# Patient Record
Sex: Female | Born: 1960 | Race: White | Hispanic: No | Marital: Single | Smoking: Never smoker
Health system: Southern US, Community
[De-identification: ages and names within clinical notes are randomized; demographics above are authoritative.]

## PROBLEM LIST (undated history)

## (undated) DIAGNOSIS — G459 Transient cerebral ischemic attack, unspecified: Secondary | ICD-10-CM

## (undated) DIAGNOSIS — E119 Type 2 diabetes mellitus without complications: Secondary | ICD-10-CM

## (undated) DIAGNOSIS — E785 Hyperlipidemia, unspecified: Secondary | ICD-10-CM

## (undated) DIAGNOSIS — K219 Gastro-esophageal reflux disease without esophagitis: Secondary | ICD-10-CM

## (undated) DIAGNOSIS — F419 Anxiety disorder, unspecified: Secondary | ICD-10-CM

## (undated) DIAGNOSIS — I10 Essential (primary) hypertension: Secondary | ICD-10-CM

## (undated) DIAGNOSIS — F319 Bipolar disorder, unspecified: Secondary | ICD-10-CM

## (undated) HISTORY — DX: Bipolar disorder, unspecified: F31.9

## (undated) HISTORY — DX: Essential (primary) hypertension: I10

## (undated) HISTORY — PX: HERNIA REPAIR: SHX51

## (undated) HISTORY — DX: Type 2 diabetes mellitus without complications: E11.9

## (undated) HISTORY — PX: ABDOMINAL HYSTERECTOMY: SHX81

## (undated) HISTORY — PX: APPENDECTOMY: SHX54

## (undated) HISTORY — PX: RIGHT OOPHORECTOMY: SHX2359

---

## 1999-04-21 ENCOUNTER — Emergency Department (HOSPITAL_COMMUNITY): Admission: EM | Admit: 1999-04-21 | Discharge: 1999-04-21 | Payer: Self-pay | Admitting: Emergency Medicine

## 1999-04-21 ENCOUNTER — Encounter: Payer: Self-pay | Admitting: Emergency Medicine

## 1999-07-17 ENCOUNTER — Emergency Department (HOSPITAL_COMMUNITY): Admission: EM | Admit: 1999-07-17 | Discharge: 1999-07-17 | Payer: Self-pay | Admitting: Emergency Medicine

## 1999-07-17 ENCOUNTER — Encounter: Payer: Self-pay | Admitting: Emergency Medicine

## 2000-01-29 ENCOUNTER — Emergency Department (HOSPITAL_COMMUNITY): Admission: EM | Admit: 2000-01-29 | Discharge: 2000-01-29 | Payer: Self-pay | Admitting: Emergency Medicine

## 2000-01-30 ENCOUNTER — Emergency Department (HOSPITAL_COMMUNITY): Admission: EM | Admit: 2000-01-30 | Discharge: 2000-01-30 | Payer: Self-pay | Admitting: Emergency Medicine

## 2000-02-01 ENCOUNTER — Emergency Department (HOSPITAL_COMMUNITY): Admission: EM | Admit: 2000-02-01 | Discharge: 2000-02-01 | Payer: Self-pay | Admitting: *Deleted

## 2000-02-06 ENCOUNTER — Emergency Department (HOSPITAL_COMMUNITY): Admission: EM | Admit: 2000-02-06 | Discharge: 2000-02-06 | Payer: Self-pay | Admitting: Emergency Medicine

## 2001-01-25 ENCOUNTER — Other Ambulatory Visit: Admission: RE | Admit: 2001-01-25 | Discharge: 2001-01-25 | Payer: Self-pay | Admitting: Obstetrics and Gynecology

## 2001-02-03 ENCOUNTER — Ambulatory Visit (HOSPITAL_COMMUNITY): Admission: RE | Admit: 2001-02-03 | Discharge: 2001-02-03 | Payer: Self-pay | Admitting: Obstetrics and Gynecology

## 2001-05-08 ENCOUNTER — Emergency Department (HOSPITAL_COMMUNITY): Admission: EM | Admit: 2001-05-08 | Discharge: 2001-05-09 | Payer: Self-pay

## 2002-02-18 ENCOUNTER — Emergency Department (HOSPITAL_COMMUNITY): Admission: EM | Admit: 2002-02-18 | Discharge: 2002-02-18 | Payer: Self-pay | Admitting: Emergency Medicine

## 2002-02-18 ENCOUNTER — Encounter: Payer: Self-pay | Admitting: Emergency Medicine

## 2002-12-05 ENCOUNTER — Emergency Department (HOSPITAL_COMMUNITY): Admission: EM | Admit: 2002-12-05 | Discharge: 2002-12-05 | Payer: Self-pay | Admitting: Emergency Medicine

## 2003-05-19 ENCOUNTER — Encounter: Payer: Self-pay | Admitting: Emergency Medicine

## 2003-05-19 ENCOUNTER — Emergency Department (HOSPITAL_COMMUNITY): Admission: EM | Admit: 2003-05-19 | Discharge: 2003-05-19 | Payer: Self-pay | Admitting: *Deleted

## 2003-10-11 ENCOUNTER — Inpatient Hospital Stay (HOSPITAL_COMMUNITY): Admission: EM | Admit: 2003-10-11 | Discharge: 2003-10-20 | Payer: Self-pay | Admitting: Psychiatry

## 2003-10-13 ENCOUNTER — Encounter: Payer: Self-pay | Admitting: Family Medicine

## 2003-11-27 ENCOUNTER — Ambulatory Visit (HOSPITAL_COMMUNITY): Admission: RE | Admit: 2003-11-27 | Discharge: 2003-11-27 | Payer: Self-pay | Admitting: Internal Medicine

## 2004-01-10 ENCOUNTER — Ambulatory Visit (HOSPITAL_COMMUNITY): Admission: RE | Admit: 2004-01-10 | Discharge: 2004-01-10 | Payer: Self-pay | Admitting: Internal Medicine

## 2004-08-14 ENCOUNTER — Inpatient Hospital Stay (HOSPITAL_COMMUNITY): Admission: AD | Admit: 2004-08-14 | Discharge: 2004-08-16 | Payer: Self-pay | Admitting: Obstetrics and Gynecology

## 2004-09-02 ENCOUNTER — Emergency Department (HOSPITAL_COMMUNITY): Admission: EM | Admit: 2004-09-02 | Discharge: 2004-09-02 | Payer: Self-pay | Admitting: Emergency Medicine

## 2004-09-25 ENCOUNTER — Ambulatory Visit (HOSPITAL_COMMUNITY): Admission: RE | Admit: 2004-09-25 | Discharge: 2004-09-25 | Payer: Self-pay | Admitting: Family Medicine

## 2005-01-09 ENCOUNTER — Ambulatory Visit: Payer: Self-pay | Admitting: Internal Medicine

## 2005-01-28 ENCOUNTER — Ambulatory Visit: Payer: Self-pay | Admitting: Internal Medicine

## 2005-03-10 ENCOUNTER — Ambulatory Visit: Payer: Self-pay | Admitting: Internal Medicine

## 2005-04-09 ENCOUNTER — Ambulatory Visit: Payer: Self-pay | Admitting: Psychiatry

## 2005-04-09 ENCOUNTER — Inpatient Hospital Stay (HOSPITAL_COMMUNITY): Admission: RE | Admit: 2005-04-09 | Discharge: 2005-04-13 | Payer: Self-pay | Admitting: Psychiatry

## 2005-08-25 ENCOUNTER — Ambulatory Visit: Payer: Self-pay | Admitting: Pulmonary Disease

## 2006-06-25 ENCOUNTER — Ambulatory Visit: Payer: Self-pay | Admitting: Internal Medicine

## 2007-03-15 ENCOUNTER — Inpatient Hospital Stay (HOSPITAL_COMMUNITY): Admission: AD | Admit: 2007-03-15 | Discharge: 2007-03-17 | Payer: Self-pay | Admitting: Cardiology

## 2007-03-15 ENCOUNTER — Ambulatory Visit: Payer: Self-pay | Admitting: Cardiology

## 2007-03-15 ENCOUNTER — Ambulatory Visit: Payer: Self-pay

## 2007-03-16 ENCOUNTER — Ambulatory Visit: Payer: Self-pay | Admitting: Cardiology

## 2007-03-16 ENCOUNTER — Encounter: Payer: Self-pay | Admitting: Cardiology

## 2007-04-06 ENCOUNTER — Ambulatory Visit: Payer: Self-pay | Admitting: Internal Medicine

## 2009-05-21 ENCOUNTER — Ambulatory Visit (HOSPITAL_COMMUNITY): Payer: Self-pay | Admitting: Psychiatry

## 2009-06-06 ENCOUNTER — Ambulatory Visit (HOSPITAL_COMMUNITY): Payer: Self-pay | Admitting: Psychiatry

## 2009-06-15 ENCOUNTER — Ambulatory Visit (HOSPITAL_COMMUNITY): Payer: Self-pay | Admitting: Psychiatry

## 2009-06-20 ENCOUNTER — Ambulatory Visit (HOSPITAL_COMMUNITY): Payer: Self-pay | Admitting: Psychiatry

## 2009-07-06 ENCOUNTER — Ambulatory Visit (HOSPITAL_COMMUNITY): Payer: Self-pay | Admitting: Psychiatry

## 2009-08-23 ENCOUNTER — Ambulatory Visit (HOSPITAL_COMMUNITY): Payer: Self-pay | Admitting: Psychiatry

## 2009-09-17 ENCOUNTER — Ambulatory Visit (HOSPITAL_COMMUNITY): Payer: Self-pay | Admitting: Psychiatry

## 2009-10-24 ENCOUNTER — Ambulatory Visit (HOSPITAL_COMMUNITY): Payer: Self-pay | Admitting: Psychiatry

## 2009-11-21 ENCOUNTER — Ambulatory Visit (HOSPITAL_COMMUNITY): Payer: Self-pay | Admitting: Psychiatry

## 2010-01-23 ENCOUNTER — Ambulatory Visit (HOSPITAL_COMMUNITY): Payer: Self-pay | Admitting: Psychiatry

## 2010-02-13 ENCOUNTER — Ambulatory Visit (HOSPITAL_COMMUNITY): Payer: Self-pay | Admitting: Licensed Clinical Social Worker

## 2010-03-11 ENCOUNTER — Ambulatory Visit (HOSPITAL_COMMUNITY): Payer: Self-pay | Admitting: Licensed Clinical Social Worker

## 2010-03-11 ENCOUNTER — Ambulatory Visit: Payer: Self-pay | Admitting: Occupational Medicine

## 2010-03-11 DIAGNOSIS — E119 Type 2 diabetes mellitus without complications: Secondary | ICD-10-CM | POA: Insufficient documentation

## 2010-03-11 DIAGNOSIS — E785 Hyperlipidemia, unspecified: Secondary | ICD-10-CM | POA: Insufficient documentation

## 2010-03-11 DIAGNOSIS — M799 Soft tissue disorder, unspecified: Secondary | ICD-10-CM | POA: Insufficient documentation

## 2010-03-28 ENCOUNTER — Ambulatory Visit: Payer: Self-pay | Admitting: Family Medicine

## 2010-03-28 DIAGNOSIS — IMO0001 Reserved for inherently not codable concepts without codable children: Secondary | ICD-10-CM | POA: Insufficient documentation

## 2010-04-11 ENCOUNTER — Ambulatory Visit (HOSPITAL_COMMUNITY): Payer: Self-pay | Admitting: Licensed Clinical Social Worker

## 2010-05-13 ENCOUNTER — Ambulatory Visit (HOSPITAL_COMMUNITY): Payer: Self-pay | Admitting: Licensed Clinical Social Worker

## 2010-05-25 ENCOUNTER — Ambulatory Visit: Payer: Self-pay | Admitting: Family Medicine

## 2010-05-25 DIAGNOSIS — S335XXA Sprain of ligaments of lumbar spine, initial encounter: Secondary | ICD-10-CM | POA: Insufficient documentation

## 2010-06-17 ENCOUNTER — Ambulatory Visit: Payer: Self-pay | Admitting: Internal Medicine

## 2010-06-17 DIAGNOSIS — J40 Bronchitis, not specified as acute or chronic: Secondary | ICD-10-CM | POA: Insufficient documentation

## 2010-06-18 ENCOUNTER — Telehealth (INDEPENDENT_AMBULATORY_CARE_PROVIDER_SITE_OTHER): Payer: Self-pay | Admitting: *Deleted

## 2010-06-19 DIAGNOSIS — K219 Gastro-esophageal reflux disease without esophagitis: Secondary | ICD-10-CM | POA: Insufficient documentation

## 2010-06-19 DIAGNOSIS — J309 Allergic rhinitis, unspecified: Secondary | ICD-10-CM | POA: Insufficient documentation

## 2010-06-27 ENCOUNTER — Ambulatory Visit: Payer: Self-pay | Admitting: Emergency Medicine

## 2010-06-27 DIAGNOSIS — R059 Cough, unspecified: Secondary | ICD-10-CM | POA: Insufficient documentation

## 2010-06-27 DIAGNOSIS — R05 Cough: Secondary | ICD-10-CM

## 2010-07-11 ENCOUNTER — Ambulatory Visit (HOSPITAL_COMMUNITY): Payer: Self-pay | Admitting: Licensed Clinical Social Worker

## 2010-08-05 ENCOUNTER — Ambulatory Visit (HOSPITAL_COMMUNITY): Payer: Self-pay | Admitting: Licensed Clinical Social Worker

## 2010-08-26 ENCOUNTER — Ambulatory Visit: Payer: Self-pay | Admitting: Emergency Medicine

## 2010-08-27 ENCOUNTER — Encounter: Payer: Self-pay | Admitting: Emergency Medicine

## 2010-09-10 ENCOUNTER — Ambulatory Visit (HOSPITAL_COMMUNITY): Payer: Self-pay | Admitting: Licensed Clinical Social Worker

## 2010-09-23 ENCOUNTER — Ambulatory Visit: Payer: Self-pay | Admitting: Family Medicine

## 2010-09-23 DIAGNOSIS — M25569 Pain in unspecified knee: Secondary | ICD-10-CM | POA: Insufficient documentation

## 2010-09-23 DIAGNOSIS — M25529 Pain in unspecified elbow: Secondary | ICD-10-CM | POA: Insufficient documentation

## 2010-09-23 DIAGNOSIS — M25519 Pain in unspecified shoulder: Secondary | ICD-10-CM | POA: Insufficient documentation

## 2010-09-23 DIAGNOSIS — M25539 Pain in unspecified wrist: Secondary | ICD-10-CM | POA: Insufficient documentation

## 2010-09-23 DIAGNOSIS — S93409A Sprain of unspecified ligament of unspecified ankle, initial encounter: Secondary | ICD-10-CM | POA: Insufficient documentation

## 2010-09-25 ENCOUNTER — Telehealth (INDEPENDENT_AMBULATORY_CARE_PROVIDER_SITE_OTHER): Payer: Self-pay | Admitting: *Deleted

## 2010-10-09 ENCOUNTER — Ambulatory Visit (HOSPITAL_COMMUNITY): Payer: Self-pay | Admitting: Licensed Clinical Social Worker

## 2010-11-05 ENCOUNTER — Ambulatory Visit (HOSPITAL_COMMUNITY): Payer: Self-pay | Admitting: Licensed Clinical Social Worker

## 2010-12-07 ENCOUNTER — Encounter: Payer: Self-pay | Admitting: Internal Medicine

## 2010-12-07 ENCOUNTER — Encounter: Payer: Self-pay | Admitting: Psychiatry

## 2010-12-08 ENCOUNTER — Encounter: Payer: Self-pay | Admitting: Internal Medicine

## 2010-12-10 ENCOUNTER — Ambulatory Visit (HOSPITAL_COMMUNITY)
Admission: RE | Admit: 2010-12-10 | Discharge: 2010-12-10 | Payer: Self-pay | Source: Home / Self Care | Attending: Licensed Clinical Social Worker | Admitting: Licensed Clinical Social Worker

## 2010-12-19 NOTE — Assessment & Plan Note (Signed)
Summary: LBP x last night rm 3   Vital Signs:  Patient Profile:   50 Years Old Female CC:      LBP x this am Height:     69 inches Weight:      188 pounds O2 Sat:      100 % O2 treatment:    Room Air Temp:     98.8 degrees F oral Pulse rhythm:   regular Resp:     16 per minute (right arm) Cuff size:   regular  Vitals Entered By: Areta Haber CMA (May 25, 2010 12:06 PM)                  Current Allergies: ! MORPHINE SULFATE (MORPHINE SULFATE) ! SULFA ! PERCOCET (OXYCODONE-ACETAMINOPHEN) ! ULTRAM (TRAMADOL HCL) ! DARVOCET ! DARVOCET ! DARVON     History of Present Illness Chief Complaint: LBP x this am History of Present Illness:  Subjective:  While bending over a clothes basket last night, patient felt sudden sharp pain in her mid-lower back when she stood up.  The pain persisted through the night.  It is worse with movement but does not radiate.  No bowel or bladder dysfunction.  No saddle numbness.  No fevers, chills, and sweats.  Current Problems: LUMBAR STRAIN, ACUTE (ICD-847.2) MYALGIA (ICD-729.1) MUSCULOSKELETAL PAIN (ICD-781.99) HYPERLIPIDEMIA (ICD-272.4) DIABETES MELLITUS, TYPE II (ICD-250.00)   Current Meds ACTOS 30 MG TABS (PIOGLITAZONE HCL) once daily XANAX 2 MG TABS (ALPRAZOLAM) 4 times daily LIPITOR 40 MG TABS (ATORVASTATIN CALCIUM) qhs VICODIN ES 7.5-750 MG TABS (HYDROCODONE-ACETAMINOPHEN) 1 q4h prn TRAZODONE HCL 100 MG TABS (TRAZODONE HCL) qhs ATROVENT HFA 17 MCG/ACT AERS (IPRATROPIUM BROMIDE HFA) as needed KETOROLAC TROMETHAMINE 10 MG TABS (KETOROLAC TROMETHAMINE) One by mouth q4 to 6hr as needed (max 4/day) SKELAXIN 800 MG TABS (METAXALONE) 1 by mouth hs  REVIEW OF SYSTEMS Constitutional Symptoms      Denies fever, chills, night sweats, weight loss, weight gain, and fatigue.  Eyes       Denies change in vision, eye pain, eye discharge, glasses, contact lenses, and eye surgery. Ear/Nose/Throat/Mouth       Denies hearing loss/aids,  change in hearing, ear pain, ear discharge, dizziness, frequent runny nose, frequent nose bleeds, sinus problems, sore throat, hoarseness, and tooth pain or bleeding.  Respiratory       Denies dry cough, productive cough, wheezing, shortness of breath, asthma, bronchitis, and emphysema/COPD.  Cardiovascular       Denies murmurs, chest pain, and tires easily with exhertion.    Gastrointestinal       Denies stomach pain, nausea/vomiting, diarrhea, constipation, blood in bowel movements, and indigestion. Genitourniary       Denies painful urination, kidney stones, and loss of urinary control. Neurological       Denies paralysis, seizures, and fainting/blackouts. Musculoskeletal       Complains of decreased range of motion.      Denies muscle pain, joint pain, joint stiffness, redness, swelling, muscle weakness, and gout.      Comments: LBP x last night Skin       Denies bruising, unusual mles/lumps or sores, and hair/skin or nail changes.  Psych       Denies mood changes, temper/anger issues, anxiety/stress, speech problems, depression, and sleep problems. Other Comments: Pt states she was trying t get top from bin in her closet, felt pain in her lower back. Pt states she is out of her Vicodin medication for pain, pt states she will be  seeing her PCP on Firday 05/31/10. Pt states she is not being followed by a Pain clinic either.   Past History:  Past Medical History: Last updated: 03/11/2010 Diabetes mellitus, type II Hyperlipidemia  Past Surgical History: Last updated: 03/11/2010 Appendectomy partial hysterectomy  Family History: Last updated: 03/11/2010 none  Social History: Last updated: 03/11/2010 Single-engaged Never Smoked Alcohol use-no Drug use-no Regular exercise-no  Risk Factors: Exercise: no (03/11/2010)  Risk Factors: Smoking Status: never (03/11/2010)   Objective:  No acute distress  Neck:  Supple Lungs:  Clear to auscultation.  Breath sounds are  equal.  Heart:  Regular rate and rhythm without murmurs, rubs, or gallops.  Abdomen:  Nontender without masses or hepatosplenomegaly.  Bowel sounds are present.  No CVA or flank tenderness.   Back:   Can heel/toe walk and squat without difficulty.   Tenderness in the midline and bilateral paraspinous muscles from L3 to Sacral area.  Straight leg raising test is negative.  Sitting knee extension test is negative.  Strength and sensation in the lower extremities is normal.  Patellar and achilles reflexes are normal.  Skin:  No rash Assessment New Problems: LUMBAR STRAIN, ACUTE (ICD-847.2)   Plan New Medications/Changes: SKELAXIN 800 MG TABS (METAXALONE) 1 by mouth hs  #8 (eight) x 0, 05/25/2010, Donna Christen MD KETOROLAC TROMETHAMINE 10 MG TABS (KETOROLAC TROMETHAMINE) One by mouth q4 to 6hr as needed (max 4/day)  #16 x 0, 05/25/2010, Donna Christen MD CYCLOBENZAPRINE HCL 10 MG TABS (CYCLOBENZAPRINE HCL) One tab by mouth HS prn  #7 (seven) x 0, 05/25/2010, Donna Christen MD KETOROLAC TROMETHAMINE 10 MG TABS (KETOROLAC TROMETHAMINE) One by mouth q4 to 6hr as needed (max 4/day)  #12 (twelve) x 0, 05/25/2010, Donna Christen MD  New Orders: Ketorolac-Toradol 15mg  [Z6109] Admin of Therapeutic Inj  intramuscular or subcutaneous [96372] Est. Patient Level III [[address]] Planning Comments:   Begin applying ice pack several times daily. Toradol 60mg  IM.  May continue oral Toradol for 4 more days.  Skelaxin at bedtime. Begin back range of motion exercises ([facility] information and instruction patient handout given)  Follow-up with PCP as scheduled.   The patient and/or caregiver has been counseled thoroughly with regard to medications prescribed including dosage, schedule, interactions, rationale for use, and possible side effects and they verbalize understanding.  Diagnoses and expected course of recovery discussed and will return if not improved as expected or if the condition worsens. Patient and/or  caregiver verbalized understanding.  Prescriptions: SKELAXIN 800 MG TABS (METAXALONE) 1 by mouth hs  #8 (eight) x 0   Entered and Authorized by:   Donna Christen MD   Signed by:   Donna Christen MD on 05/25/2010   Method used:   Print then Give to Patient   RxID:   [id number] KETOROLAC TROMETHAMINE 10 MG TABS (KETOROLAC TROMETHAMINE) One by mouth q4 to 6hr as needed (max 4/day)  #16 x 0   Entered and Authorized by:   Donna Christen MD   Signed by:   Donna Christen MD on 05/25/2010   Method used:   Print then Give to Patient   RxID:   [id number] CYCLOBENZAPRINE HCL 10 MG TABS (CYCLOBENZAPRINE HCL) One tab by mouth HS prn  #7 (seven) x 0   Entered and Authorized by:   Donna Christen MD   Signed by:   Donna Christen MD on 05/25/2010   Method used:   Print then Give to Patient   RxID:   [id number] KETOROLAC TROMETHAMINE 10 MG TABS (KETOROLAC TROMETHAMINE)  One by mouth q4 to 6hr as needed (max 4/day)  #12 (twelve) x 0   Entered and Authorized by:   Donna Christen MD   Signed by:   Donna Christen MD on 05/25/2010   Method used:   Print then Give to Patient   RxID:   [id number]   Medication Administration  Injection # 1:    Medication: Ketorolac-Toradol 15mg     Diagnosis: LUMBAR STRAIN, ACUTE (ICD-847.2)    Route: IM    Site: RUOQ gluteus    Exp Date: 08/18/2011    Lot #: [id number]    Mfr: Hospira    Comments: Administered 60 mg    Patient tolerated injection without complications    Given by: Areta Haber CMA (May 25, 2010 12:53 PM)  Orders Added: 1)  Ketorolac-Toradol 15mg  [J1885] 2)  Admin of Therapeutic Inj  intramuscular or subcutaneous [96372] 3)  Est. Patient Level III [62130]   Medication Administration  Injection # 1:    Medication: Ketorolac-Toradol 15mg     Diagnosis: LUMBAR STRAIN, ACUTE (ICD-847.2)    Route: IM    Site: RUOQ gluteus    Exp Date: 08/18/2011    Lot #: [id number]    Mfr: Hospira    Comments: Administered 60 mg    Patient  tolerated injection without complications    Given by: Areta Haber CMA (May 25, 2010 12:53 PM)  Orders Added: 1)  Ketorolac-Toradol 15mg  [J1885] 2)  Admin of Therapeutic Inj  intramuscular or subcutaneous [96372] 3)  Est. Patient Level III [96295]

## 2010-12-19 NOTE — Assessment & Plan Note (Signed)
Summary: BACK PAIN   Vital Signs:  Patient Profile:   50 Years Old Female CC:      lower back and left extremities pain, HA, N/V post fall yesterday  Height:     69 inches Weight:      160 pounds O2 Sat:      97 % O2 treatment:    Room Air Temp:     97.2 degrees F oral Pulse rate:   65 / minute Pulse rhythm:   regular Resp:     18 per minute BP sitting:   113 / 74  (left arm) Cuff size:   regular  Pt. in pain?   yes    Location:   lower back and left extremities    Intensity:   10    Type:       sharp  Vitals Entered By: Lajean Saver RN (March 11, 2010 3:14 PM)                   Updated Prior Medication List: ACTOS 30 MG TABS (PIOGLITAZONE HCL) once daily XANAX 2 MG TABS (ALPRAZOLAM) 4 times daily LIPITOR 40 MG TABS (ATORVASTATIN CALCIUM) qhs VICODIN ES 7.5-750 MG TABS (HYDROCODONE-ACETAMINOPHEN) 1 q4h prn PHENERGAN 25 MG/ML SOLN (PROMETHAZINE HCL) prn TRAZODONE HCL 100 MG TABS (TRAZODONE HCL) qhs  Current Allergies: ! MORPHINE SULFATE (MORPHINE SULFATE) ! SULFA ! PERCOCET (OXYCODONE-ACETAMINOPHEN) ! ULTRAM (TRAMADOL HCL) ! DARVOCET ! DARVOCETHistory of Present Illness Chief Complaint: Larey Seat yesterday  History of Present Illness: Yesterday she accidentally fell backwards on a gravel driveway.  Landed on her left side.   No history of head injury or trauma.   Presents with Complaints of mid and low back pain.  I had her fill out a PAIN DIAGRAM and it it attached to the visit.   she indicates she has pain in her left left leg to the ankle and left hip and left arm from shoulder to elbow.    She has been taking ibuprofen 800mg  that she had at home.  This upset her stomach.   Hot showers feel good.  Denies history of fibromyalgia or chronic back pain or other chronic pain.    REVIEW OF SYSTEMS Constitutional Symptoms      Denies fever, chills, night sweats, weight loss, weight gain, and fatigue.  Eyes       Denies change in vision, eye pain, eye discharge, glasses,  contact lenses, and eye surgery. Ear/Nose/Throat/Mouth       Denies hearing loss/aids, change in hearing, ear pain, ear discharge, dizziness, frequent runny nose, frequent nose bleeds, sinus problems, sore throat, hoarseness, and tooth pain or bleeding.  Respiratory       Denies dry cough, productive cough, wheezing, shortness of breath, asthma, bronchitis, and emphysema/COPD.  Cardiovascular       Denies murmurs, chest pain, and tires easily with exhertion.    Gastrointestinal       Complains of nausea/vomiting.      Denies stomach pain, diarrhea, constipation, blood in bowel movements, and indigestion.      Comments: X last night Genitourniary       Denies painful urination, kidney stones, and loss of urinary control. Neurological       Complains of headaches.      Denies paralysis, seizures, and fainting/blackouts. Musculoskeletal       Complains of muscle pain and joint stiffness.      Denies joint pain, decreased range of motion, redness, swelling, muscle weakness, and gout.  Comments: lower back, left extremities Skin       Denies bruising, unusual mles/lumps or sores, and hair/skin or nail changes.  Psych       Denies mood changes, temper/anger issues, anxiety/stress, speech problems, depression, and sleep problems. Other Comments: patient was getting objects out of a trunk yesterday, stepped back and lost her balance falling. She landed on her left side and hitting the left side of her face.   Past History:  Past Medical History: Diabetes mellitus, type II Hyperlipidemia  Past Surgical History: Appendectomy partial hysterectomy  Family History: none  Social History: Single-engaged Never Smoked Alcohol use-no Drug use-no Regular exercise-no Smoking Status:  never Drug Use:  no Does Patient Exercise:  no Physical Exam General appearance: Sad, dysphoric affect.   Walks with a slow shuffling gait.   Very awkward transitions.  Back: tender musculature bilateral  lower back, straight leg raises negative bilaterally, deep tendon reflexes 2+ at achilles and patella Many pain behaviors noted.  No bruising noted on entire back, buttocks, legs and arms tender bilateral traps, upper back, mid back and lower back.  Assessment New Problems: MUSCULOSKELETAL PAIN (ICD-781.99) HYPERLIPIDEMIA (ICD-272.4) DIABETES MELLITUS, TYPE II (ICD-250.00)   Plan New Medications/Changes: PROMETHAZINE HCL 25 MG  TABS (PROMETHAZINE HCL) one every 6 hours as needed for nausea  #15 x 0, 03/11/2010, Kathrine Haddock MD CYCLOBENZAPRINE HCL 10 MG  TABS (CYCLOBENZAPRINE HCL) 1 by mouth 2 times daily as needed for back pain  #20 x 0, 03/11/2010, Kathrine Haddock MD MELOXICAM 15 MG TABS (MELOXICAM) one by mouth daily  #30 x 0, 03/11/2010, Kathrine Haddock MD  New Orders: New Patient Level III [phone] Planning Comments:   Recommend maintaining normal activity as much as possible\par No indications for imaging now Gave Toradol 60mg  IM now and 4 mg zofran by mouth Phenegran as needed for nausea mobic daily as directed flexeril as directed Follow up with PCP in the next 1-2 weeks.   The patient and/or caregiver has been counseled thoroughly with regard to medications prescribed including dosage, schedule, interactions, rationale for use, and possible side effects and they verbalize understanding.  Diagnoses and expected course of recovery discussed and will return if not improved as expected or if the condition worsens. Patient and/or caregiver verbalized understanding.  Prescriptions: PROMETHAZINE HCL 25 MG  TABS (PROMETHAZINE HCL) one every 6 hours as needed for nausea  #15 x 0   Entered and Authorized by:   Kathrine Haddock MD   Signed by:   Kathrine Haddock MD on 03/11/2010   Method used:   Print then Give to Patient   RxID:   [id number] CYCLOBENZAPRINE HCL 10 MG  TABS (CYCLOBENZAPRINE HCL) 1 by mouth 2 times daily as needed for back pain  #20 x 0   Entered and Authorized by:    Kathrine Haddock MD   Signed by:   Kathrine Haddock MD on 03/11/2010   Method used:   Print then Give to Patient   RxID:   [id number] MELOXICAM 15 MG TABS (MELOXICAM) one by mouth daily  #30 x 0   Entered and Authorized by:   Kathrine Haddock MD   Signed by:   Kathrine Haddock MD on 03/11/2010   Method used:   Print then Give to Patient   RxID:   [id number]   Appended Document: BACK PAIN 60mg  Ketorolac given right glute LOT# [id number] Exp: 06/18/2011 Patient tolerated without complications

## 2010-12-19 NOTE — Assessment & Plan Note (Signed)
Summary: coughing/vommiting   Vital Signs:  Patient Profile:   50 Years Old Female CC:      Cough, vomiting, sneezing, sore throat x  5 days Height:     69 inches Weight:      182 pounds O2 Sat:      96 % O2 treatment:    Room Air Temp:     98.9 degrees F oral Pulse rate:   65 / minute Pulse rhythm:   regular Resp:     20 per minute BP sitting:   112 / 74  (right arm) Cuff size:   regular  Vitals Entered By: Emilio Math (June 27, 2010 11:05 AM)                  Current Allergies (reviewed today): ! MORPHINE SULFATE (MORPHINE SULFATE) ! SULFA ! PERCOCET (OXYCODONE-ACETAMINOPHEN) ! ULTRAM (TRAMADOL HCL) ! DARVOCET ! DARVON ! * PEPTO ! * XRAY DYES ! CIPRO ! * LORCETHistory of Present Illness Chief Complaint: Cough, vomiting, sneezing, sore throat x  5 days History of Present Illness: 50 yo F here for URI symptoms.  Patient reports on 8/5 she had esophageal dilatation procedure for stricture.  About that time started to develop a cough, fatigue, runny nose, postnasal drip, sore throat.  Has had some posttussive emesis as well.  No blood in vomitus.  Tried benadryl only.  Feels more short of breath.  No chest pain currently.  Sometimes hurts to take a deep breath.  Current Meds ACTOS 30 MG TABS (PIOGLITAZONE HCL) once daily XANAX 2 MG TABS (ALPRAZOLAM) 4 times daily LIPITOR 40 MG TABS (ATORVASTATIN CALCIUM) qhs VICODIN ES 7.5-750 MG TABS (HYDROCODONE-ACETAMINOPHEN) 1 q4h prn TRAZODONE HCL 100 MG TABS (TRAZODONE HCL) qhs LIDODERM 5 % PTCH (LIDOCAINE) 2 applied at night FUROSEMIDE 40 MG TABS (FUROSEMIDE) take 1 by mouth as needed edema PREVACID 30 MG CPDR (LANSOPRAZOLE) take 1 by mouth once daily PROMETHAZINE HCL 25 MG TABS (PROMETHAZINE HCL) take 1 by mouth every 6 hours as needed METFORMIN HCL 500 MG TABS (METFORMIN HCL) take 1 by mouth fi sugar is over 150 DULERA 200-5 MCG/ACT AERO (MOMETASONE FURO-FORMOTEROL FUM) 2 puffs and rinse mouth two times a day TESSALON  PERLES 100 MG CAPS (BENZONATATE) 1 cap by mouth three times a day as needed cough AZITHROMYCIN 250 MG TABS (AZITHROMYCIN) 2 tabs by mouth day 1, 1 tab by mouth days 2-5  REVIEW OF SYSTEMS Constitutional Symptoms       Complains of fever, chills, and night sweats.     Denies weight loss, weight gain, and fatigue.  Eyes       Complains of glasses.      Denies change in vision, eye pain, eye discharge, contact lenses, and eye surgery. Ear/Nose/Throat/Mouth       Complains of frequent runny nose, sinus problems, and sore throat.      Denies hearing loss/aids, change in hearing, ear pain, ear discharge, dizziness, frequent nose bleeds, hoarseness, and tooth pain or bleeding.  Respiratory       Complains of productive cough, wheezing, shortness of breath, asthma, and bronchitis.      Denies dry cough and emphysema/COPD.  Cardiovascular       Denies murmurs, chest pain, and tires easily with exhertion.    Gastrointestinal       Complains of nausea/vomiting.      Denies stomach pain, diarrhea, constipation, blood in bowel movements, and indigestion. Genitourniary       Denies painful urination,  kidney stones, and loss of urinary control. Neurological       Complains of headaches.      Denies paralysis, seizures, and fainting/blackouts. Musculoskeletal       Denies muscle pain, joint pain, joint stiffness, decreased range of motion, redness, swelling, muscle weakness, and gout.  Skin       Denies bruising, unusual mles/lumps or sores, and hair/skin or nail changes.      Comments: Right wrist x 5 days Psych       Denies mood changes, temper/anger issues, anxiety/stress, speech problems, depression, and sleep problems.  Past History:  Past Medical History: Reviewed history from 06/17/2010 and no changes required. Diabetes mellitus, type II Hyperlipidemia Bipolar Hx Major Depression Allergic Rhinitis Bronchitis G [facility] Musculoskeletal pain Irregular heart beat  Past Surgical  History: Appendectomy partial hysterectomy, adhesions - multiple surgeries Mouth  Family History: Reviewed history from 06/17/2010 and no changes required. Family Hx: asthma, heart disease,cancer Mother- died 66, COPD, DM Father- died colon cancer [place]- died age 4  Social History: Reviewed history from 06/17/2010 and no changes required. Single-engaged. Children grown Never Smoked Alcohol use-no Drug use-no Regular exercise-no Disability Physical Exam General appearance: well developed, well nourished, no acute distress Head: normocephalic, atraumatic, TTP bilateral frontal and maxillary sinuses Eyes: conjunctivae and lids normal Ears: normal, no lesions or deformities Nasal: clear discharge Oral/Pharynx: tongue normal, posterior pharynx without erythema or exudate Neck: neck supple,  trachea midline, no masses.  Tender ant cerv chain but no overt LAN. Chest/Lungs: mild rhonchi LUL.  No wheezes, rales.  No resp distress - speaking in sentences. Heart: regular rate and  rhythm, no murmur Skin: no obvious rashes or lesions Assessment New Problems: COUGH (ICD-786.2)   Patient Education: seen by Dr. Pearletha Forge  Plan New Medications/Changes: PROMETHAZINE HCL 25 MG TABS (PROMETHAZINE HCL) take 1 by mouth every 6 hours as needed  #30 x 0, 06/27/2010, Norton Blizzard MD AZITHROMYCIN 250 MG TABS (AZITHROMYCIN) 2 tabs by mouth day 1, 1 tab by mouth days 2-5  #6 x 0, 06/27/2010, Norton Blizzard MD TESSALON PERLES 100 MG CAPS (BENZONATATE) 1 cap by mouth three times a day as needed cough  #30 x 0, 06/27/2010, Norton Blizzard MD  New Orders: T-Chest x-ray, 2 views [[id number]] Est. Patient Level III [16109] Planning Comments:   Discussed likely viral but cover with bacterial causes for sinus and bronchitis with azithromycin.  Tessalon as needed cough.  Phenergan as needed nausea.  Call GI physician given her vomiting - believe this is related to her current illness and not related to  dilatation procedure.  F/u with PCP if not improving over next week.   The patient and/or caregiver has been counseled thoroughly with regard to medications prescribed including dosage, schedule, interactions, rationale for use, and possible side effects and they verbalize understanding.  Diagnoses and expected course of recovery discussed and will return if not improved as expected or if the condition worsens. Patient and/or caregiver verbalized understanding.  Prescriptions: PROMETHAZINE HCL 25 MG TABS (PROMETHAZINE HCL) take 1 by mouth every 6 hours as needed  #30 x 0   Entered and Authorized by:   Norton Blizzard MD   Signed by:   Norton Blizzard MD on 06/27/2010   Method used:   Electronically to        [facility]* (retail)       [address] [place], [place]  [address]  Ph: [phone]       Fax: [phone]   RxID:   [id number] AZITHROMYCIN 250 MG TABS (AZITHROMYCIN) 2 tabs by mouth day 1, 1 tab by mouth days 2-5  #6 x 0   Entered by:   Norton Blizzard MD   Authorized by:   Hoyt Koch MD   Signed by:   Norton Blizzard MD on 06/27/2010   Method used:   Electronically to        [facility]* (retail)       [address] [place], [place]  [address]       Ph: [phone]       Fax: [phone]   RxID:   [id number] TESSALON PERLES 100 MG CAPS (BENZONATATE) 1 cap by mouth three times a day as needed cough  #30 x 0   Entered by:   Norton Blizzard MD   Authorized by:   Hoyt Koch MD   Signed by:   Norton Blizzard MD on 06/27/2010   Method used:   Electronically to        [facility]* (retail)       [address] [place], [place]  [address]       Ph: [phone]       Fax: [phone]   RxID:   [id number]   Patient Instructions: 1)  Take full course of antibiotic until gone. 2)  Take tessalon perles as needed for cough. 3)  Take phenergan as needed for nausea. 4)  Call your GI doctor today (the one who stretched your  esophagus) to notify them you have been vomiting. 5)  Sudafed 60mg  every 6 hours, afrin nasal spray twice a day MAX 3 days, nasal saline rinses, and a humidifier may help with congestion and nasal drip. 6)  Follow up with your doctor if not improving over the next week.  Orders Added: 1)  T-Chest x-ray, 2 views [71020] 2)  Est. Patient Level III [62831]

## 2010-12-19 NOTE — Letter (Signed)
Summary: CONTOLLED MED PRESCIPTION POLICY  CONTOLLED MED PRESCIPTION POLICY   Imported By: Dannette Barbara 08/26/2010 10:21:38  _____________________________________________________________________  External Attachment:    Type:   Image     Comment:   External Document

## 2010-12-19 NOTE — Progress Notes (Signed)
  Phone Note Outgoing Call Call back at [facility] Phone [phone]   Call placed by: Lajean Saver RN,  September 25, 2010 12:34 PM Call placed to: Patient Summary of Call: Callback: No answer. Message left.

## 2010-12-19 NOTE — Assessment & Plan Note (Signed)
Summary: HEAD PAIN/BACK PAIN   Vital Signs:  Patient Profile:   50 Years Old Female CC:      vomitng X 2 days, back, shoulder, neck and bilateral leg painpost fall yesterday Height:     69 inches Weight:      177 pounds O2 Sat:      97 % O2 treatment:    Room Air Temp:     97.0 degrees F oral Pulse rate:   77 / minute Pulse rhythm:   regular Resp:     18 per minute BP sitting:   102 / 60  (right arm) Cuff size:   large  Pt. in pain?   yes    Location:   back and shoulders, legs    Intensity:   10    Type:       sharp  Vitals Entered By: Lajean Saver RN (Mar 28, 2010 2:14 PM)                   Updated Prior Medication List: ACTOS 30 MG TABS (PIOGLITAZONE HCL) once daily XANAX 2 MG TABS (ALPRAZOLAM) 4 times daily LIPITOR 40 MG TABS (ATORVASTATIN CALCIUM) qhs VICODIN ES 7.5-750 MG TABS (HYDROCODONE-ACETAMINOPHEN) 1 q4h prn PHENERGAN 25 MG/ML SOLN (PROMETHAZINE HCL) prn TRAZODONE HCL 100 MG TABS (TRAZODONE HCL) qhs CYCLOBENZAPRINE HCL 10 MG  TABS (CYCLOBENZAPRINE HCL) 1 by mouth 2 times daily as needed for back pain  Current Allergies (reviewed today): ! MORPHINE SULFATE (MORPHINE SULFATE) ! SULFA ! PERCOCET (OXYCODONE-ACETAMINOPHEN) ! ULTRAM (TRAMADOL HCL) ! DARVOCET ! DARVOCET  History of Present Illness Chief Complaint: vomitng X 2 days, back, shoulder, neck and bilateral leg painpost fall yesterday History of Present Illness: MULTIPLE COMPLAINTS. PATIENT STATES FOR 2 DAYS SHE HAS HAD NAUSEA WITH SOME VOMIITING. NO DIARRHEA, NO FEVER, NO BLOOD IN HER STOOLS OR EMISIS. STATES YESTERDAY SHE WAS TRYING TO GET SOMETHING OUT OF HER CLOSET AND LOST HER BALANCE. SHE INITIALLY FELL BACKWARDS AND HIT SOME STORAGE BEND AND THEN WENT FORWARD ONTO HER KNEES. AS SHE WAS GETTING UP FROM HER KNEES SHE FELL BACK AND LANDED ON HER BUTTOCKS. DENIES HEAD INJURY OR LOC. TODAYS SHE STATES SHE IS SORE ALL OVER. HAS SOME SHARP PAIN AT HER COCCYS WHICH SHE STATES SHE HAS BROKEN IN THE PAST.  STATES SHE CHECKED HER BLOOD SUGAR THIS AM AND IT WAS 147.   REVIEW OF SYSTEMS Constitutional Symptoms      Denies fever, chills, night sweats, weight loss, weight gain, and fatigue.  Eyes       Denies change in vision, eye pain, eye discharge, glasses, contact lenses, and eye surgery. Ear/Nose/Throat/Mouth       Denies hearing loss/aids, change in hearing, ear pain, ear discharge, dizziness, frequent runny nose, frequent nose bleeds, sinus problems, sore throat, hoarseness, and tooth pain or bleeding.  Respiratory       Denies dry cough, productive cough, wheezing, shortness of breath, asthma, bronchitis, and emphysema/COPD.  Cardiovascular       Denies murmurs, chest pain, and tires easily with exhertion.    Gastrointestinal       Complains of nausea/vomiting.      Denies stomach pain, diarrhea, constipation, blood in bowel movements, and indigestion.      Comments: X 2 days Genitourniary       Denies painful urination, kidney stones, and loss of urinary control. Neurological       Denies paralysis, seizures, and fainting/blackouts. Musculoskeletal       Complains  of muscle pain, joint pain, and joint stiffness.      Denies decreased range of motion, redness, swelling, muscle weakness, and gout.      Comments: back, neck, legs, shoulders Skin       Denies bruising, unusual mles/lumps or sores, and hair/skin or nail changes.  Psych       Denies mood changes, temper/anger issues, anxiety/stress, speech problems, depression, and sleep problems. Other Comments: patient states she lost her balance while in her closet yesterday and fell backwards hitting her knees first and then her buttocks   Past History:  Past Medical History: Reviewed history from 03/11/2010 and no changes required. Diabetes mellitus, type II Hyperlipidemia  Past Surgical History: Reviewed history from 03/11/2010 and no changes required. Appendectomy partial hysterectomy  Family History: Reviewed history  from 03/11/2010 and no changes required. none  Social History: Reviewed history from 03/11/2010 and no changes required. Single-engaged Never Smoked Alcohol use-no Drug use-no Regular exercise-no Physical Exam General appearance: well developed, well nourished, no acute distress. SPEECH SLOW. APPEARS SEDATED. TAKING 5 ZANAX PER DAY Head: normocephalic, atraumatic Neck: neck supple,  trachea midline, no masses Chest/Lungs: no rales, wheezes, or rhonchi bilateral, breath sounds equal without effort Heart: regular rate and  rhythm, no murmur Abdomen: soft, non-tender without obvious organomegaly Extremities: normal extremities Neurological: grossly intact and non-focal. CN 2-12 INTACT.  Back: SKIN CLEAR, NO DEFORMITY. NO BRUISING. ROM INTACT. NEG SLR, SENSORY INTACT.  Skin: no obvious rashes or lesions Assessment New Problems: MYALGIA (ICD-729.1)   Plan New Medications/Changes: ZOFRAN ODT 4 MG TBDP (ONDANSETRON) 1 by mouth Q 8 HRS as needed N/V  ##12 x 0, 03/28/2010, Marvis Moeller DO KETOROLAC TROMETHAMINE 10 MG TABS (KETOROLAC TROMETHAMINE) 1 by mouth QID as needed PAIN  ##20 x 0, 03/28/2010, Marvis Moeller DO  New Orders: Est. Patient Level III [99213] Ketorolac-Toradol 15mg  [J1885] Admin of Therapeutic Inj  intramuscular or subcutaneous [96372] Zofran 1mg . injection [J2405]   Prescriptions: ZOFRAN ODT 4 MG TBDP (ONDANSETRON) 1 by mouth Q 8 HRS as needed N/V  ##12 x 0   Entered and Authorized by:   Marvis Moeller DO   Signed by:   Marvis Moeller DO on 03/28/2010   Method used:   Print then Give to Patient   RxID:   [id number] KETOROLAC TROMETHAMINE 10 MG TABS (KETOROLAC TROMETHAMINE) 1 by mouth QID as needed PAIN  ##20 x 0   Entered and Authorized by:   Marvis Moeller DO   Signed by:   Marvis Moeller DO on 03/28/2010   Method used:   Print then Give to Patient   RxID:   [id number]   Patient Instructions: 1)  YOU WILL BE MORE SORE THE NEXT 48-72  HRS. APPLY HEAT THREE TIMES DAILY FOR 15 MIN. DIET AS DISCUSSED. AVOID CAFFEINE AND MILK PRODUCTS. MONITOR YOUR BLOOD SUGARS. FOLLOW UP WITH YOUR PCP IF SYMPTOMS PERSIST OR WORSEN.   Medication Administration  Injection # 1:    Medication: Ketorolac-Toradol 15mg     Diagnosis: MUSCULOSKELETAL PAIN (ICD-781.99)    Route: IM    Site: LUOQ gluteus    Exp Date: 02/15/2011    Lot #: [id number]    Mfr: [email or web address]    Comments: 30mg     Patient tolerated injection without complications    Given by: Lajean Saver RN (Mar 28, 2010 3:07 PM)  Injection # 2:    Medication: Zofran 1mg . injection    Diagnosis: MUSCULOSKELETAL PAIN (ICD-781.99)    Route: IM    Site: RUOQ  gluteus    Exp Date: 12/18/2011    Lot #: [id number]    Mfr: [email or web address]    Comments: 4mg  given    Patient tolerated injection without complications    Given by: Lajean Saver RN (Mar 28, 2010 3:09 PM)  Orders Added: 1)  Est. Patient Level III [99213] 2)  Ketorolac-Toradol 15mg  [J1885] 3)  Admin of Therapeutic Inj  intramuscular or subcutaneous [96372] 4)  Zofran 1mg . injection [J2405]

## 2010-12-19 NOTE — Progress Notes (Signed)
Summary: PA on dulera 200-92mcg - LMTCB x 1  Phone Note Outgoing Call   Call placed by: Boone Master CNA/MA,  June 18, 2010 4:03 PM Call placed to: [facility] Summary of Call: recieved PA for [facility] 200-40mcg.  called [facility], was told that advair diskus and hfa are tier II and also flovent diskus and hfa.  Dr. Maple Hudson, may pt be switched to something covered or would you like a PA done?    pt id # [id number] Initial call taken by: Boone Master CNA/MA,  June 18, 2010 4:06 PM  Follow-up for Phone Call        My intention was that she try the [facility] first- use up the sample and see how that type of med works before she immediately starts getting scripts filled. The [facility] sample has enough in it to last for a while. Follow-up by: Waymon Budge MD,  June 18, 2010 8:17 PM  Additional Follow-up for Phone Call Additional follow up Details #1::        ATC pt - [facility] Yetta Barre RN  June 19, 2010 11:59 AM     Additional Follow-up for Phone Call Additional follow up Details #2::    Pt states that [facility] seems to be working good.  PT has appt to f/u with Dr Maple Hudson on 07-18-10.  Pt given another sample of Dulera to last until ov and then she can discuss inusrance issues at that time. Abigail Miyamoto RN  June 20, 2010 10:29 AM

## 2010-12-19 NOTE — Assessment & Plan Note (Signed)
Summary: sob/former pt/apc   Primary Provider/Referring Provider:  Dalbert Mayotte, MD/ Kathryne Sharper  CC:  Re establish former pt last seen 04-07-07/SOB.Marland Kitchen  History of Present Illness:   04/07/07-PROBLEMS: 1. Asthmatic bronchitis. 2. Dyspnea. 3. Anxiety with bipolar disorder. 4. Esophageal reflux. 5. Allergic rhinitis.   HISTORY:  I had seen her almost a year ago. She was hospitalized on April 28-30 by Dr. Riley Kill for cardiology to rule out MI with chest pain. Workup was negative. She also had lower abdominal pain and discharge diagnoses were chest pain and hypokalemia with low abdominal and pelvic pain, history of major depressive disorder, history of opiate dependence, status post appendectomy, status post laparoscopic surgery for abdominal and pelvic adhesions with procedures including transvaginal pelvic ultrasound and 2D echocardiogram. She had presented describing a 2-3 month history of chest tightness and shortness of breath. An adenosine study had shown an EF of 73% without evidence of ischemia, although there were some ST abnormalities on EKG.   June 17, 2010- Asthmatic bronchitis, hx bipolar disorder, GERD, allergiuc rhinitis.....Marland Kitchenfiance  here Parents smoked heavily- passive exposure. Gradually worse DOE, maybe especialy in last 3 months. Cites DOE walking across rooms of home, liming ADLs. Dry cough, some wheeze, daily itch/ sneeze/ watery nose. Not helped by taking 4-5 benadryls/ day. Failed benefit Nasonex, Omnaris, Spiriva. Larey Seat in her home 2 days ago, striking across anterior chest. Painful repiration diffusely across anterior chest.  Positive allergy skin testing 2006. "Real bad" seasonal allergy, rhinitis, nasal congestion and drainage. For esophageal dilatation/ EGD in [place].    Preventive Screening-Counseling & Management  Alcohol-Tobacco     Smoking Status: never     Passive Smoke Exposure: yes     Passive Smoke Counseling: not indicated; no passive  smoke exposure  Comments: No longer exposed.  Current Medications (verified): 1)  Actos 30 Mg Tabs (Pioglitazone Hcl) .... Once Daily 2)  Xanax 2 Mg Tabs (Alprazolam) .... 4 Times Daily 3)  Lipitor 40 Mg Tabs (Atorvastatin Calcium) .... Qhs 4)  Vicodin Es 7.5-750 Mg Tabs (Hydrocodone-Acetaminophen) .Marland Kitchen.. 1 Q4h Prn 5)  Trazodone Hcl 100 Mg Tabs (Trazodone Hcl) .... Qhs 6)  Atrovent Hfa 17 Mcg/act Aers (Ipratropium Bromide Hfa) .... As Needed 7)  Ketorolac Tromethamine 10 Mg Tabs (Ketorolac Tromethamine) .... One By Mouth Q4 To 6hr As Needed (Max 4/day) 8)  Skelaxin 800 Mg Tabs (Metaxalone) .Marland Kitchen.. 1 By Mouth Hs 9)  Omnaris 50 Mcg/act Susp (Ciclesonide) .... 2 Sprays in Each Nostril Once Daily As Needed 10)  Lidoderm 5 % Ptch (Lidocaine) .... 2 Applied At Night 11)  Furosemide 40 Mg Tabs (Furosemide) .... Take 1 By Mouth As Needed Edema 12)  Levothroid 50 Mcg Tabs (Levothyroxine Sodium) .... Take 1 By Mouth Once Daily 13)  Prevacid 30 Mg Cpdr (Lansoprazole) .... Take 1 By Mouth Once Daily 14)  Promethazine Hcl 25 Mg Tabs (Promethazine Hcl) .... Take 1 By Mouth Every 6 Hours As Needed 15)  Metformin Hcl 500 Mg Tabs (Metformin Hcl) .... Take 1 By Mouth Fi Sugar Is Over 150  Allergies (verified): 1)  ! Morphine Sulfate (Morphine Sulfate) 2)  ! Sulfa 3)  ! Percocet (Oxycodone-Acetaminophen) 4)  ! Ultram (Tramadol Hcl) 5)  ! Darvocet 6)  ! Darvon 7)  ! * Pepto 8)  ! * Xray Dyes 9)  ! Cipro 10)  ! * Lorcet  Past History:  Family History: Last updated: 06/17/2010 Family Hx: asthma, heart disease,cancer Mother- died 12, COPD, DM Father- died colon cancer Siuster- died  age 50  Social History: Last updated: 06/17/2010 Single-engaged. Children grown Never Smoked Alcohol use-no Drug use-no Regular exercise-no Disability  Risk Factors: Exercise: no (03/11/2010)  Risk Factors: Smoking Status: never (06/17/2010) Passive Smoke Exposure: yes (06/17/2010)  Past Medical  History: Diabetes mellitus, type II Hyperlipidemia Bipolar Hx Major Depression Allergic Rhinitis Bronchitis G E R D Musculoskeletal pain Irregular heart beat  Past Surgical History: Appendectomy partial hysterectomy, adhesions - multiple surgeries Mouth  Family History: Family Hx: asthma, heart disease,cancer Mother- died 52, COPD, DM Father- died colon cancer Siuster- died age 28  Social History: Single-engaged. Children grown Never Smoked Alcohol use-no Drug use-no Regular exercise-no Disability Passive Smoke Exposure:  yes  Review of Systems      See HPI       The patient complains of shortness of breath with activity, shortness of breath at rest, productive cough, chest pain, irregular heartbeats, acid heartburn, loss of appetite, weight change, difficulty swallowing, headaches, nasal congestion/difficulty breathing through nose, sneezing, itching, anxiety, hand/feet swelling, joint stiffness or pain, and change in color of mucus.  The patient denies non-productive cough, coughing up blood, indigestion, abdominal pain, sore throat, tooth/dental problems, ear ache, depression, rash, and fever.    Vital Signs:  Patient profile:   50 year old female Height:      69 inches Weight:      186.50 pounds BMI:     27.64 O2 Sat:      98 % on Room air Pulse rate:   60 / minute BP sitting:   110 / 72  (left arm) Cuff size:   regular  Vitals Entered By: Reynaldo Minium CMA (June 17, 2010 3:18 PM)  O2 Flow:  Room air CC: Re establish former pt last seen 04-07-07/SOB.    Impression & Recommendations:  Problem # 1:  BRONCHITIS (ICD-490)  We will let her try changing Spiriva to [facility] sample. She has a hx of passive smoking and chroinc bronchits. We will get report of her CXR from [place] and schedule PFT. Her updated medication list for this problem includes:    Atrovent Hfa 17 Mcg/act Aers (Ipratropium bromide hfa) .Marland Kitchen... As needed    Dulera 200-5 Mcg/act Aero  (Mometasone furo-formoterol fum) .Marland Kitchen... 2 puffs and rinse mouth two times a day  Problem # 2:  MUSCULOSKELETAL PAIN (ICD-781.99)  She indicates she is very sore across the front of her chest from her fall and I agreed togive a single script for Tylenol # III to cover til her next primary visit. I wanted to have her able to blow hard for her PFT when scheduled. She was given vicodin 7/14 from her back doctor. Watch for recurrent pain med requests. I told her I would not give refils.  Medications Added to Medication List This Visit: 1)  Omnaris 50 Mcg/act Susp (Ciclesonide) .... 2 sprays in each nostril once daily as needed 2)  Lidoderm 5 % Ptch (Lidocaine) .... 2 applied at night 3)  Furosemide 40 Mg Tabs (Furosemide) .... Take 1 by mouth as needed edema 4)  Levothroid 50 Mcg Tabs (Levothyroxine sodium) .... Take 1 by mouth once daily 5)  Prevacid 30 Mg Cpdr (Lansoprazole) .... Take 1 by mouth once daily 6)  Promethazine Hcl 25 Mg Tabs (Promethazine hcl) .... Take 1 by mouth every 6 hours as needed 7)  Metformin Hcl 500 Mg Tabs (Metformin hcl) .... Take 1 by mouth fi sugar is over 150 8)  Acetaminophen-codeine #3 300-30 Mg Tabs (Acetaminophen-codeine) .Marland KitchenMarland KitchenMarland Kitchen 1  three times a day as needed 9)  Dulera 200-5 Mcg/act Aero (Mometasone furo-formoterol fum) .... 2 puffs and rinse mouth two times a day  Other Orders: Pneumococcal Vaccine (04540) Admin 1st Vaccine (98119) New Patient Level IV (14782)  Patient Instructions: 1)  Please schedule a follow-up appointment in 1 month. 2)  Schedule PFT 3)  Script for tylenol # 3 - use sparingly for pain. I won't be able to refill it. Try a heating pad. 4)  Set the Spiriva aside, since it doesn't seem to help now. Try sample Dulera 200-5, 2 puffs and rinse mouth, twice daily. 5)  We will contact the imaging center in [place] for a copy of your recent xray report. 6)  Pneumovax Prescriptions: DULERA 200-5 MCG/ACT AERO (MOMETASONE FURO-FORMOTEROL FUM) 2  puffs and rinse mouth two times a day  #1 x prn   Entered and Authorized by:   Waymon Budge MD   Signed by:   Waymon Budge MD on 06/17/2010   Method used:   Print then Give to Patient   RxID:   [id number] ACETAMINOPHEN-CODEINE #3 300-30 MG TABS (ACETAMINOPHEN-CODEINE) 1 three times a day as needed  #25 x 0   Entered and Authorized by:   Waymon Budge MD   Signed by:   Waymon Budge MD on 06/17/2010   Method used:   Print then Give to Patient   RxID:   [id number]   Prevention & Chronic Care Immunizations   Influenza vaccine: Not documented    Tetanus booster: Not documented    Pneumococcal vaccine: Pneumovax  (06/17/2010)  Other Screening   Pap smear: Not documented    Mammogram: Not documented   Smoking status: never  (06/17/2010)  Diabetes Mellitus   HgbA1C: Not documented    Eye exam: Not documented    Foot exam: Not documented   High risk foot: Not documented   Foot care education: Not documented    Urine microalbumin/creatinine ratio: Not documented  Lipids   Total Cholesterol: Not documented   LDL: Not documented   LDL Direct: Not documented   HDL: Not documented   Triglycerides: Not documented    SGOT (AST): Not documented   SGPT (ALT): Not documented   Alkaline phosphatase: Not documented   Total bilirubin: Not documented  Self-Management Support :    Diabetes self-management support: Not documented    Lipid self-management support: Not documented     Immunizations Administered:  Pneumonia Vaccine:    Vaccine Type: Pneumovax    Site: right deltoid    Mfr: Merck    Dose: 0.5 ml    Route: IM    Given by: Carver Fila    Exp. Date: 12/04/2011    Lot #: [id number]

## 2010-12-19 NOTE — Letter (Signed)
Summary: AUTH FOR MEDS  AUTH FOR MEDS   Imported By: Dannette Barbara 08/27/2010 09:09:01  _____________________________________________________________________  External Attachment:    Type:   Image     Comment:   External Document

## 2010-12-19 NOTE — Assessment & Plan Note (Signed)
Summary: BACK AND TAILBONE PAIN   Vital Signs:  Patient Profile:   50 Years Old Female CC:      Fell last night in bathroom, vomiting x 2 days Height:     69 inches Weight:      191 pounds O2 Sat:      97 % O2 treatment:    Room Air Temp:     97.5 degrees F oral Pulse rate:   75 / minute Pulse rhythm:   regular Resp:     18 per minute BP sitting:   124 / 76  (left arm) Cuff size:   regular  Pt. in pain?   yes    Location:   lower back    Intensity:   10    Type:       sharp  Vitals Entered By: Emilio Math (August 26, 2010 10:23 AM)                   Current Allergies (reviewed today): ! MORPHINE SULFATE (MORPHINE SULFATE) ! SULFA ! PERCOCET (OXYCODONE-ACETAMINOPHEN) ! ULTRAM (TRAMADOL HCL) ! DARVOCET ! DARVON ! * PEPTO ! * XRAY DYES ! CIPRO ! * LORCETHistory of Present Illness Chief Complaint: Larey Seat last night in bathroom, vomiting x 2 days History of Present Illness: Has vomiting for the past few days with nausea.  Thinks she has a URI starting and since she is coughing, she is now throwing up.  Emesis x1-2 times per day.  +runny nose.  She saw GI 2 months ago for a esophageal dilation procedure and felt fine afterwards.  She states that nausea isn't chronic. Then she fell in bathroom last night. Doesn't know how but maybe tripped over a rug.  Has a lot of back and hip and tailbone pain that radiates everywhere.  No wekness, numbness, saddle anesthesia, or problems goin to the batroom.  She is asking for Demerol or Oxycontin and a muscle relaxant.  She has a lot of pain in the past.  Previously she states that she is allergic to a lot pain medicines including Percocet, Lorcet, and Vicodin, but is taking narcotics as Rx by her PCP.  Current Meds ACTOS 30 MG TABS (PIOGLITAZONE HCL) once daily XANAX 2 MG TABS (ALPRAZOLAM) 4 times daily LIPITOR 40 MG TABS (ATORVASTATIN CALCIUM) qhs VICODIN ES 7.5-750 MG TABS (HYDROCODONE-ACETAMINOPHEN) 1 q4h prn TRAZODONE HCL 100 MG  TABS (TRAZODONE HCL) qhs LIDODERM 5 % PTCH (LIDOCAINE) 2 applied at night FUROSEMIDE 40 MG TABS (FUROSEMIDE) take 1 by mouth as needed edema METFORMIN HCL 500 MG TABS (METFORMIN HCL) take 1 by mouth fi sugar is over 150 DULERA 200-5 MCG/ACT AERO (MOMETASONE FURO-FORMOTEROL FUM) 2 puffs and rinse mouth two times a day NUCYNTA 50 MG TABS (TAPENTADOL HCL) 1 tab by mouth at bedtime as needed for pain PROMETHAZINE HCL 25 MG TABS (PROMETHAZINE HCL) 1 tab by mouth Q6 hours as needed for nausea SKELAXIN 800 MG TABS (METAXALONE) 1 by mouth three times a day as needed for muscle spasms  REVIEW OF SYSTEMS Constitutional Symptoms      Denies fever, chills, night sweats, weight loss, weight gain, and fatigue.  Eyes       Denies change in vision, eye pain, eye discharge, glasses, contact lenses, and eye surgery. Ear/Nose/Throat/Mouth       Denies hearing loss/aids, change in hearing, ear pain, ear discharge, dizziness, frequent runny nose, frequent nose bleeds, sinus problems, sore throat, hoarseness, and tooth pain or bleeding.  Respiratory  Denies dry cough, productive cough, wheezing, shortness of breath, asthma, bronchitis, and emphysema/COPD.  Cardiovascular       Denies murmurs, chest pain, and tires easily with exhertion.    Gastrointestinal       Complains of nausea/vomiting.      Denies stomach pain, diarrhea, constipation, blood in bowel movements, and indigestion. Genitourniary       Denies painful urination, kidney stones, and loss of urinary control. Neurological       Denies paralysis, seizures, and fainting/blackouts. Musculoskeletal       Complains of muscle pain, joint pain, joint stiffness, and decreased range of motion.      Denies redness, swelling, muscle weakness, and gout.  Skin       Denies bruising, unusual mles/lumps or sores, and hair/skin or nail changes.  Psych       Denies mood changes, temper/anger issues, anxiety/stress, speech problems, depression, and sleep  problems.  Past History:  Past Medical History: Reviewed history from 06/17/2010 and no changes required. Diabetes mellitus, type II Hyperlipidemia Bipolar Hx Major Depression Allergic Rhinitis Bronchitis G E R D Musculoskeletal pain Irregular heart beat  Past Surgical History: Reviewed history from 06/27/2010 and no changes required. Appendectomy partial hysterectomy, adhesions - multiple surgeries Mouth  Family History: Reviewed history from 06/17/2010 and no changes required. Family Hx: asthma, heart disease,cancer Mother- died 15, COPD, DM Father- died colon cancer [place]- died age 5  Social History: Reviewed history from 06/17/2010 and no changes required. Single-engaged. Children grown Never Smoked Alcohol use-no Drug use-no Regular exercise-no Disability Physical Exam General appearance: well developed, well nourished, mild distress Head: normocephalic, atraumatic Neurological: grossly intact and non-focal Back: TTP everywhere to light touch, midline doesn't appear to be more painful.  Cannot assess much because she is resistant to examination Skin: bruise on L forearm (3cm) and R leg (4cm) MSE: oriented to time, place, and person.   Assessment I believe she may have fell, but is also seeking pain meds.    Plan New Medications/Changes: SKELAXIN 800 MG TABS (METAXALONE) 1 by mouth three times a day as needed for muscle spasms  #15 x 0, 08/26/2010, Hoyt Koch MD PROMETHAZINE HCL 25 MG TABS (PROMETHAZINE HCL) 1 tab by mouth Q6 hours as needed for nausea  #15 x 0, 08/26/2010, Hoyt Koch MD NUCYNTA 50 MG TABS (TAPENTADOL HCL) 1 tab by mouth at bedtime as needed for pain  #10 x 0, 08/26/2010, Hoyt Koch MD  New Orders: Est. Patient Level III [99213] Ketorolac-Toradol 15mg  [Z6109] Admin of Therapeutic Inj  intramuscular or subcutaneous [96372] Planning Comments:   Patient refuses Xray Looking at the [facility] stateRx list, she is getting  monthly Vicodin 7.5 from her PCP.  Her next appt is next week.  I have told her I will treat the pain with Nucynta 50mg  (if insurance won't pay, then may do something like Ultracet) but she cannot get more pain meds here, especially with all of her medication allergies.  We are not her PCP and she will need to call them to make an appt for further pain meds or for any chronic treatment of medical problems. I have also noticed that she is getting nausea medicines frequently from [place], but she denies ever having nausea prior to this week.  I would advise her to f/u with her GI doctor for further treatment to see if she needs any additional workup.   The patient and/or caregiver has been counseled thoroughly with regard to medications prescribed including dosage,  schedule, interactions, rationale for use, and possible side effects and they verbalize understanding.  Diagnoses and expected course of recovery discussed and will return if not improved as expected or if the condition worsens. Patient and/or caregiver verbalized understanding.  Prescriptions: SKELAXIN 800 MG TABS (METAXALONE) 1 by mouth three times a day as needed for muscle spasms  #15 x 0   Entered and Authorized by:   Hoyt Koch MD   Signed by:   Hoyt Koch MD on 08/26/2010   Method used:   Print then Give to Patient   RxID:   [id number] PROMETHAZINE HCL 25 MG TABS (PROMETHAZINE HCL) 1 tab by mouth Q6 hours as needed for nausea  #15 x 0   Entered and Authorized by:   Hoyt Koch MD   Signed by:   Hoyt Koch MD on 08/26/2010   Method used:   Print then Give to Patient   RxID:   [id number] NUCYNTA 50 MG TABS (TAPENTADOL HCL) 1 tab by mouth at bedtime as needed for pain  #10 x 0   Entered and Authorized by:   Hoyt Koch MD   Signed by:   Hoyt Koch MD on 08/26/2010   Method used:   Print then Give to Patient   RxID:   [id number]   Medication Administration  Injection # 1:     Medication: Ketorolac-Toradol 15mg     Diagnosis: LUMBAR STRAIN, ACUTE (ICD-847.2)    Route: IM    Site: LUOQ gluteus    Exp Date: 09/18/2011    Lot #: [id number]    Mfr: Hospira    Comments: 30 mgs given    Given by: Emilio Math (August 26, 2010 11:02 AM)  Orders Added: 1)  Est. Patient Level III [99213] 2)  Ketorolac-Toradol 15mg  [J1885] 3)  Admin of Therapeutic Inj  intramuscular or subcutaneous [96372]   Medication Administration  Injection # 1:    Medication: Ketorolac-Toradol 15mg     Diagnosis: LUMBAR STRAIN, ACUTE (ICD-847.2)    Route: IM    Site: LUOQ gluteus    Exp Date: 09/18/2011    Lot #: [id number]    Mfr: Hospira    Comments: 30 mgs given    Given by: Emilio Math (August 26, 2010 11:02 AM)  Orders Added: 1)  Est. Patient Level III [01027] 2)  Ketorolac-Toradol 15mg  [J1885] 3)  Admin of Therapeutic Inj  intramuscular or subcutaneous [25366]

## 2010-12-19 NOTE — Letter (Signed)
Summary: CONTROLLED MED RX POLICY  CONTROLLED MED RX POLICY   Imported By: Dannette Barbara 09/23/2010 17:34:51  _____________________________________________________________________  External Attachment:    Type:   Image     Comment:   External Document

## 2010-12-19 NOTE — Assessment & Plan Note (Signed)
Summary: Larey Seat -Knees/legs - B, R hand/shoulder, back x today rm 3   Vital Signs:  Patient Profile:   50 Years Old Female CC:      Fell Knee/leg pain-B, R arm/hand, back/shoulder x today  Height:     69 inches Weight:      178 pounds O2 Sat:      100 % O2 treatment:    Room Air Temp:     97.7 degrees F oral Pulse rate:   77 / minute Pulse rhythm:   regular Resp:     15 per minute BP sitting:   116 / 74  (left arm) Cuff size:   regular  Vitals Entered By: Areta Haber CMA (September 23, 2010 2:03 PM)                  Current Allergies: ! MORPHINE SULFATE (MORPHINE SULFATE) ! SULFA ! PERCOCET (OXYCODONE-ACETAMINOPHEN) ! ULTRAM (TRAMADOL HCL) ! DARVOCET ! DARVON ! * PEPTO ! * XRAY DYES ! CIPRO ! * LORCETHistory of Present Illness Chief Complaint: Fell Knee/leg pain-B, R arm/hand, back/shoulder x today  History of Present Illness:  Subjective:  Patient tripped and fell forward today, breaking her fall with her right hand, but landing on both knees.  She complains of pain in her right shoulder, elbow, and wrist.  Also pain in right anterior knee and her right ankle.  Current Problems: WRIST SPRAIN, RIGHT (ICD-842.00) ANKLE SPRAIN, RIGHT (ICD-845.00) KNEE PAIN, RIGHT, ACUTE (ICD-719.46) ELBOW PAIN, RIGHT (ICD-719.42) WRIST PAIN, RIGHT (ICD-719.43) SHOULDER PAIN, RIGHT (ICD-719.41) COUGH (ICD-786.2) G E R D (ICD-530.81) ALLERGIC RHINITIS (ICD-477.9) BRONCHITIS (ICD-490) LUMBAR STRAIN, ACUTE (ICD-847.2) MYALGIA (ICD-729.1) MUSCULOSKELETAL PAIN (ICD-781.99) HYPERLIPIDEMIA (ICD-272.4) DIABETES MELLITUS, TYPE II (ICD-250.00)   Current Meds ACTOS 30 MG TABS (PIOGLITAZONE HCL) once daily XANAX 2 MG TABS (ALPRAZOLAM) 4 times daily LIPITOR 40 MG TABS (ATORVASTATIN CALCIUM) qhs VICODIN ES 7.5-750 MG TABS (HYDROCODONE-ACETAMINOPHEN) 1 q4h prn TRAZODONE HCL 100 MG TABS (TRAZODONE HCL) qhs LIDODERM 5 % PTCH (LIDOCAINE) 2 applied at night FUROSEMIDE 40 MG TABS  (FUROSEMIDE) take 1 by mouth as needed edema METFORMIN HCL 500 MG TABS (METFORMIN HCL) take 1 by mouth fi sugar is over 150 DULERA 200-5 MCG/ACT AERO (MOMETASONE FURO-FORMOTEROL FUM) 2 puffs and rinse mouth two times a day PROMETHAZINE HCL 25 MG TABS (PROMETHAZINE HCL) 1 tab by mouth Q6 hours as needed for nausea KETOROLAC TROMETHAMINE 10 MG TABS (KETOROLAC TROMETHAMINE) 1 by mouth q4 to 6hr pc (max of 4/day) METHOCARBAMOL 500 MG TABS (METHOCARBAMOL) 1 by mouth qid as needed for muscle spasm  REVIEW OF SYSTEMS Constitutional Symptoms      Denies fever, chills, night sweats, weight loss, weight gain, and fatigue.  Eyes       Denies change in vision, eye pain, eye discharge, glasses, contact lenses, and eye surgery. Ear/Nose/Throat/Mouth       Denies hearing loss/aids, change in hearing, ear pain, ear discharge, dizziness, frequent runny nose, frequent nose bleeds, sinus problems, sore throat, hoarseness, and tooth pain or bleeding.  Respiratory       Denies dry cough, productive cough, wheezing, shortness of breath, asthma, bronchitis, and emphysema/COPD.  Cardiovascular       Denies murmurs, chest pain, and tires easily with exhertion.    Gastrointestinal       Denies stomach pain, nausea/vomiting, diarrhea, constipation, blood in bowel movements, and indigestion. Genitourniary       Denies painful urination, kidney stones, and loss of urinary control. Neurological  Denies paralysis, seizures, and fainting/blackouts. Musculoskeletal       Complains of muscle pain, joint pain, joint stiffness, decreased range of motion, redness, and swelling.      Denies muscle weakness and gout.      Comments: B knees/legs, R arm/hand, back, shoulder x today Skin       Denies bruising, unusual mles/lumps or sores, and hair/skin or nail changes.  Psych       Denies mood changes, temper/anger issues, anxiety/stress, speech problems, depression, and sleep problems. Other Comments: Pt states she fell   in [facility] today.    Past History:  Past Medical History: Last updated: 06/17/2010 Diabetes mellitus, type II Hyperlipidemia Bipolar Hx Major Depression Allergic Rhinitis Bronchitis G E R D Musculoskeletal pain Irregular heart beat  Past Surgical History: Last updated: 06/27/2010 Appendectomy partial hysterectomy, adhesions - multiple surgeries Mouth  Family History: Last updated: 06/17/2010 Family Hx: asthma, heart disease,cancer Mother- died 33, COPD, DM Father- died colon cancer [place]- died age 79  Social History: Last updated: 06/17/2010 Single-engaged. Children grown Never Smoked Alcohol use-no Drug use-no Regular exercise-no Disability  Risk Factors: Exercise: no (03/11/2010)  Risk Factors: Smoking Status: never (06/17/2010) Passive Smoke Exposure: yes (06/17/2010)   Objective:  No acute distress.  Patient moves slowly and deliberately Neck:  Good range of motion  Lungs:  Clear to auscultation.  Breath sounds are equal.  Heart:  Regular rate and rhythm without murmurs, rubs, or gallops.  Right shoulder:  Has difficulty abducting above horizontal.  Vague diffuse tenderness but no deformity.  No tenderness over [facility] joint Right elbow:  Decreased range of motion.  No deformity.  No obvious swelling.  Diffuse mild tenderness. Right wrist:  Mild swelling dorsally over distal radius/ulna.  No deformity.  Diffuse mild tenderness.  Decreased range of motion.  Tenderness over snuffbox.  Distal neurovascular intact  Right knee:  No effusion,  erythema, or warmth.  Knee stable, negative drawer test.  McMurray test negative.  Decreased range of motion.  Tenderness and mild swelling over anterior patella. Objective Right ankle:  Decreased range of motion.  Mild tenderness but minimal swelling over the lateral malleolus.  Joint stable.  No tenderness over the base of the fifth  metatarsal.  Distal neurovascular intact.   RIGHT SHOULDER - 2+ VIEW     Comparison: None.   Findings: The right humeral head is in normal position within the glenohumeral joint space.  The right [facility] joint is normally aligned. No acute bony abnormality is seen.   IMPRESSION: Negative right shoulder.  RIGHT ELBOW - COMPLETE 3+ VIEW   Comparison: None.   Findings: No acute fracture is seen.  Alignment is normal.  No joint space effusion is noted.   IMPRESSION: No acute abnormality.  RIGHT WRIST - COMPLETE 3+ VIEW   Comparison: None.   Findings: The radiocarpal joint space appears normal.  The carpal bones are in normal position.  Alignment is normal.  No acute fracture is seen.   IMPRESSION: Negative right wrist.    RIGHT KNEE - COMPLETE 4+ VIEW   Comparison: None.   Findings: The right knee joint spaces appear normal.  No fracture is seen.  No effusion is noted.   IMPRESSION: Negative right knee. Assessment New Problems: WRIST SPRAIN, RIGHT (ICD-842.00) ANKLE SPRAIN, RIGHT (ICD-845.00) KNEE PAIN, RIGHT, ACUTE (ICD-719.46) ELBOW PAIN, RIGHT (ICD-719.42) WRIST PAIN, RIGHT (ICD-719.43) SHOULDER PAIN, RIGHT (ICD-719.41)  MULTIPLE MINOR MUSCULOSKELETAL INJURIES, INCLUDING RIGHT WRIST SPRAIN AND RIGHT ANKLE  SPRAIN.  Plan New Medications/Changes: METHOCARBAMOL 500 MG TABS (METHOCARBAMOL) 1 by mouth qid as needed for muscle spasm  #28 x 0, 09/23/2010, Donna Christen MD KETOROLAC TROMETHAMINE 10 MG TABS (KETOROLAC TROMETHAMINE) 1 by mouth q4 to 6hr pc (max of 4/day)  #24 x 0, 09/23/2010, Donna Christen MD  New Orders: T-DG Shoulder*R* [73030] T-DG Elbow Complete*R* [73080] T-DG Wrist Complete*R* [73110] T-DG Knee 2 Views*R* [73560] Ketorolac-Toradol 15mg  [J1885] Admin of Therapeutic Inj  intramuscular or subcutaneous [96372] Ace Wraps 3-5 in/yard  [A6449] Slings- All Types [A4565] Est. Patient Level IV [16109] Planning Comments:   Applied ace wrap to right wrist, right knee, and right ankle and wear for several days until swelling  resolves.  Dispensed right arm sling: wear for 3 to 5 days. Toradol 60mg  IM.  Begin Torodol 10mg  by mouth for 3 to 6 days.  Rx for muscle relaxant.  Contact PCP if needs additional analgesic medication.  Use crutches or walker for about 5 days.  Begin applying ice packs on injured areas several times daily until swelling decreases. Follow-up with PCP   The patient and/or caregiver has been counseled thoroughly with regard to medications prescribed including dosage, schedule, interactions, rationale for use, and possible side effects and they verbalize understanding.  Diagnoses and expected course of recovery discussed and will return if not improved as expected or if the condition worsens. Patient and/or caregiver verbalized understanding.  Prescriptions: METHOCARBAMOL 500 MG TABS (METHOCARBAMOL) 1 by mouth qid as needed for muscle spasm  #28 x 0   Entered and Authorized by:   Donna Christen MD   Signed by:   Donna Christen MD on 09/23/2010   Method used:   Print then Give to Patient   RxID:   [id number] KETOROLAC TROMETHAMINE 10 MG TABS (KETOROLAC TROMETHAMINE) 1 by mouth q4 to 6hr pc (max of 4/day)  #24 x 0   Entered and Authorized by:   Donna Christen MD   Signed by:   Donna Christen MD on 09/23/2010   Method used:   Print then Give to Patient   RxID:   [id number]   Medication Administration  Injection # 1:    Medication: Ketorolac-Toradol 15mg     Diagnosis: ANKLE SPRAIN, RIGHT (ICD-845.00)    Route: IM    Site: RUOQ gluteus    Exp Date: 02/16/2012    Lot #: [id number]    Mfr: Hospira    Comments: Administered 60mg     Patient tolerated injection without complications    Given by: Areta Haber CMA (September 23, 2010 2:43 PM)  Orders Added: 1)  T-DG Shoulder*R* [73030] 2)  T-DG Elbow Complete*R* [73080] 3)  T-DG Wrist Complete*R* [73110] 4)  T-DG Knee 2 Views*R* [73560] 5)  Ketorolac-Toradol 15mg  [J1885] 6)  Admin of Therapeutic Inj  intramuscular or subcutaneous  [96372] 7)  Ace Wraps 3-5 in/yard  [A6449] 8)  Slings- All Types [A4565] 9)  Est. Patient Level IV [32440]     Medication Administration  Injection # 1:    Medication: Ketorolac-Toradol 15mg     Diagnosis: ANKLE SPRAIN, RIGHT (ICD-845.00)    Route: IM    Site: RUOQ gluteus    Exp Date: 02/16/2012    Lot #: [id number]    Mfr: Hospira    Comments: Administered 60mg     Patient tolerated injection without complications    Given by: Areta Haber CMA (September 23, 2010 2:43 PM)  Orders Added: 1)  T-DG Shoulder*R* [73030] 2)  T-DG Elbow Complete*R* [73080] 3)  T-DG Wrist Complete*R* [73110] 4)  T-DG Knee 2 Views*R* [73560] 5)  Ketorolac-Toradol 15mg  [J1885] 6)  Admin of Therapeutic Inj  intramuscular or subcutaneous [96372] 7)  Ace Wraps 3-5 in/yard  [A6449] 8)  Slings- All Types [A4565] 9)  Est. Patient Level IV [98119]

## 2010-12-27 ENCOUNTER — Encounter (INDEPENDENT_AMBULATORY_CARE_PROVIDER_SITE_OTHER): Payer: Medicare Other | Admitting: Licensed Clinical Social Worker

## 2010-12-27 DIAGNOSIS — F3162 Bipolar disorder, current episode mixed, moderate: Secondary | ICD-10-CM

## 2011-01-21 ENCOUNTER — Encounter (INDEPENDENT_AMBULATORY_CARE_PROVIDER_SITE_OTHER): Payer: Medicare Other | Admitting: Licensed Clinical Social Worker

## 2011-01-21 DIAGNOSIS — F3162 Bipolar disorder, current episode mixed, moderate: Secondary | ICD-10-CM

## 2011-02-20 ENCOUNTER — Encounter (INDEPENDENT_AMBULATORY_CARE_PROVIDER_SITE_OTHER): Payer: Medicare Other | Admitting: Licensed Clinical Social Worker

## 2011-02-20 DIAGNOSIS — F3162 Bipolar disorder, current episode mixed, moderate: Secondary | ICD-10-CM

## 2011-03-25 ENCOUNTER — Encounter (INDEPENDENT_AMBULATORY_CARE_PROVIDER_SITE_OTHER): Payer: Medicare Other | Admitting: Licensed Clinical Social Worker

## 2011-03-25 DIAGNOSIS — F3162 Bipolar disorder, current episode mixed, moderate: Secondary | ICD-10-CM

## 2011-04-01 NOTE — Assessment & Plan Note (Signed)
[facility]                             PULMONARY OFFICE NOTE   NAME:Mottley, ANITA LAGUNA                   MRN:          [id number]  DATE:04/06/2007                            DOB:          1961-06-25    PROBLEMS:  1. Asthmatic bronchitis.  2. Dyspnea.  3. Anxiety with bipolar disorder.  4. Esophageal reflux.  5. Allergic rhinitis.   HISTORY:  I had seen her almost a year ago. She was hospitalized on  April 28-30 by Dr. Riley Kill for cardiology to rule out MI with chest  pain. Workup was negative. She also had lower abdominal pain and  discharge diagnoses were chest pain and hypokalemia with low abdominal  and pelvic pain, history of major depressive disorder, history of opiate  dependence, status post appendectomy, status post laparoscopic surgery  for abdominal and pelvic adhesions with procedures including  transvaginal pelvic ultrasound and 2D echocardiogram. She had presented  describing a 2-3 month history of chest tightness and shortness of  breath. An adenosine study had shown an EF of 73% without evidence of  ischemia, although there were some ST abnormalities on EKG.   Discharge medications included:  1. Lipitor 40 mg.  2. Xanax 1 mg.  3. Prevacid 20 mg.  4. Spiriva once daily.  5. Atrovent inhaler 2 puffs daily p.r.n.  6. Trazodone 200 mg nightly.   She has a history of drug intolerance to MORPHINE, PERCOCET, CORTISONE  INJECTIONS, SULFA, CIPRO, ULTRAM, DARVOCET, DOXYCYCLINE, and FLAGYL.   She shows me a bottle of generic Stadol nasal spray 10 mg per ml asking  that I fill it for pain management. She has been put on birth control  pills to help manage her abdominal pain. She says that she is still  hurting especially in the bilateral lower rib areas and left parasternal  area now and that pain hurts more if she walks far. She has had some  mild nausea. She denies heartburn on Prevacid. Forcing a deep breath  increases the pains in  her chest which she interprets as bronchitis,  although she is not coughing or coughing up anything. She asks that I  refill Atrovent, Zithromax, Magic Mouthwash, and the Stadol nasal spray.  Chest x-ray on 04/05/2007 here showed stable exam with cardiomegaly,  chronic interstitial disease, and bibasilar scarring, stable right hilar  prominence, all compared with April 2007.   OBJECTIVE:  Weight 165 pounds, blood pressure 138/84, pulse 76, room air  saturation 98%. No distress. Affect is a little vague in a non-  specific way. There is no tremor or sweating. No adenopathy or edema.  Lung fields are quite clear. Heart sounds regular.   IMPRESSION:  Bronchitis, musculoskeletal pain. I would be concerned  about her request for narcotic level pain medication and I refused to  fill the prescription for her Stadol nasal spray today.   PLAN:  I did give a standby prescription for Z-pack and [facility] Magic  Mouthwash 5 ml swish and spit daily p.r.n. I offer consideration of  whether a bone scan or CT scan of her spine would be useful. She  is  directed back to her primary physician for routine management. I will be  happy to see her again p.r.n.     Clinton D. Maple Hudson, MD, Tonny Bollman, FACP  Electronically Signed    CDY/MedQ  DD: 04/07/2007  DT: 04/08/2007  Job #: [id number]   cc:   Dalbert Mayotte, M.D.  Zenaida Niece, M.D.

## 2011-04-04 NOTE — Assessment & Plan Note (Signed)
[facility]                               PULMONARY OFFICE NOTE   NAME:Schlechter, NORIAH OSGOOD                   MRN:          [id number]  DATE:06/25/2006                            DOB:          1961-10-08    PROBLEM LIST:  1. Asthmatic bronchitis.  2. Dyspnea.  3. Anxiety with bipolar disorder.  4. Esophageal reflux.  5. Allergic rhinitis.   HISTORY:  The patient was skin test positive for common environmental  allergens in April.  Since I last saw her she had called in early July  requesting a Z-Pak and Magic Mouthwash,  which was declined by the after  hours call person.  She complains of recurrent thrush with antibiotics.  She  asked advise about starting allergy vaccine and I reviewed more conservative  measures for her first.  She stated that sometimes she needs Benadryl for  itching on her arms.  There is some mild-to-moderate daily nasal congestion  and a little cough in this nonsmoker.  We discussed current environmental  issues, particularly including air quality for this time of year.  She is  here today with her husband/significant other.   MEDICATIONS:  1. Xanax 1 mg q.i.d.  2. Trazodone 100 mg ties 1-2 at bedtime.  3. Augmentin has been used as a p.r.n. occasionally for bronchitic      episodes.  4. Dukes Magic Mouthwash.  5. The patient has had Ambien, Phenergan, Vicodin, Lomotil, and Tussionex.   MEDICATION INTOLERANCES:  The patient's medication intolerances include  MORPHINE, PERCOCET, CORTISONE SHOTS, SULFA, CIPRO, ULTRAM, DARVOCET,  DOXYCYCLINE, AND FLAGYL.   PRIMARY CARE PHYSICIAN:  The patient is followed in [facility] for her primary  care.  I do not have the name of her current psychiatrist.   OBJECTIVE:  VITAL SIGNS:  Weight 159 pounds, BP 128/64, pulse regular at 82,  and room air saturation 97%.  GENERAL APPEARANCE:  Dull or flattened affect.  She is fully oriented.  HEENT:  Pupils react.  The tongue is coated.  The  pharynx is not inflamed.  NECK:  I cannot find any adenopathy.  LUNGS:  There is minor congestion in the posterior lung base.  Breathing is  regular.  HEART:  Heart sounds are regular without murmur or gallop.  ABDOMEN:  I cannot feel enlargement of the liver or spleen.  EXTREMITIES:  I do not see needle marks.   IMPRESSION:  1. Thrush.  2. Recurrent bronchitis.  3. The patient tells me she has not been at acquired immunodeficiency      syndrome risk, but I think that should be watched by her primary care      physician and gynecologist.  4. I think she does have a bronchitis syndrome now and I decided to go      ahead and give her one more cycle of Augmentin on her request; but, if      there are more such episodes I think we need objective documentation      with a chest x-ray and blood work.   PLAN:  1. Refill Dukes Magic Mouthwash.  2. Refill Augmentin 875 mg twice a day times seven days to hold.  3. Tussionex, refill, 120 mL 5 mL q.12 hours p.r.n. for occasional use      only.  4. Schedule return in six weeks or earlier p.r.n.  5. Of note, she is asking to start allergy vaccine; I am not sure that is      necessary or appropriate at this point.  6. Refill Atrovent inhaler.                                   Clinton D. Maple Hudson, MD, [facility], FACP   CDY/MedQ  DD:  06/25/2006  DT:  06/26/2006  Job #:  [id number]   cc:   Dalbert Mayotte, MD

## 2011-04-04 NOTE — Discharge Summary (Signed)
Brittney Gonzalez, [name] NO.:  [id number]   MEDICAL RECORD NO.:  [id number]          PATIENT TYPE:  IPS   LOCATION:  0504                          [facility]ILITY[facility]  [facility]   PHYSICIAN:  Brittney Gonzalez, M.D. DATE OF BIRTH:  1961/08/30   DATE OF ADMISSION:  04/09/2005  DATE OF DISCHARGE:  04/13/2005                                 DISCHARGE SUMMARY   IDENTIFYING DATA:  This is a 50 year old Caucasian female, single,  voluntarily admitted, with increased agitation.  Confronted with brother  with cleaning up room.  Endorsed gradually increasing depression for 6  months, reclusive, anxiety, tearfulness.  Thought she heard someone calling  her name.  Seen Brittney Gonzalez in the past and being inpatient at [facility] in November of 2004.   ADMISSION MEDICATIONS:  Vicodin, albuterol, Lamictal, Xanax, Ambien,  Phenergan, Spiriva.   ALLERGIES:  LORCET, LORCET PLUS, ULTRAM, DARVOCET, PERCOCET, MORPHINE.   PHYSICAL AND NEUROLOGICAL EXAMINATION:  Within normal limits.   MENTAL STATUS EXAM:  Alert, anxious, hyper verbal, tearful, anxious, mild  psychomotor agitation, depressed, possible somatic delusions, preoccupation  and mood lability, with some thought agitation.  Cognitively grossly intact.  Judgment and insight impaired.   ADMISSION DIAGNOSES:   AXIS I:  1.  Major depressive disorder, severe with psychotic features, versus      bipolar disorder, type 2.  2.  Rule out benzodiazepine dependence and possible withdrawal syndrome.  3.  Opiate dependence, possible withdrawal syndrome.  4.  An anxiety disorder not otherwise specified.  5.  Substance-induced mood disorder superimposed on a depressive or bipolar      disorder.   AXIS II:  Deferred.   AXIS III:  Asthma, chronic back pain, and status post contusion of the  sacrum, and irritable bowel syndrome.   AXIS IV:  Moderate, conflict with family.   AXIS V:  30/56.   HOSPITAL COURSE:  The patient was admitted  and ordered routine p.r.n.  medications, underwent further monitoring, was placed on safety checks.  Reported positive response with crisis intervention, responded to medication  changes and participated in aftercare planning.  Risk/benefit ratio and  alternative treatments were reviewed.  Medication education was given and  the patient was discharged on:  1.  Spiriva 18 mcg 1 inhalation daily.  2.  Neurontin 100 mg 2 three times a day.  3.  Depakote 250 mg 1 in the morning and 2 at night.  4.  Lidocaine patch applied at 9 a.m. and 7 p.m. as directed, for a 12 hour      period as directed.  5  Risperdal 0.5 mg q.h.s.  1.  Albuterol inhaler 2 puffs q.6h p.r.n.  2.  Vicodin 5/500 1.5 tablets up to 3 times a day.  3.  Ambien 10 mg 1 q.h.s. p.r.n. insomnia.   DISPOSITION:  The patient was to follow up with Dr. Roselind Gonzalez and was to call  for an appointment with Dr. Roselind Gonzalez.  Office was closed on Friday.   DISCHARGE DIAGNOSES:   AXIS I:  1.  Major depressive disorder, severe with psychotic features, versus  bipolar disorder type 2.  2.  Rule out benzodiazepine dependence and possible withdrawal syndrome.  3.  Opiate dependence, possible withdrawal syndrome.  4.  An anxiety disorder not otherwise specified.  5.  Substance-induced mood disorder superimposed on a depressive or bipolar      disorder.   AXIS II:  Deferred.   AXIS III:  Asthma, chronic back pain, and status post contusion of the  sacrum, and irritable bowel syndrome.   AXIS IV:  Moderate, conflict with family.   AXIS V:  Global assessment of function on discharge was 55.       JEM/MEDQ  D:  05/18/2005  T:  05/18/2005  Job:  [id number]
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
## 2011-04-04 NOTE — Discharge Summary (Signed)
Brittney Gonzalez, Brittney Gonzalez            ACCOUNT NO.:  [id number]   MEDICAL RECORD NO.:  [id number]          PATIENT TYPE:  INP   LOCATION:  9310                          [facility]ILITY[facility]  [facility]   PHYSICIAN:  Zenaida Niece, M.D.DATE OF BIRTH:  Sep 09, 1961   DATE OF ADMISSION:  08/14/2004  DATE OF DISCHARGE:  08/16/2004                                 DISCHARGE SUMMARY   ADMISSION DIAGNOSIS:  Pelvic pain.   DISCHARGE DIAGNOSES:  1.  Pelvic pain.  2.  Abdominal adhesions.   PROCEDURE:  On August 15, 2004, she had a laparoscopy with adhesiolysis.   HISTORY AND PHYSICAL:  Please see chart for full history and physical, but  briefly this is a 50 year old white female, gravida 4, para 0-3-1-3, whom I  saw for the first time on August 14, 2004.  She presented to my office  with a complaint of pelvic pain.  She said that she was not able to function  and requested admission for pain medicine for pain control and surgical  evaluation.  She had been recently evaluated in 2 different emergency rooms  with no significant pathology found.   PAST HISTORY:  Past history is significant for 3 vaginal deliveries and  several laparoscopies over the past several years for ovarian cysts and  adhesions.  I did not have access to the past records at the time of her  admission.   PHYSICAL EXAM:  ABDOMEN:  Physical exam was significant for a tender abdomen  without palpable masses.  PELVIC:  On bimanual exam, she was diffusely tender without a palpable mass.   HOSPITAL COURSE:  The patient was admitted and put on Demerol and Phenergan  per the patient's request for pain control.  A laparoscopy was scheduled for  the afternoon of hospital day #2, August 15, 2004.  The patient did fall  on the evening of admission while getting up to go to the bathroom and  sustained abrasions to her left leg and right hip.  She remained afebrile  with stable vital signs and pelvic ultrasound was normal.  On the  afternoon  of August 15, 2004, she underwent a diagnostic laparoscopy which revealed  essentially normal pelvic anatomy with a 2-cm simple cyst on the right  ovary.  There were adhesions of the bowel to the right abdominal wall and to  the left pelvic brim and these were taken down.  I did not find any other  source for her pain.  She did not have a ride home that afternoon, so stayed  in the hospital the night of August 15, 2004.  On the morning of  August 16, 2004, she stated that her pain was better.  Her abdomen was  benign and her dressings were clean, dry and intact.  She was felt to be  stable enough for discharge home.   DISCHARGE INSTRUCTIONS:  Regular diet, pelvic rest.   FOLLOWUP:  Follow up in 2 weeks.   DISCHARGE MEDICATIONS:  Medications are:  1.  Demerol, #30, one to two p.o. q.4-6 h. p.r.n. pain.  2.  Phenergan 25 mg, #30, one p.o. q.6  h. p.r.n.     Todd   TDM/MEDQ  D:  09/03/2004  T:  09/04/2004  Job:  [id number]
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
## 2011-04-04 NOTE — H&P (Signed)
[facility]  Patient:    Brittney Gonzalez, Brittney Gonzalez                     MRN: [id number] Adm. Date:  02/03/01 Attending:  Fayrene Fearing A. Ashley Royalty, M.D.                         History and Physical                                This is a 50 year old gravida 4, para 3, AB1 who presented January 25, 2001 for annual examination.  At that time she also complained of right lower quadrant discomfort.  Duration was three to four weeks.  It has happened in the past.  It has happened sufficiently often that she has already had an appendectomy in order to remove that diagnosis from the differential.  She states it has been associated with fever, though she denies specifically taking the temperature with a thermometer.  It seems better with rest and also with Percocet given to her by another physician.  It seems worse with strenuous activities.  Also seems to be occasionally associated with nausea and vomiting.  She denies ever having any STDs and is currently involved in a mutually monogamous relationship.  Periods are currently every month with a five day flow and no cramps.  She denies other GI, GU, or GYN symptoms.  PAST MEDICAL HISTORY:         Negative.  PAST SURGICAL HISTORY:        Status post BTSP in 1985.  Also had an ovarian cystectomy presumably by laparoscopy, appendectomy.  ALLERGIES:                    Numerous including MORPHINE, SULFA, ULTRAM, DARVON, LORCET, CORTISONE, DOXYCYCLINE.  FAMILY HISTORY:               Positive for colon cancer, hypertension, diabetes.  SOCIAL HISTORY:               Patient denies use of tobacco or alcohol.  REVIEW OF SYSTEMS:            Noncontributory.  PHYSICAL EXAMINATION  GENERAL:                      Well-developed, well-nourished, pleasant white female in no acute distress.  VITAL SIGNS:                  Afebrile.  Vital signs stable.  SKIN:                         Warm and dry without lesions.  LYMPH:                         No supraclavicular, cervical, or inguinal adenopathy.  HEENT:                        Normocephalic.  NECK:                         Supple without thyromegaly.  CHEST:                        Lungs are clear.  CARDIAC:  Regular rate and rhythm without murmurs, gallops, or rubs.  BREASTS:                      Deferred.  ABDOMEN:                      Soft and minimally tender to direct palpation particularly in the right lower quadrant.  No masses or organomegaly.  Bowel sounds are active.  MUSCULOSKELETAL:              Full range of motion without edema, cyanosis, or CVA tenderness.  PELVIC:                       Deferred until examination under anesthesia.  LABORATORIES:                 Ultrasound was performed February 02, 2001 in the office and revealed essentially normal uterus perhaps with a less than 1 cm intramural fibroid.  The right adnexa was unremarkable.  The left adnexa contained perhaps a 1.8 cm cyst versus cystic follicle, echo free.  IMPRESSION:                   1. Left ovarian cyst versus cystic follicle.                               2. Right lower quadrant discomfort-etiology                                  uncertain.  Differential includes adnexal                                  process, adhesions, primary gastrointestinal,                                  musculoskeletal, etcetera.  PLAN:                         As patient states, the symptoms are sufficiently debilitating and she desires surgical intervention.  Will proceed with diagnostic/operative laparoscopy.  Risks, benefits, complications, and alternatives fully discussed with the patient.  The possible need for unilateral salpingo-oophorectomy discussed and accepted.  Possible need for exploratory laparotomy discussed and accepted.  Questions invited and answered. DD:  02/03/01 TD:  02/03/01 Job: [id number]

## 2011-04-04 NOTE — Discharge Summary (Signed)
Brittney Gonzalez, Brittney Gonzalez NO.:  [id number]   MEDICAL RECORD NO.:  [id number]          PATIENT TYPE:  INP   LOCATION:  3734                         FACILITY:  [facility]   PHYSICIAN:  Arturo Morton. Riley Kill, MD, FACCDATE OF BIRTH:  09-10-1961   DATE OF ADMISSION:  03/15/2007  DATE OF DISCHARGE:  03/17/2007                               DISCHARGE SUMMARY   PRIMARY CARDIOLOGIST:  Dr. Riley Kill.   PRIMARY CARE PHYSICIAN:  Dr. Dalbert Mayotte, Kathryne Sharper.   PRIMARY GYN:  Dr. Jackelyn Knife, [place].   PRINCIPAL DIAGNOSES:  1. Chest pain.  2. Hypokalemia.   SECONDARY DIAGNOSES:  1. Low abdominal and pelvic pain.  2. History of major depressive disorder.  3. History of opiate dependence.  4. Status post appendectomy.  5. Status post laparoscopic surgery for abdominal/pelvic adhesions.   ALLERGIES:  The patient reports intolerances to MORPHINE, PERCOCET,  LORCET, LORCET PLUS, TORADOL, VICODIN, cats, mole and dust.   PROCEDURE:  1. Transvaginal pelvic ultrasound.  2. 2-D echocardiogram.   HISTORY OF PRESENT ILLNESS:  A 50 year old, Caucasian female with the  above problem list who has a 2-3 month history of chest tightness and  shortness of breath who was seen in the office by Dr. Riley Kill March 15, 2007 following recurrent chest pain that was reproducible on exam. On  that same day, she had undergone an adenosine Myoview showing an EF of  73% without evidence of ischemia or infarct although her EKG was  abnormal with resting ST-T wave abnormalities and nondescript T wave  flattening. Despite this normal study in the setting of recurrent chest  pain and slightly abnormal EKG, a decision was made to admit her from  the office for further evaluation.   HOSPITAL COURSE:  Following admission, Brittney Gonzalez began frequently  requesting Demerol as well as IV Phenergan for nausea. Her chest pain  persisted and was reproducible throughout this admission. A 2-D  echocardiogram was  performed on April 29 which revealed an EF of 55-65%  without any regional wall motion abnormality. Also on April 29 she  complained of lower abdominal/pelvic discomfort which she said she had  in the past and for that reason a transvaginal pelvic ultrasound was  performed and this was normal. The patient wished to be seen by Dr.  Jackelyn Knife as an inpatient and following discussion with GYN, a decision  was made that in light of the normal pelvic ultrasound the patient could  followup as an outpatient. Again, Brittney Gonzalez continued to complain of  reproducible discomfort both in the left chest under the breast as well  as in the lower abdomen and frequently requested Demerol. We suspect  that she was exhibiting some drug seeking behavior. We have arranged for  her to followup with Dr. Jackelyn Knife today following discharge and with  Dr. Drue Second tomorrow.   Also during this admission, Brittney Gonzalez exhibited hypokalemia which  required oral potassium replacement.   DISCHARGE LABS:  Hemoglobin 12.8, hematocrit 37.3, WBC 7.8, platelets  224. Sodium 138, potassium 4.5, chloride 108, CO2 24, BUN 10, creatinine  0.96, glucose 128. Total bilirubin 0.4, alkaline phosphatase 73,  AST 35,  ALT 42. Total protein 7.1, albumin 3.8, calcium 9.9. CK 79, MB 1.0,  troponin I 0.01, __________  3.8. Urinalysis was negative.   DISPOSITION:  The patient is being discharged home today in fair  condition.   FOLLOWUP:  I will arrange for followup with Dr. Riley Kill in 2-4 weeks.  She is to followup with Dr. Jackelyn Knife today following discharge. She is  to followup with Dr. Dalbert Mayotte, M.D. in [place] on tomorrow.   DISCHARGE MEDICATIONS:  1. Lipitor 40 mg daily.  2. Xanax 1 mg as previously prescribed.  3. Prevacid 20 mg daily.  4. Spiriva 18 mcg daily.  5. Atrovent 2 puffs inhaled q.i.d.  6. Trazodone 200 mg q.h.s.   OUTSTANDING LAB STUDIES:  None.   DURATION DISCHARGE ENCOUNTER:  60 minutes including  physician time.      Nicolasa Ducking, ANP      Arturo Morton. Riley Kill, MD, [facility]  Electronically Signed    CB/MEDQ  D:  03/17/2007  T:  03/17/2007  Job:  [id number]   cc:   Zenaida Niece, M.D.  Dalbert Mayotte, M.D.

## 2011-04-04 NOTE — Op Note (Signed)
NAMEJACE, DOWE            ACCOUNT NO.:  [id number]   MEDICAL RECORD NO.:  [id number]          PATIENT TYPE:  INP   LOCATION:  9310                          FACILITY:  [facility]   PHYSICIAN:  Zenaida Niece, M.D.DATE OF BIRTH:  1961/02/23   DATE OF PROCEDURE:  08/15/2004  DATE OF DISCHARGE:  08/15/2004                                 OPERATIVE REPORT   PREOPERATIVE DIAGNOSES:  1.  Pelvic pain.  2.  Pelvic adhesions.   POSTOPERATIVE DIAGNOSES:  1.  Pelvic pain.  2.  Pelvic adhesions.   PROCEDURE:  Laparoscopy with adhesiolysis.   SURGEON:  Lavina Hamman, M.D.   ANESTHESIA:  General endotracheal tube.   ESTIMATED BLOOD LOSS:  Less than 50 mL.   FINDINGS:  Normal uterus, tubes, and ovaries with a simple 2 cm right  ovarian cyst.  The bowel was adherent to the right abdominal wall and the  sigmoid colon was slightly adherent to the left pelvic brim.  No evidence of  endometriosis or infection.   PROCEDURE IN DETAIL:  The patient was taken to the operating room and placed  in the dorsal supine position.  General anesthesia was induced and she was  placed in mobile stirrups.  A Foley catheter was inserted and a Hulka  tenaculum applied to the cervix for uterine manipulation.  Infraumbilical  skin was infiltrated with 0.25% Marcaine and a 3 cm horizontal incision was  made and carried down to the fascia.  The fascia was grasped with Kelly  clamps and incised sharply.  The peritoneum was grasped, elevated, and  entered sharply.  There were no adhesions palpated.  A pursestring suture of  0 Vicryl was placed around the fascia and the Hassan cannula inserted.  CO2  gas was insufflated and placement confirmed by the laparoscope.  A 5 mm port  was placed low in the midline under direct visualization.  Inspection  revealed the above-mentioned findings.  Sharp and blunt dissection were used  to take down the adhesions of the colon to the right abdominal wall.  The  adhesions of  the sigmoid to the left pelvic brim were also taken down a safe  distance.  The simple right ovarian cyst was ruptured with scissors.  The  was again no other evidence of endometriosis or infection or any cause for  acute pelvic pain.  All sites were adequately hemostatic.  The upper abdomen  appeared normal.  The 5 mm port was removed under direct visualization.  The  laparoscope was removed followed by the [facility] cannula.  All gas was allowed  to deflate from the abdomen.  The previously placed purse string suture was  tied.  There was felt to be a possible small fascial defect on the right  side and a figure-of-eight suture of 0 Vicryl was placed.  Skin incisions  were closed with subcuticular sutures of 4-0  Vicryl followed by Steri-Strips and Band-Aids.  The patient tolerated the  procedure well and was taken to recovery room in stable condition.  Prior to  leaving the operating room, the Hulka tenaculum was removed from the cervix  and the Foley catheter was removed.  Counts were correct.     Todd   TDM/MEDQ  D:  08/15/2004  T:  08/15/2004  Job:  [id number]

## 2011-04-04 NOTE — Op Note (Signed)
[facility]  Patient:    Brittney Gonzalez, Brittney Gonzalez                   MRN: [id number] Proc. Date: 02/03/01 Adm. Date:  [id number] Attending:  Wandalee Ferdinand                           Operative Report  PREOPERATIVE DIAGNOSES:       1. Right lower quadrant discomfort.                               2. Status post appendectomy.  POSTOPERATIVE DIAGNOSES:      1. Right lower quadrant discomfort.                               2. Status post appendectomy.                               3. Adhesions:  To right pelvic sidewall.  PROCEDURES:                   1. Diagnostic/operative laparoscopy.                               2. Lysis of adhesions.  SURGEON:                      Rudy Jew. Ashley Royalty, M.D.  ANESTHESIA:                   General.  ESTIMATED BLOOD LOSS:         Less than 25 cc.  COMPLICATIONS:                None.  PACKS AND DRAINS:             None.  DESCRIPTION OF PROCEDURE:     The patient was taken to the operating room and placed in the dorsal supine position.  After adequate general anesthesia was administered, she was placed in the lithotomy position and prepped and draped in the usual manner for abdominal and vaginal surgery.  Posterior weighted retractor was placed per vagina.  The anterior lip of the cervix was grasped with a single-tooth tenaculum.  Sterile uterine manipulator was placed per cervix and held in place with a tenaculum.  The bladder was drained with a red rubber catheter which was left in place throughout the procedure.  Next, a 1-1/2 cm infraumbilical incision was made in the transverse plane.  An open laparoscopic approach was entertained.  The fascia was sharply and bluntly opened in a transverse fashion.  The peritoneum was elevated with hemostats and entered atraumatically with Metzenbaum scissors.  Next, the atraumatic open laparoscopic trocar was placed in the abdominal cavity.  It was held in place with stay  sutures of 0 Vicryl placed in the fascia.  Laparoscope was introduced and pneumoperitoneum created with CO2.  A single left lower quadrant suprapubic trocar was placed using direct visualization and transillumination techniques.  The pelvis was thoroughly surveyed.  The uterus was normal in size, shape, and contour without evidence of any endometriosis or fibroids.  The right ovary was normal size, shape, and contour without any evidence  of cysts or endometriosis.  The right fallopian tube was separated from the uterus proximally but otherwise appeared normal.  The left ovary appeared to have a ruptured cyst associated with it.  Its overall dimensions were normal, and there was no evidence of any residual cyst or any endometriosis.  The left fallopian tube was separated from the uterus proximally, consistent with a previous tubal sterilization procedure.  It was otherwise normal.  The peritoneal surfaces were smooth and glistening.  The posterior cul-de-sac contained a pigmented lesion.  Appropriate photographs were obtained.  In the right lower quadrant, there were several adhesions from the colon to the right pelvic sidewall.  Appropriate photos were obtained.  Next, attention was turned to the biopsy of the posterior cul-de-sac.  The pigmented lesion was grasped and biopsied with scissors.  The ureters were noted to be well separated from the plane and biopsied.  The material biopsy turned out to be a surgical staple.  Hemostasis was noted from the biopsy site.  No tissue was submitted, as only a staple was recovered.  Next, using the bipolar cautery and scissors, the adhesions of the right colon to the right pelvic sidewall were lysed.  The area was thoroughly irrigated with using Nezhat suction irrigator.  Hemostasis was noted.  The remainder of the peritoneal surfaces were normal.  The patient was felt to have benefited maximally from the procedure at this point.  Abdominal  instruments were removed, and pneumoperitoneum evacuated.  Fascial defects were closed with 0 Vicryl in an interrupted fashion.  The skin was closed with 3-0 chromic in subcuticular fashion.  Approximately 10 cc of 0.25% Marcaine was instilled into the abdominal incisions to aid in postoperative analgesia.  Vaginal instruments were removed.  Hemostasis was noted.  The patient was taken to the recovery room in excellent condition. D:  02/03/01 TD:  02/04/01 Job: [id number]

## 2011-04-23 ENCOUNTER — Encounter (HOSPITAL_COMMUNITY): Payer: Medicare Other | Admitting: Licensed Clinical Social Worker

## 2011-04-29 ENCOUNTER — Encounter (INDEPENDENT_AMBULATORY_CARE_PROVIDER_SITE_OTHER): Payer: Medicare Other | Admitting: Licensed Clinical Social Worker

## 2011-04-29 DIAGNOSIS — F3162 Bipolar disorder, current episode mixed, moderate: Secondary | ICD-10-CM

## 2011-05-29 ENCOUNTER — Encounter (INDEPENDENT_AMBULATORY_CARE_PROVIDER_SITE_OTHER): Payer: Medicare Other | Admitting: Licensed Clinical Social Worker

## 2011-05-29 DIAGNOSIS — F3162 Bipolar disorder, current episode mixed, moderate: Secondary | ICD-10-CM

## 2011-07-02 ENCOUNTER — Encounter (INDEPENDENT_AMBULATORY_CARE_PROVIDER_SITE_OTHER): Payer: Medicare Other | Admitting: Licensed Clinical Social Worker

## 2011-07-02 DIAGNOSIS — F319 Bipolar disorder, unspecified: Secondary | ICD-10-CM

## 2011-07-11 ENCOUNTER — Encounter (HOSPITAL_COMMUNITY): Payer: Medicare Other | Admitting: Licensed Clinical Social Worker

## 2011-07-18 ENCOUNTER — Encounter (HOSPITAL_COMMUNITY): Payer: Medicare Other | Admitting: Licensed Clinical Social Worker

## 2011-08-05 ENCOUNTER — Encounter (HOSPITAL_COMMUNITY): Payer: Medicare Other | Admitting: Licensed Clinical Social Worker

## 2011-08-29 ENCOUNTER — Encounter (HOSPITAL_COMMUNITY): Payer: Medicare Other | Admitting: Licensed Clinical Social Worker

## 2011-10-02 ENCOUNTER — Encounter (HOSPITAL_COMMUNITY): Payer: Self-pay | Admitting: Licensed Clinical Social Worker

## 2011-10-02 ENCOUNTER — Ambulatory Visit (INDEPENDENT_AMBULATORY_CARE_PROVIDER_SITE_OTHER): Payer: Medicaid Other | Admitting: Licensed Clinical Social Worker

## 2011-10-02 DIAGNOSIS — F3160 Bipolar disorder, current episode mixed, unspecified: Secondary | ICD-10-CM

## 2011-10-02 NOTE — Patient Instructions (Addendum)
Stay more focussed on health - exercise and diet - increase fiber Find some ways of being with more people - consider a church, class to crochet

## 2011-10-03 DIAGNOSIS — F3160 Bipolar disorder, current episode mixed, unspecified: Secondary | ICD-10-CM | POA: Insufficient documentation

## 2011-10-03 NOTE — Progress Notes (Signed)
   THERAPIST PROGRESS NOTE  Session Time: 4:05 - 5:00 PM  Participation Level: Active  Behavioral Response: Fairly GroomedAlertAngry, Euthymic and Irritable  Type of Therapy: Individual Therapy  Treatment Goals addressed: Anger, Anxiety and Coping  Interventions: DBT, Solution Focused, Strength-based and Supportive  Summary: ARMA REINING is a 50 y.o. female who presents with bipolar problems.  Sangeeta is a lot like a child in her reactions.  Feels like Jonny Ruiz is trying to control her and gets into competition with him.. It is difficult to get her to stay on any routine for her health.  Exercise, diet and medications.  She over uses medications - at least Jonny Ruiz has gotten her off a lot of the pain meds and meds that cause drowsiness.  She is overusing Benedryl right now - refuses to try some other ways to help her rhinitis.  She goes to bed at the first sign of discomfort.  She babies herself and does not understand why Jonny Ruiz does not support that. Counselor gave positive feedback to Jonny Ruiz and tried to explain to [place] that if he accepted what she did it would be enabling her to stay weaker.  Some of her not feeling well is that she gives in to her aches and pains and does nothing to make herself feel better except take medicine or go to bed.  She did agree to start some exercise - walking for short periods and building up slowly. To see John's thoughts as encouragement not criticism..   Suicidal/Homicidal: Nowithout intent/plan  Plan: Return again in 4 weeks.  Diagnosis: Axis I: Bipolar, mixed    Axis II: Deferred    Bryelle Spiewak,JUDITH A, LCSW 10/03/2011

## 2011-10-03 NOTE — Progress Notes (Signed)
At [facility] appointment we did discuss her medications but she did not remember them.  She plans to bring the list next time for they have changed since she was admitted to therapy.

## 2011-10-06 ENCOUNTER — Emergency Department (INDEPENDENT_AMBULATORY_CARE_PROVIDER_SITE_OTHER)
Admission: EM | Admit: 2011-10-06 | Discharge: 2011-10-06 | Disposition: A | Discharge status: Home / Self Care | Payer: Medicare Other | Source: Home / Self Care | Attending: Emergency Medicine | Admitting: Emergency Medicine

## 2011-10-06 ENCOUNTER — Encounter: Payer: Self-pay | Admitting: Emergency Medicine

## 2011-10-06 DIAGNOSIS — R059 Cough, unspecified: Secondary | ICD-10-CM

## 2011-10-06 DIAGNOSIS — R05 Cough: Secondary | ICD-10-CM

## 2011-10-06 DIAGNOSIS — J069 Acute upper respiratory infection, unspecified: Secondary | ICD-10-CM

## 2011-10-06 DIAGNOSIS — R112 Nausea with vomiting, unspecified: Secondary | ICD-10-CM

## 2011-10-06 MED ORDER — GUAIFENESIN-CODEINE 100-10 MG/5ML PO SYRP: 5.0000 mL | ORAL_SOLUTION | Freq: Three times a day (TID) | ORAL | Status: AC | PRN

## 2011-10-06 MED ORDER — ONDANSETRON 4 MG PO TBDP: 4.0000 mg | ORAL_TABLET | Freq: Three times a day (TID) | ORAL | Status: AC | PRN

## 2011-10-06 MED ORDER — KETOROLAC TROMETHAMINE 30 MG/ML IM SOLN
30.0000 mg | Freq: Once | INTRAMUSCULAR | Status: AC
  Administered 2011-10-06: 30 mg via INTRAMUSCULAR

## 2011-10-06 MED ORDER — AMOXICILLIN 875 MG PO TABS: 875.0000 mg | ORAL_TABLET | Freq: Two times a day (BID) | ORAL | Status: AC

## 2011-10-06 NOTE — ED Provider Notes (Signed)
History     CSN: [id number] Arrival date & time: 10/06/2011  3:18 PM   First MD Initiated Contact with Patient 10/06/11 1532      Chief Complaint  Patient presents with  . Cough    (Consider location/radiation/quality/duration/timing/severity/associated sxs/prior treatment) HPI Elaura is a 50 y.o. female who complains of onset of cold symptoms for 7 days.  No sore throat + cough No pleuritic pain No wheezing + nasal congestion + post-nasal drainage + sinus pain/pressure No chest congestion No itchy/red eyes + R earache No hemoptysis No SOB + chills/sweats + fever + nausea + vomiting No abdominal pain No diarrhea No skin rashes + fatigue + myalgias + headache     Past Medical History  Diagnosis Date  . Hypertension   . Bipolar disorder   . Diabetes mellitus type II     Past Surgical History  Procedure Date  . Abdominal hysterectomy     Family History  Problem Relation Age of Onset  . Alcohol abuse Father     History  Substance Use Topics  . Smoking status: Never Smoker   . Smokeless tobacco: Never Used  . Alcohol Use: Yes     socially    OB History    Grav Para Term Preterm Abortions TAB SAB Ect Mult Living                  Review of Systems  Allergies  Ciprofloxacin; Hydrocodone-acetaminophen; Morphine sulfate; Oxycodone-acetaminophen; Propoxyphene hcl; Propoxyphene n-acetaminophen; Sulfonamide derivatives; and Tramadol hcl  Home Medications  No current outpatient prescriptions on file.  BP 119/75  Pulse 89  Temp(Src) 97.5 F (36.4 C) (Oral)  Resp 16  Ht 5\' 9"  (1.753 m)  Wt 184 lb (83.462 kg)  BMI 27.17 kg/m2  SpO2 97%  Physical Exam  Nursing note and vitals reviewed. Constitutional: She is oriented to person, place, and time. She appears well-developed and well-nourished.  HENT:  Head: Normocephalic and atraumatic.  Right Ear: External ear and ear canal normal. Tympanic membrane is bulging. Tympanic membrane is not  erythematous.  Left Ear: Tympanic membrane, external ear and ear canal normal.  Nose: Mucosal edema and rhinorrhea present.  Mouth/Throat: Posterior oropharyngeal erythema present. No oropharyngeal exudate or posterior oropharyngeal edema.  Neck: Neck supple. No rigidity. No Brudzinski's sign and no Kernig's sign noted.  Cardiovascular: Regular rhythm and normal heart sounds.   Pulmonary/Chest: Effort normal and breath sounds normal. No respiratory distress.  Neurological: She is alert and oriented to person, place, and time.  Skin: Skin is warm and dry.  Psychiatric: She has a normal mood and affect. Her speech is normal.    ED Course  Procedures (including critical care time)  Labs Reviewed - No data to display No results found.   No diagnosis found.    MDM   1)  Take the prescribed antibiotic as instructed.  Rx for cough meds, zofran given.  Pt requests toradol shot for facial pain/pressure be given.  I think it's ok, so will give 30mg  dose. 2)  Use nasal saline solution (over the counter) at least 3 times a day. 3)  Use over the counter decongestants like Zyrtec-D every 12 hours as needed to help with congestion.  If you have hypertension, do not take medicines with sudafed.  4)  Can take tylenol every 6 hours or motrin every 8 hours for pain or fever. 5)  Follow up with your primary doctor if no improvement in 5-7 days, sooner if increasing pain,  fever, or new symptoms.      Lily Kocher, MD 10/06/11 1534

## 2011-10-06 NOTE — ED Notes (Signed)
Cough, sneezing N&V and diarrhea, body aches x 1 week

## 2011-11-04 ENCOUNTER — Encounter: Payer: Self-pay | Admitting: *Deleted

## 2011-11-04 ENCOUNTER — Emergency Department (INDEPENDENT_AMBULATORY_CARE_PROVIDER_SITE_OTHER)
Admission: EM | Admit: 2011-11-04 | Discharge: 2011-11-04 | Disposition: A | Discharge status: Home / Self Care | Payer: Medicare Other | Source: Home / Self Care | Attending: Family Medicine | Admitting: Family Medicine

## 2011-11-04 DIAGNOSIS — R109 Unspecified abdominal pain: Secondary | ICD-10-CM

## 2011-11-04 HISTORY — DX: Anxiety disorder, unspecified: F41.9

## 2011-11-04 HISTORY — DX: Transient cerebral ischemic attack, unspecified: G45.9

## 2011-11-04 HISTORY — DX: Gastro-esophageal reflux disease without esophagitis: K21.9

## 2011-11-04 HISTORY — DX: Hyperlipidemia, unspecified: E78.5

## 2011-11-04 LAB — POCT URINALYSIS DIPSTICK
Glucose, UA: NEGATIVE
Nitrite, UA: NEGATIVE
Urobilinogen, UA: 2 (ref 0–1)

## 2011-11-04 LAB — CBC
MCH: 32.6 pg (ref 26.0–34.0)
MCV: 93.3 fL (ref 78.0–100.0)
Platelets: 235 10*3/uL (ref 150–400)
RBC: 4.45 MIL/uL (ref 3.87–5.11)
RDW: 12.5 % (ref 11.5–15.5)

## 2011-11-04 MED ORDER — ONDANSETRON HCL 4 MG/2ML IJ SOLN
4.0000 mg | Freq: Once | INTRAMUSCULAR | Status: AC
  Administered 2011-11-04: 4 mg via INTRAMUSCULAR

## 2011-11-04 MED ORDER — AMOXICILLIN-POT CLAVULANATE ER 1000-62.5 MG PO TB12: 2.0000 | ORAL_TABLET | Freq: Two times a day (BID) | ORAL | Status: AC

## 2011-11-04 MED ORDER — KETOROLAC TROMETHAMINE 10 MG PO TABS: 10.0000 mg | ORAL_TABLET | Freq: Four times a day (QID) | ORAL | Status: AC | PRN

## 2011-11-04 MED ORDER — ONDANSETRON HCL 4 MG PO TABS: 4.0000 mg | ORAL_TABLET | Freq: Four times a day (QID) | ORAL | Status: AC

## 2011-11-04 MED ORDER — KETOROLAC TROMETHAMINE 60 MG/2ML IM SOLN
60.0000 mg | Freq: Once | INTRAMUSCULAR | Status: AC
  Administered 2011-11-04: 60 mg via INTRAMUSCULAR

## 2011-11-04 NOTE — ED Notes (Signed)
Pt c/o abd pain, vomiting, low back pain, and painful/freq urination 2 mths. She has taken cipro, zpak, and PCN with no relief.

## 2011-11-04 NOTE — ED Provider Notes (Addendum)
History     CSN: [id number] Arrival date & time: 11/04/2011 12:28 PM   First MD Initiated Contact with Patient 11/04/11 1301      Chief Complaint  Patient presents with  . Urinary Frequency  . Back Pain      HPI Comments: Patient states that she has had UTI symptoms for about 3 months, and has had several courses of antibiotics without improvement. She complains of lower abdominal pain, and for about 4 to 5 days has had nausea with occasional vomiting.  Her bowel movements have been normal.  The history is provided by the patient.    Past Medical History  Diagnosis Date  . Hypertension   . Bipolar disorder   . Diabetes mellitus type II   . Anxiety   . Hyperlipemia   . GERD (gastroesophageal reflux disease)   . TIA (transient ischemic attack)     Past Surgical History  Procedure Date  . Abdominal hysterectomy   . Appendectomy     Family History  Problem Relation Age of Onset  . Alcohol abuse Father   . Hypertension Father   . Diabetes Father   . Aneurysm Father   . COPD Father   . COPD Mother   . Colon cancer Mother     History  Substance Use Topics  . Smoking status: Never Smoker   . Smokeless tobacco: Never Used  . Alcohol Use: Yes     socially    OB History    Grav Para Term Preterm Abortions TAB SAB Ect Mult Living                  Review of Systems  Constitutional: Positive for fever, chills and fatigue.  HENT: Positive for congestion and postnasal drip. Negative for sore throat.   Eyes: Negative.   Respiratory: Positive for cough.   Cardiovascular: Negative.   Gastrointestinal: Positive for nausea, vomiting and abdominal pain. Negative for diarrhea, constipation and blood in stool.  Genitourinary: Positive for dysuria and frequency.  Musculoskeletal: Negative.   Skin: Negative.     Allergies  Ciprofloxacin; Darvon; Hydrocodone-acetaminophen; Morphine sulfate; Oxycodone-acetaminophen; Propoxyphene hcl; Propoxyphene n-acetaminophen;  Sulfonamide derivatives; and Tramadol hcl  Home Medications   Current Outpatient Rx  Name Route Sig Dispense Refill  . ALPRAZOLAM 2 MG PO TABS Oral Take 2 mg by mouth at bedtime as needed.      . ASPIRIN 325 MG PO TABS Oral Take 325 mg by mouth daily.      . ATORVASTATIN CALCIUM 20 MG PO TABS Oral Take 20 mg by mouth daily.      Marland Kitchen GLIPIZIDE 10 MG PO TABS Oral Take 10 mg by mouth 2 (two) times daily before a meal.      . IBUPROFEN 800 MG PO TABS Oral Take 800 mg by mouth every 8 (eight) hours as needed.      Marland Kitchen LANSOPRAZOLE 30 MG PO CPDR Oral Take 30 mg by mouth daily.      Marland Kitchen SITAGLIPTIN PHOSPHATE 100 MG PO TABS Oral Take 100 mg by mouth daily.      . TRAZODONE HCL 100 MG PO TABS Oral Take 100 mg by mouth at bedtime.      . AMOXICILLIN-POT CLAVULANATE ER 1000-62.5 MG PO TB12 Oral Take 2 tablets by mouth 2 (two) times daily. 28 tablet 0  . KETOROLAC TROMETHAMINE 10 MG PO TABS Oral Take 1 tablet (10 mg total) by mouth every 6 (six) hours as needed for pain. 12 tablet  0  . ONDANSETRON HCL 4 MG PO TABS Oral Take 1 tablet (4 mg total) by mouth every 6 (six) hours. as needed for nausea 12 tablet 0    BP 111/73  Pulse 83  Temp(Src) 98.2 F (36.8 C) (Oral)  Resp 18  Ht 5\' 9"  (1.753 m)  Wt 182 lb 8 oz (82.781 kg)  BMI 26.95 kg/m2  SpO2 96%  Physical Exam  Nursing note and vitals reviewed. Constitutional: She is oriented to person, place, and time. She appears well-developed and well-nourished. No distress.       Patient is overweight (BMI 27)    HENT:  Head: Normocephalic.  Mouth/Throat: Oropharynx is clear and moist.       tympanic membranes are normal  Eyes: Conjunctivae are normal. Pupils are equal, round, and reactive to light.  Neck: Neck supple.  Cardiovascular: Regular rhythm and normal heart sounds.   Pulmonary/Chest: Effort normal and breath sounds normal. No respiratory distress. She has no wheezes. She has no rales.  Abdominal: Soft. She exhibits no distension and no mass.  There is tenderness. There is no rebound and no guarding.       There is mild tenderness over mid-abdomen at levels of ascending and descending colon  Musculoskeletal: She exhibits no edema and no tenderness.  Lymphadenopathy:    She has no cervical adenopathy.  Neurological: She is alert and oriented to person, place, and time.  Skin: Skin is warm and dry. No rash noted.  Psychiatric: She has a normal mood and affect.    ED Course  Procedures (including critical care time)   Labs Reviewed  POCT URINALYSIS DIPSTICK negative  CBC   Narrative:    Performed at:  [facility] [address]                [place], [place] [address]  URINE CULTURE pending      1. Abdominal pain       MDM  Note WBC at upper limit of normal.  ?Diverticulitis Toradol 60mg  and Zofran 4mg  given in office Urine culture pending Begin clear liquids for 24 hours and gradually advance.   Begin Augmentin XR for one week. Followup with PCP in 5 to 7 days. If symptoms become significantly worse during the night or over the weekend, proceed to the local emergency room.         Donna Christen, MD 11/06/11 [id number]  Donna Christen, MD 11/06/11 [phone]

## 2011-11-07 ENCOUNTER — Telehealth: Payer: Self-pay | Admitting: *Deleted

## 2011-11-07 LAB — URINE CULTURE: Colony Count: 30000

## 2011-11-29 ENCOUNTER — Emergency Department (INDEPENDENT_AMBULATORY_CARE_PROVIDER_SITE_OTHER)
Admission: EM | Admit: 2011-11-29 | Discharge: 2011-11-29 | Disposition: A | Discharge status: Home / Self Care | Payer: Medicare Other | Source: Home / Self Care | Attending: Emergency Medicine | Admitting: Emergency Medicine

## 2011-11-29 DIAGNOSIS — R300 Dysuria: Secondary | ICD-10-CM

## 2011-11-29 DIAGNOSIS — R110 Nausea: Secondary | ICD-10-CM

## 2011-11-29 DIAGNOSIS — R109 Unspecified abdominal pain: Secondary | ICD-10-CM

## 2011-11-29 LAB — POCT URINALYSIS DIPSTICK
Glucose, UA: NEGATIVE
Ketones, UA: NEGATIVE
Leukocytes, UA: NEGATIVE
Protein, UA: NEGATIVE
Spec Grav, UA: 1.02 (ref 1.005–1.03)
Urobilinogen, UA: 0.2 (ref 0–1)

## 2011-11-29 MED ORDER — PROMETHAZINE HCL 25 MG/ML IJ SOLN
12.5000 mg | Freq: Once | INTRAMUSCULAR | Status: AC
  Administered 2011-11-29: 12.5 mg via INTRAVENOUS

## 2011-11-29 MED ORDER — PROMETHAZINE HCL 25 MG/ML IJ SOLN
12.5000 mg | Freq: Four times a day (QID) | INTRAMUSCULAR | Status: DC | PRN
  Administered 2011-11-29 (×2): 12.5 mg via INTRAVENOUS

## 2011-11-29 MED ORDER — KETOROLAC TROMETHAMINE 30 MG/ML IM SOLN
30.0000 mg | INTRAMUSCULAR | Status: AC
  Administered 2011-11-29: 30 mg via INTRAMUSCULAR

## 2011-11-29 MED ORDER — CEFTRIAXONE SODIUM 250 MG IJ SOLR
250.0000 mg | Freq: Once | INTRAMUSCULAR | Status: AC
  Administered 2011-11-29: 250 mg via INTRAMUSCULAR

## 2011-11-29 MED ORDER — AMOXICILLIN 400 MG/5ML PO SUSR: 400.0000 mg | Freq: Three times a day (TID) | ORAL | Status: AC

## 2011-11-29 MED ORDER — KETOROLAC TROMETHAMINE 10 MG PO TABS: 10.0000 mg | ORAL_TABLET | Freq: Four times a day (QID) | ORAL | Status: AC | PRN

## 2011-11-29 MED ORDER — ONDANSETRON HCL 4 MG PO TABS
4.0000 mg | ORAL_TABLET | Freq: Four times a day (QID) | ORAL | Status: AC
Start: 2011-11-29 — End: 2011-12-06

## 2011-11-29 MED ORDER — PHENAZOPYRIDINE HCL 200 MG PO TABS: 200.0000 mg | ORAL_TABLET | Freq: Three times a day (TID) | ORAL | Status: DC

## 2011-11-29 MED ORDER — PHENAZOPYRIDINE HCL 200 MG PO TABS: 200.0000 mg | ORAL_TABLET | Freq: Three times a day (TID) | ORAL | Status: AC

## 2011-11-29 NOTE — ED Notes (Signed)
Patient c/o dysuria, bladder and low back pain x 3 days. No OTC meds used.

## 2011-11-29 NOTE — ED Provider Notes (Signed)
History     CSN: [id number]  Arrival date & time 11/29/11  1150   First MD Initiated Contact with Patient 11/29/11 1153      Chief Complaint  Patient presents with  . Dysuria    (Consider location/radiation/quality/duration/timing/severity/associated sxs/prior treatment) HPI Brittney Gonzalez is a 51 y.o. female who presents today with UTI symptoms for a few days.  + dysuria + frequency + urgency No hematuria No vaginal discharge No fever/chills + lower abdominal pain No back pain No fatigue  She also complains of some nausea from her discomfort.     Past Medical History  Diagnosis Date  . Hypertension   . Bipolar disorder   . Diabetes mellitus type II   . Anxiety   . Hyperlipemia   . GERD (gastroesophageal reflux disease)   . TIA (transient ischemic attack)     Past Surgical History  Procedure Date  . Abdominal hysterectomy   . Appendectomy     Family History  Problem Relation Age of Onset  . Alcohol abuse Father   . Hypertension Father   . Diabetes Father   . Aneurysm Father   . COPD Father   . COPD Mother   . Colon cancer Mother     History  Substance Use Topics  . Smoking status: Never Smoker   . Smokeless tobacco: Never Used  . Alcohol Use: Yes     socially    OB History    Grav Para Term Preterm Abortions TAB SAB Ect Mult Living                  Review of Systems  Allergies  Ciprofloxacin; Darvon; Hydrocodone-acetaminophen; Morphine sulfate; Oxycodone-acetaminophen; Propoxyphene hcl; Propoxyphene n-acetaminophen; Sulfonamide derivatives; and Tramadol hcl  Home Medications   Current Outpatient Rx  Name Route Sig Dispense Refill  . ALPRAZOLAM 2 MG PO TABS Oral Take 2 mg by mouth at bedtime as needed.      . AMOXICILLIN 400 MG/5ML PO SUSR Oral Take 5 mLs (400 mg total) by mouth 3 (three) times daily. Take 6cc PO TID for 7 days, Disp QS 100 mL 0  . ASPIRIN 325 MG PO TABS Oral Take 325 mg by mouth daily.      . ATORVASTATIN CALCIUM 20 MG PO  TABS Oral Take 20 mg by mouth daily.      Marland Kitchen GLIPIZIDE 10 MG PO TABS Oral Take 10 mg by mouth 2 (two) times daily before a meal.      . IBUPROFEN 800 MG PO TABS Oral Take 800 mg by mouth every 8 (eight) hours as needed.      Marland Kitchen LANSOPRAZOLE 30 MG PO CPDR Oral Take 30 mg by mouth daily.      Marland Kitchen ONDANSETRON HCL 4 MG PO TABS Oral Take 1 tablet (4 mg total) by mouth every 6 (six) hours. 12 tablet 0  . PHENAZOPYRIDINE HCL 200 MG PO TABS Oral Take 1 tablet (200 mg total) by mouth 3 (three) times daily. 6 tablet 0  . SITAGLIPTIN PHOSPHATE 100 MG PO TABS Oral Take 100 mg by mouth daily.      . TRAZODONE HCL 100 MG PO TABS Oral Take 100 mg by mouth at bedtime.        BP 156/84  Pulse 96  Temp(Src) 99 F (37.2 C) (Oral)  Resp 16  Wt 182 lb (82.555 kg)  SpO2 99%  Physical Exam  Nursing note and vitals reviewed. Constitutional: She is oriented to person, place, and time. She  appears well-developed and well-nourished.  HENT:  Head: Normocephalic and atraumatic.  Eyes: No scleral icterus.  Neck: Neck supple.  Cardiovascular: Regular rhythm and normal heart sounds.   Pulmonary/Chest: Effort normal and breath sounds normal. No respiratory distress.  Abdominal: Soft. Normal appearance and bowel sounds are normal. She exhibits no mass. There is tenderness in the suprapubic area. There is no rigidity, no rebound, no guarding, no CVA tenderness, no tenderness at McBurney's point and negative Murphy's sign.    Neurological: She is alert and oriented to person, place, and time.  Skin: Skin is warm and dry.  Psychiatric: She has a normal mood and affect. Her speech is normal.    ED Course  Procedures (including critical care time)  Labs Reviewed - No data to display No results found.   1. Dysuria   2. Nausea   3. Abdominal pain       MDM   1) Take the prescribed antibiotic as directed. 2) A urinalysis was done in clinic.  A urine culture is pending. 3) Follow up with your PCP or urologist  if not improving or if worsening symptoms.   I think there is more to this than just simply urinary tract infection. She may be having some episodes of either colitis or other abdominal discomfort. I would like her to followup with her primary care Dr. discuss her recurrent pain. If not, then she needs to talk to her urologist and see if there is anything else going on.  Lily Kocher, MD 11/29/11 1216

## 2011-12-02 ENCOUNTER — Telehealth: Payer: Self-pay | Admitting: *Deleted

## 2011-12-02 LAB — URINE CULTURE: Colony Count: >=100000

## 2011-12-23 ENCOUNTER — Encounter (HOSPITAL_COMMUNITY): Payer: Self-pay | Admitting: Licensed Clinical Social Worker

## 2011-12-23 ENCOUNTER — Ambulatory Visit (INDEPENDENT_AMBULATORY_CARE_PROVIDER_SITE_OTHER): Payer: Medicare Other | Admitting: Licensed Clinical Social Worker

## 2011-12-23 DIAGNOSIS — F319 Bipolar disorder, unspecified: Secondary | ICD-10-CM

## 2011-12-23 NOTE — Patient Instructions (Signed)
Look into volunteer activities with older people. Stay active and doing things with Jonny Ruiz

## 2011-12-23 NOTE — Progress Notes (Signed)
   THERAPIST PROGRESS NOTE  Session Time: 2:00 - 2:50  Participation Level: Active  Behavioral Response: DisheveledAlertEuthymic  Type of Therapy: Individual Therapy  Treatment Goals addressed: Anxiety and Coping  Interventions: Solution Focused, Strength-based and Supportive  Summary: Brittney Gonzalez is a 51 y.o. female who presents with taking care of self appropriately.  Today she is more alert because she has cut back a lot on medications which make her drowsy.  She is feeling better with her GI problems - Jonny Ruiz has helped her with both problems and she is doing better because she trusted him enough to do what he asked.  It took her awhile for her first reaction is like a child who is being told what to do and she does not like it.  She has a lot anxiety about dong things on her own.  She likes John to be with her and he does encourage her to do things on her own.  He will go to get her started and then back off when she is ready.  They are doing more things together that are fun - she used to hang out in the [place] and watch movies and he would play his video games.  Now they are out and about doing things and they play games together - monopoly and card games.  She is learning that one of her friends was not really a friend.  She tends to want to trust too fast.  She also has a tendency to feel she needs to buy love - she is perpetually giving small gifts to people - she seems to do this because of her own needs.  She does well with older people so discussed ways she could volunteer with the older generation - Visiting shut ins or people who have no one in nursing homes.  She is going to consider.  She would like to do more.   She is liking being more active.   Suicidal/Homicidal: Nowithout intent/plan  Plan: Return again in 8 weeks.  Diagnosis: Axis I: Bipolar, mixed    Axis II: Deferred    Benedetto Ryder,JUDITH A, LCSW 12/23/2011

## 2012-02-17 ENCOUNTER — Encounter (HOSPITAL_COMMUNITY): Payer: Self-pay | Admitting: Licensed Clinical Social Worker

## 2012-02-17 ENCOUNTER — Ambulatory Visit (INDEPENDENT_AMBULATORY_CARE_PROVIDER_SITE_OTHER): Payer: Medicare Other | Admitting: Licensed Clinical Social Worker

## 2012-02-17 DIAGNOSIS — F3160 Bipolar disorder, current episode mixed, unspecified: Secondary | ICD-10-CM
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
## 2012-04-20 ENCOUNTER — Ambulatory Visit (HOSPITAL_COMMUNITY): Payer: Self-pay | Admitting: Licensed Clinical Social Worker

## 2012-05-04 ENCOUNTER — Ambulatory Visit (HOSPITAL_COMMUNITY): Payer: Medicare Other | Admitting: Licensed Clinical Social Worker

## 2012-05-04 NOTE — Progress Notes (Unsigned)
   THERAPIST PROGRESS NOTE  Session Time: 1:15 - 2:00  Participation Level: Active  Behavioral Response: CasualAlertEuthymic  Type of Therapy: Individual Therapy  Treatment Goals addressed: Anxiety  Interventions: Motivational Interviewing and Supportive  Summary: Brittney Gonzalez is a 51 y.o. female who presents with ***.   Suicidal/Homicidal: No  Plan: Return again in *** weeks.  Diagnosis: Axis I: {psych axis 1:31909}    Axis II: {psych axis 2:31910}    Murray Durrell,JUDITH A, LCSW 05/04/2012

## 2012-06-02 ENCOUNTER — Emergency Department (INDEPENDENT_AMBULATORY_CARE_PROVIDER_SITE_OTHER)
Admission: EM | Admit: 2012-06-02 | Discharge: 2012-06-02 | Disposition: A | Discharge status: Home / Self Care | Payer: Medicare Other | Source: Home / Self Care | Attending: Family Medicine | Admitting: Family Medicine

## 2012-06-02 ENCOUNTER — Encounter: Payer: Self-pay | Admitting: *Deleted

## 2012-06-02 DIAGNOSIS — J209 Acute bronchitis, unspecified: Secondary | ICD-10-CM

## 2012-06-02 LAB — POCT CBC W AUTO DIFF (K'VILLE URGENT CARE)

## 2012-06-02 LAB — POCT MONO SCREEN (KUC): Mono, POC: NEGATIVE

## 2012-06-02 LAB — POCT RAPID STREP A (OFFICE): Rapid Strep A Screen: NEGATIVE

## 2012-06-02 MED ORDER — GUAIFENESIN-CODEINE 100-10 MG/5ML PO SYRP: 10.0000 mL | ORAL_SOLUTION | Freq: Every day | ORAL | Status: AC

## 2012-06-02 MED ORDER — KETOROLAC TROMETHAMINE 10 MG PO TABS: 10.0000 mg | ORAL_TABLET | Freq: Four times a day (QID) | ORAL | Status: AC | PRN

## 2012-06-02 MED ORDER — CLARITHROMYCIN 500 MG PO TABS: 500.0000 mg | ORAL_TABLET | Freq: Two times a day (BID) | ORAL | Status: AC

## 2012-06-02 MED ORDER — KETOROLAC TROMETHAMINE 30 MG/ML IM SOLN
30.0000 mg | Freq: Once | INTRAMUSCULAR | Status: AC | PRN
  Administered 2012-06-02: 30 mg via INTRAMUSCULAR

## 2012-06-02 NOTE — ED Provider Notes (Signed)
History     CSN: [id number]  Arrival date & time 06/02/12  1459   First MD Initiated Contact with Patient 06/02/12 1521      Chief Complaint  Patient presents with  . Sore Throat  . Chills      HPI Comments: Patient complains of approximately 10 day history of gradually progressive URI symptoms beginning with a mild sore throat (now persistent), followed by progressive nasal congestion.  A cough started next.  Her symptoms have become worse over the past 3 days.   Complains of fatigue and persistent myalgias.  Cough is now worse at night and generally non-productive during the day.  She states that she often coughs until she gags.  She complains of shortness of breath and wheezing with activity.  She also complains of bilateral earache.  She does not remember her last tetanus immunization. She states that she has a follow-up appt with her PCP in about 5 days.  The history is provided by the patient.    Past Medical History  Diagnosis Date  . Hypertension   . Bipolar disorder   . Diabetes mellitus type II   . Anxiety   . Hyperlipemia   . GERD (gastroesophageal reflux disease)   . TIA (transient ischemic attack)     Past Surgical History  Procedure Date  . Abdominal hysterectomy   . Appendectomy     Family History  Problem Relation Age of Onset  . Alcohol abuse Father   . Hypertension Father   . Diabetes Father   . Aneurysm Father   . COPD Father   . COPD Mother   . Colon cancer Mother     History  Substance Use Topics  . Smoking status: Never Smoker   . Smokeless tobacco: Never Used  . Alcohol Use: Yes     socially    OB History    Grav Para Term Preterm Abortions TAB SAB Ect Mult Living                  Review of Systems + sore throat + cough No pleuritic pain + wheezing + nasal congestion + post-nasal drainage ? sinus pain/pressure No itchy/red eyes ? earache No hemoptysis + SOB with activity No fever, + chills + nausea No vomiting No  abdominal pain No diarrhea No urinary symptoms No skin rashes + fatigue + myalgias + headache Used OTC meds without relief  Allergies  Ciprofloxacin; Darvon; Hydrocodone-acetaminophen; Morphine sulfate; Oxycodone-acetaminophen; Propoxyphene hcl; Propoxyphene-acetaminophen; Sulfonamide derivatives; and Tramadol hcl  Home Medications   Current Outpatient Rx  Name Route Sig Dispense Refill  . ACETAMINOPHEN-CODEINE #3 300-30 MG PO TABS Oral Take 1 tablet by mouth 3 (three) times daily.    Marland Kitchen ALPRAZOLAM 2 MG PO TABS Oral Take 2 mg by mouth at bedtime as needed.      . ALPRAZOLAM 2 MG PO TABS Oral Take 2 mg by mouth QID.    Marland Kitchen ASPIRIN 325 MG PO TABS Oral Take 325 mg by mouth daily.      . ATORVASTATIN CALCIUM 20 MG PO TABS Oral Take 20 mg by mouth daily.      Marland Kitchen CLARITHROMYCIN 500 MG PO TABS Oral Take 1 tablet (500 mg total) by mouth 2 (two) times daily. 14 tablet 0  . CYCLOBENZAPRINE HCL 10 MG PO TABS Oral Take 10 mg by mouth 3 (three) times daily as needed.    Marland Kitchen DIPHENHYDRAMINE HCL (SLEEP) 25 MG PO TABS Oral Take 25 mg by mouth  at bedtime as needed.    Marland Kitchen GLIPIZIDE 10 MG PO TABS Oral Take 10 mg by mouth 2 (two) times daily before a meal.      . GUAIFENESIN-CODEINE 100-10 MG/5ML PO SYRP Oral Take 10 mLs by mouth at bedtime. for cough 120 mL 0  . IBUPROFEN 800 MG PO TABS Oral Take 800 mg by mouth every 8 (eight) hours as needed.      . IPRATROPIUM BROMIDE 0.02 % IN SOLN Nebulization Take 500 mcg by nebulization 4 (four) times daily.    Marland Kitchen KETOROLAC TROMETHAMINE 10 MG PO TABS Oral Take 1 tablet (10 mg total) by mouth every 6 (six) hours as needed for pain. 10 tablet 0  . LANSOPRAZOLE 30 MG PO CPDR Oral Take 30 mg by mouth daily.      Marland Kitchen METFORMIN HCL 1000 MG PO TABS Oral Take 1,000 mg by mouth 2 (two) times daily with a meal.    . SITAGLIPTIN PHOSPHATE 100 MG PO TABS Oral Take 100 mg by mouth daily.      . TRAZODONE HCL 100 MG PO TABS Oral Take 100 mg by mouth at bedtime.        BP 107/73   Pulse 95  Temp 97.9 F (36.6 C) (Oral)  Resp 16  Ht 5\' 9"  (1.753 m)  Wt 172 lb (78.019 kg)  BMI 25.40 kg/m2  SpO2 97%  Physical Exam Nursing notes and Vital Signs reviewed. Appearance:  Patient appears stated age, and in no acute distress Eyes:  Pupils are equal, round, and reactive to light and accomodation.  Extraocular movement is intact.  Conjunctivae are not inflamed  Ears:  Canals normal.  Tympanic membranes normal.  TMJ's are tender to palpation bilaterally. Nose:  Mildly congested turbinates.  Maxillary sinus tenderness is present.  Pharynx:  Normal Neck:  Supple.   Tender shotty anterior/posterior nodes are palpated bilaterally  Lungs:  Clear to auscultation.  Breath sounds are equal.  Heart:  Regular rate and rhythm without murmurs, rubs, or gallops.  Abdomen:  Tenderness over spleen and RUQ without masses or hepatosplenomegaly.  Bowel sounds are present.  No CVA or flank tenderness.  Extremities:  No edema.  No calf tenderness Skin:  No rash present.   ED Course  Procedures none   Labs Reviewed  POCT RAPID STREP A (OFFICE) negative  POCT CBC W AUTO DIFF ([facility])  WBC 7.9; LY 30.9; MO 7.8; GR 61.3; Hgb 13.6; Platelets 155   POCT MONO SCREEN ([facility]) negative      1. Acute bronchitis; ? pertussis      MDM  Patient requested injection of Toradol for her myalgias; administered 30mg  IM  Begin Biaxin.  Cough suppressant at bedtime (patient notes that she has taken codeine without adverse effects).  May continue oral Toradol as needed for two days Take plain Mucinex (guaifenesin) twice daily for cough and congestion.  Increase fluid intake, rest. May use Afrin nasal spray (or generic oxymetazoline) twice daily for about 5 days.  Also recommend using saline nasal spray several times daily and saline nasal irrigation (AYR is a common brand) Stop all antihistamines for now, and other non-prescription cough/cold preparations. Followup with PCP as  scheduled        Lattie Haw, MD 06/02/12 1954

## 2012-06-02 NOTE — ED Notes (Signed)
Patient c/o chills/sweats, body aches, ear pain and fatigue since 05/21/12.

## 2012-06-04 ENCOUNTER — Telehealth: Payer: Self-pay | Admitting: Family Medicine

## 2012-06-19 ENCOUNTER — Encounter: Payer: Self-pay | Admitting: Emergency Medicine

## 2012-06-19 ENCOUNTER — Emergency Department (INDEPENDENT_AMBULATORY_CARE_PROVIDER_SITE_OTHER)
Admission: EM | Admit: 2012-06-19 | Discharge: 2012-06-19 | Disposition: A | Discharge status: Home / Self Care | Payer: Medicare Other | Source: Home / Self Care | Attending: Family Medicine | Admitting: Family Medicine

## 2012-06-19 DIAGNOSIS — N3000 Acute cystitis without hematuria: Secondary | ICD-10-CM

## 2012-06-19 LAB — POCT URINALYSIS DIP (MANUAL ENTRY)
Leukocytes, UA: NEGATIVE
Nitrite, UA: NEGATIVE
Protein Ur, POC: NEGATIVE
Urobilinogen, UA: 0.2 (ref 0–1)
pH, UA: 5.5 (ref 5–8)

## 2012-06-19 MED ORDER — CEPHALEXIN 500 MG PO CAPS: 500.0000 mg | ORAL_CAPSULE | Freq: Two times a day (BID) | ORAL | Status: AC

## 2012-06-19 MED ORDER — KETOROLAC TROMETHAMINE 10 MG PO TABS: 10.0000 mg | ORAL_TABLET | Freq: Four times a day (QID) | ORAL | Status: AC | PRN

## 2012-06-19 MED ORDER — PHENAZOPYRIDINE HCL 200 MG PO TABS: 200.0000 mg | ORAL_TABLET | Freq: Three times a day (TID) | ORAL | Status: AC

## 2012-06-19 MED ORDER — KETOROLAC TROMETHAMINE 30 MG/ML IM SOLN
30.0000 mg | Freq: Once | INTRAMUSCULAR | Status: AC
  Administered 2012-06-19: 30 mg via INTRAMUSCULAR

## 2012-06-19 NOTE — ED Provider Notes (Signed)
History     CSN: [id number]  Arrival date & time 06/19/12  1720   First MD Initiated Contact with Patient 06/19/12 1742      Chief Complaint  Patient presents with  . Dysuria  . Urinary Frequency      HPI Comments: Patient complains of onset of dysuria, frequency, and nocturia about 5 days ago.  She complains of lower abdominal discomfort.  No pelvic pain.  Symptoms slightly improved with AZO  Patient is a 52 y.o. female presenting with dysuria. The history is provided by the patient.  Dysuria  This is a new problem. Episode onset: 5 days ago. The problem occurs every urination. The problem has been gradually worsening. The quality of the pain is described as burning. The pain is moderate. There has been no fever. Associated symptoms include chills, frequency, urgency and flank pain. Pertinent negatives include no sweats, no nausea, no vomiting, no discharge and no hematuria. Treatments tried: AZO.    Past Medical History  Diagnosis Date  . Hypertension   . Bipolar disorder   . Diabetes mellitus type II   . Anxiety   . Hyperlipemia   . GERD (gastroesophageal reflux disease)   . TIA (transient ischemic attack)     Past Surgical History  Procedure Date  . Abdominal hysterectomy   . Appendectomy     Family History  Problem Relation Age of Onset  . Alcohol abuse Father   . Hypertension Father   . Diabetes Father   . Aneurysm Father   . COPD Father   . COPD Mother   . Colon cancer Mother     History  Substance Use Topics  . Smoking status: Never Smoker   . Smokeless tobacco: Never Used  . Alcohol Use: Yes     socially    OB History    Grav Para Term Preterm Abortions TAB SAB Ect Mult Living                  Review of Systems  Constitutional: Positive for chills.  Gastrointestinal: Negative for nausea and vomiting.  Genitourinary: Positive for dysuria, urgency, frequency and flank pain. Negative for hematuria.  All other systems reviewed and are  negative.    Allergies  Ciprofloxacin; Darvon; Hydrocodone-acetaminophen; Morphine sulfate; Oxycodone-acetaminophen; Propoxyphene hcl; Propoxyphene-acetaminophen; Sulfonamide derivatives; and Tramadol hcl  Home Medications   Current Outpatient Rx  Name Route Sig Dispense Refill  . ACETAMINOPHEN-CODEINE #3 300-30 MG PO TABS Oral Take 1 tablet by mouth 3 (three) times daily.    Marland Kitchen ALPRAZOLAM 2 MG PO TABS Oral Take 2 mg by mouth at bedtime as needed.      . ALPRAZOLAM 2 MG PO TABS Oral Take 2 mg by mouth QID.    Marland Kitchen ASPIRIN 325 MG PO TABS Oral Take 325 mg by mouth daily.      . ATORVASTATIN CALCIUM 20 MG PO TABS Oral Take 20 mg by mouth daily.      . CEPHALEXIN 500 MG PO CAPS Oral Take 1 capsule (500 mg total) by mouth 2 (two) times daily. 14 capsule 0  . CYCLOBENZAPRINE HCL 10 MG PO TABS Oral Take 10 mg by mouth 3 (three) times daily as needed.    Marland Kitchen DIPHENHYDRAMINE HCL (SLEEP) 25 MG PO TABS Oral Take 25 mg by mouth at bedtime as needed.    Marland Kitchen GLIPIZIDE 10 MG PO TABS Oral Take 10 mg by mouth 2 (two) times daily before a meal.      .  IBUPROFEN 800 MG PO TABS Oral Take 800 mg by mouth every 8 (eight) hours as needed.      . IPRATROPIUM BROMIDE 0.02 % IN SOLN Nebulization Take 500 mcg by nebulization 4 (four) times daily.    Marland Kitchen LANSOPRAZOLE 30 MG PO CPDR Oral Take 30 mg by mouth daily.      Marland Kitchen METFORMIN HCL 1000 MG PO TABS Oral Take 1,000 mg by mouth 2 (two) times daily with a meal.    . PHENAZOPYRIDINE HCL 200 MG PO TABS Oral Take 1 tablet (200 mg total) by mouth 3 (three) times daily. 6 tablet 0  . SITAGLIPTIN PHOSPHATE 100 MG PO TABS Oral Take 100 mg by mouth daily.      . TRAZODONE HCL 100 MG PO TABS Oral Take 100 mg by mouth at bedtime.        BP 110/70  Pulse 81  Temp 98 F (36.7 C) (Oral)  Resp 18  Ht 5\' 9"  (1.753 m)  Wt 167 lb (75.751 kg)  BMI 24.66 kg/m2  SpO2 97%  Physical Exam Nursing notes and Vital Signs reviewed. Appearance:  Patient appears stated age, and in no acute  distress Eyes:  Pupils are equal, round, and reactive to light and accomodation.  Extraocular movement is intact.  Conjunctivae are not inflamed  Pharynx:  Normal; moist mucous membranes  Neck:  Supple.  No adenopathy Lungs:  Clear to auscultation.  Breath sounds are equal.  Heart:  Regular rate and rhythm without murmurs, rubs, or gallops.  Abdomen:   Mild suprapubic without masses or hepatosplenomegaly.  Bowel sounds are present.  No CVA tenderness.  Mild flank tenderness present.  Extremities:  No edema.  No calf tenderness Skin:  No rash present.   ED Course  Procedures  none   Labs Reviewed  POCT URINALYSIS DIP (MANUAL ENTRY) trace blood otherwise negative  URINE CULTURE      1. Acute cystitis       MDM  Patient requests Toradol for pain: Toradol 30mg  IM, then 10mg  PO q6hr prn #12 Urine culture pending. Begin Keflex and Pyridium Continue increased fluid intake. Followup with Family Doctor if not improved in one week.         Lattie Haw, MD 06/19/12 [phone]

## 2012-06-19 NOTE — ED Notes (Signed)
Patient states has experienced dysuria and frequency x 4 days; used AZO and ran out yesterday.

## 2012-06-20 LAB — URINE CULTURE: Colony Count: 4000

## 2012-06-22 ENCOUNTER — Telehealth: Payer: Self-pay | Admitting: *Deleted

## 2012-07-03 ENCOUNTER — Emergency Department (INDEPENDENT_AMBULATORY_CARE_PROVIDER_SITE_OTHER)
Admission: EM | Admit: 2012-07-03 | Discharge: 2012-07-03 | Disposition: A | Discharge status: Home / Self Care | Payer: Medicare Other | Source: Home / Self Care | Attending: Family Medicine | Admitting: Family Medicine

## 2012-07-03 DIAGNOSIS — R112 Nausea with vomiting, unspecified: Secondary | ICD-10-CM

## 2012-07-03 DIAGNOSIS — R109 Unspecified abdominal pain: Secondary | ICD-10-CM

## 2012-07-03 DIAGNOSIS — R300 Dysuria: Secondary | ICD-10-CM

## 2012-07-03 LAB — POCT CBC W AUTO DIFF (K'VILLE URGENT CARE)

## 2012-07-03 LAB — POCT URINALYSIS DIP (MANUAL ENTRY)
Blood, UA: NEGATIVE
Ketones, POC UA: NEGATIVE
Protein Ur, POC: NEGATIVE
Spec Grav, UA: 1.015 (ref 1.005–1.03)
Urobilinogen, UA: 0.2 (ref 0–1)
pH, UA: 6 (ref 5–8)

## 2012-07-03 MED ORDER — KETOROLAC TROMETHAMINE 30 MG/ML IM SOLN
30.0000 mg | Freq: Once | INTRAMUSCULAR | Status: AC | PRN
  Administered 2012-07-03: 30 mg via INTRAMUSCULAR

## 2012-07-03 MED ORDER — PROMETHAZINE HCL 25 MG/ML IJ SOLN
25.0000 mg | Freq: Once | INTRAMUSCULAR | Status: AC
  Administered 2012-07-03: 25 mg via INTRAMUSCULAR

## 2012-07-03 MED ORDER — PROMETHAZINE HCL 25 MG PO TABS: 25.0000 mg | ORAL_TABLET | Freq: Four times a day (QID) | ORAL | Status: DC | PRN

## 2012-07-03 MED ORDER — KETOROLAC TROMETHAMINE 10 MG PO TABS: 10.0000 mg | ORAL_TABLET | Freq: Four times a day (QID) | ORAL | Status: AC | PRN

## 2012-07-03 NOTE — ED Notes (Signed)
Brittney Gonzalez complains of vomiting, chills, low back pain, pelvic pain and pain after urination for 1 day.

## 2012-07-03 NOTE — ED Provider Notes (Signed)
History     CSN: [id number]  Arrival date & time 07/03/12  1117   None     Chief Complaint  Patient presents with  . Emesis    x 1 day  . Chills  . Dysuria      HPI Comments: Patient returns for follow-up after [place] the beach for two [place].  She states that she had little improvement in her symptoms reported during her previous visit two weeks ago.  She notes that Pyridium helped somewhat with her urinary symptoms.  Over the past two weeks, she has had recurring nausea with occasional vomiting.  There have been no changes in her bowel movements.  She continues to have dysuria and diffuse upper/lower abdominal pain.  Her abdominal symptoms are not exacerbated by eating.  She notes that she is taking fluids without difficulty.  Over the past two days she has had recurring chills but no fever.  No respiratory symptoms except for occasional cough. She reports that she has an appt with her PCP Dr. Ilsa Iha on 07/06/12 at 3PM. Her past surgical history includes hysterectomy and right oophorectomy for menorrhagia (left ovary remains) and appendectomy.  She states that she had a negative colonoscopy and endoscopy about two years ago  Patient is a 51 y.o. female presenting with abdominal pain. The history is provided by the patient.  Abdominal Pain The primary symptoms of the illness include abdominal pain, fatigue, nausea, vomiting and dysuria. The primary symptoms of the illness do not include fever, shortness of breath, diarrhea, hematemesis, hematochezia, vaginal discharge or vaginal bleeding. Episode onset: 2 weeks ago. The problem has been gradually worsening.  The dysuria is associated with frequency and urgency. The dysuria is not associated with hematuria.  The patient has not had a change in bowel habit. Additional symptoms associated with the illness include chills, anorexia, urgency, frequency and back pain. Symptoms associated with the illness do not include diaphoresis,  heartburn, constipation or hematuria.    Past Medical History  Diagnosis Date  . Hypertension   . Bipolar disorder   . Diabetes mellitus type II   . Anxiety   . Hyperlipemia   . GERD (gastroesophageal reflux disease)   . TIA (transient ischemic attack)     Past Surgical History  Procedure Date  . Abdominal hysterectomy   . Appendectomy     Family History  Problem Relation Age of Onset  . Alcohol abuse Father   . Hypertension Father   . Diabetes Father   . Aneurysm Father   . COPD Father   . COPD Mother   . Colon cancer Mother     History  Substance Use Topics  . Smoking status: Never Smoker   . Smokeless tobacco: Never Used  . Alcohol Use: Yes     socially    OB History    Grav Para Term Preterm Abortions TAB SAB Ect Mult Living                  Review of Systems  Constitutional: Positive for chills and fatigue. Negative for fever and diaphoresis.  Respiratory: Negative for shortness of breath.   Gastrointestinal: Positive for nausea, vomiting, abdominal pain and anorexia. Negative for heartburn, diarrhea, constipation, hematochezia and hematemesis.  Genitourinary: Positive for dysuria, urgency and frequency. Negative for hematuria, vaginal bleeding and vaginal discharge.  Musculoskeletal: Positive for back pain.  All other systems reviewed and are negative.    Allergies  Ciprofloxacin; Darvon; Hydrocodone-acetaminophen; Morphine sulfate; Oxycodone-acetaminophen; Propoxyphene  hcl; Propoxyphene-acetaminophen; Sulfonamide derivatives; and Tramadol hcl  Home Medications   Current Outpatient Rx  Name Route Sig Dispense Refill  . ACETAMINOPHEN-CODEINE #3 300-30 MG PO TABS Oral Take 1 tablet by mouth 3 (three) times daily.    Marland Kitchen ALPRAZOLAM 2 MG PO TABS Oral Take 2 mg by mouth at bedtime as needed.      . ALPRAZOLAM 2 MG PO TABS Oral Take 2 mg by mouth QID.    Marland Kitchen ASPIRIN 325 MG PO TABS Oral Take 325 mg by mouth daily.      . ATORVASTATIN CALCIUM 20 MG PO TABS  Oral Take 20 mg by mouth daily.      . CYCLOBENZAPRINE HCL 10 MG PO TABS Oral Take 10 mg by mouth 3 (three) times daily as needed.    Marland Kitchen DIPHENHYDRAMINE HCL (SLEEP) 25 MG PO TABS Oral Take 25 mg by mouth at bedtime as needed.    Marland Kitchen GLIPIZIDE 10 MG PO TABS Oral Take 10 mg by mouth 2 (two) times daily before a meal.      . IBUPROFEN 800 MG PO TABS Oral Take 800 mg by mouth every 8 (eight) hours as needed.      . IPRATROPIUM BROMIDE 0.02 % IN SOLN Nebulization Take 500 mcg by nebulization 4 (four) times daily.    Marland Kitchen LANSOPRAZOLE 30 MG PO CPDR Oral Take 30 mg by mouth daily.      Marland Kitchen METFORMIN HCL 1000 MG PO TABS Oral Take 1,000 mg by mouth 2 (two) times daily with a meal.    . SITAGLIPTIN PHOSPHATE 100 MG PO TABS Oral Take 100 mg by mouth daily.      . TRAZODONE HCL 100 MG PO TABS Oral Take 100 mg by mouth at bedtime.      Marland Kitchen KETOROLAC TROMETHAMINE 10 MG PO TABS Oral Take 1 tablet (10 mg total) by mouth every 6 (six) hours as needed for pain. 20 tablet 0  . PROMETHAZINE HCL 25 MG PO TABS Oral Take 1 tablet (25 mg total) by mouth every 6 (six) hours as needed for nausea. 20 tablet 0    BP 104/66  Pulse 74  Temp 98 F (36.7 C) (Oral)  Resp 16  Ht 5\' 9"  (1.753 m)  Wt 166 lb (75.297 kg)  BMI 24.51 kg/m2  SpO2 96%  Physical Exam Nursing notes and Vital Signs reviewed. Appearance:  Patient appears stated age, and in no acute distress Eyes:  Pupils are equal, round, and reactive to light and accomodation.  Extraocular movement is intact.  Conjunctivae are not inflamed  Nose:   Normal Mouth:  moist mucous membranes  Pharynx:  Normal Neck:  Supple.  No adenopathy Lungs:  Clear to auscultation.  Breath sounds are equal.  Heart:  Regular rate and rhythm without murmurs, rubs, or gallops.  Abdomen:  Vague tenderness in the peri-umbilical and hypogastric areas without masses or hepatosplenomegaly.  No rebound tenderness.  Bowel sounds are present but decreased.  No CVA or flank tenderness.  Extremities:   No edema.  No calf tenderness Skin:  No rash present.   ED Course  Procedures  none   Labs Reviewed  POCT URINALYSIS DIP (MANUAL ENTRY):  normal  POCT CBC:  WBC 6.2; LY 29.2; MO 6.8; GR 64.0; Hgb 13.0; Platelets 184     1. Abdominal pain; note normal CBC and urinalysis.  Note previous urine culture two weeks ago showed insignificant growth.  2. Dysuria   3. Nausea & vomiting  MDM  Etiology of patient's symptoms not obvious today.  Her recurring dysuria may be secondary to a decrease in endogenous estrogen and she may benefit from estrogen vaginal cream.  The cause of her recurring abdominal pain is not clear; Irritable bowel?   GYN problem? (left ovary remains). At her request, administered Toradol 30mg  IM and Phenergan 25mg  IM.  Also given Rx for Toradol 10mg  and Phenergan 25mg  to use until she visits her PCP. Pending tests:  Urine culture, CMP, amylase, lipase. Pelvic exam deferred today because she is scheduled to followup with her PCP in 3 days. If symptoms become significantly worse during the night or over the weekend, proceed to the local emergency room.          Lattie Haw, MD 07/03/12 1300

## 2012-07-04 LAB — COMPLETE METABOLIC PANEL WITH GFR
ALT: 38 U/L — ABNORMAL HIGH (ref 0–35)
CO2: 25 mEq/L (ref 19–32)
Calcium: 9.2 mg/dL (ref 8.4–10.5)
Chloride: 107 mEq/L (ref 96–112)
GFR, Est African American: >89 mL/min
Sodium: 138 mEq/L (ref 135–145)
Total Protein: 6.7 g/dL (ref 6.0–8.3)

## 2012-07-06 ENCOUNTER — Telehealth: Payer: Self-pay | Admitting: *Deleted

## 2012-07-19 ENCOUNTER — Encounter: Payer: Self-pay | Admitting: *Deleted

## 2012-07-19 ENCOUNTER — Emergency Department (INDEPENDENT_AMBULATORY_CARE_PROVIDER_SITE_OTHER): Payer: Medicare Other

## 2012-07-19 ENCOUNTER — Emergency Department (INDEPENDENT_AMBULATORY_CARE_PROVIDER_SITE_OTHER)
Admission: EM | Admit: 2012-07-19 | Discharge: 2012-07-19 | Disposition: A | Discharge status: Home / Self Care | Payer: Medicare Other | Source: Home / Self Care | Attending: Family Medicine | Admitting: Family Medicine

## 2012-07-19 DIAGNOSIS — S63501A Unspecified sprain of right wrist, initial encounter: Secondary | ICD-10-CM

## 2012-07-19 DIAGNOSIS — W230XXA Caught, crushed, jammed, or pinched between moving objects, initial encounter: Secondary | ICD-10-CM

## 2012-07-19 DIAGNOSIS — M25539 Pain in unspecified wrist: Secondary | ICD-10-CM

## 2012-07-19 DIAGNOSIS — S63509A Unspecified sprain of unspecified wrist, initial encounter: Secondary | ICD-10-CM

## 2012-07-19 MED ORDER — ONDANSETRON HCL 4 MG PO TABS
4.0000 mg | ORAL_TABLET | Freq: Once | ORAL | Status: AC
  Administered 2012-07-19: 4 mg via ORAL

## 2012-07-19 MED ORDER — ONDANSETRON HCL 4 MG PO TABS: 4.0000 mg | ORAL_TABLET | Freq: Four times a day (QID) | ORAL | Status: AC

## 2012-07-19 MED ORDER — KETOROLAC TROMETHAMINE 10 MG PO TABS: 10.0000 mg | ORAL_TABLET | Freq: Four times a day (QID) | ORAL | Status: AC | PRN

## 2012-07-19 MED ORDER — KETOROLAC TROMETHAMINE 30 MG/ML IM SOLN
30.0000 mg | Freq: Once | INTRAMUSCULAR | Status: AC | PRN
  Administered 2012-07-19: 30 mg via INTRAMUSCULAR

## 2012-07-19 NOTE — ED Provider Notes (Signed)
History     CSN: [id number]  Arrival date & time 07/19/12  1927   First MD Initiated Contact with Patient 07/19/12 2002      Chief Complaint  Patient presents with  . Wrist Injury     HPI Comments: While attempting to retrieve a cat from underneath a chair, patient caught her right wrist on the framing underneath.  She complains of pain/swelling in her right wrist.  Patient is a 51 y.o. female presenting with wrist pain. The history is provided by the patient.  Wrist Pain This is a new problem. The current episode started 1 to 2 hours ago. The problem occurs constantly. The problem has been gradually worsening. Associated symptoms comments: none. Exacerbated by: flexing right wrist. Nothing relieves the symptoms. She has tried nothing for the symptoms.    Past Medical History  Diagnosis Date  . Hypertension   . Bipolar disorder   . Diabetes mellitus type II   . Anxiety   . Hyperlipemia   . GERD (gastroesophageal reflux disease)   . TIA (transient ischemic attack)     Past Surgical History  Procedure Date  . Abdominal hysterectomy   . Appendectomy     Family History  Problem Relation Age of Onset  . Alcohol abuse Father   . Hypertension Father   . Diabetes Father   . Aneurysm Father   . COPD Father   . COPD Mother   . Colon cancer Mother     History  Substance Use Topics  . Smoking status: Never Smoker   . Smokeless tobacco: Never Used  . Alcohol Use: Yes     socially    OB History    Grav Para Term Preterm Abortions TAB SAB Ect Mult Living                  Review of Systems  All other systems reviewed and are negative.    Allergies  Ciprofloxacin; Darvon; Hydrocodone-acetaminophen; Morphine sulfate; Oxycodone-acetaminophen; Propoxyphene hcl; Propoxyphene-acetaminophen; Sulfonamide derivatives; and Tramadol hcl  Home Medications   Current Outpatient Rx  Name Route Sig Dispense Refill  . ACETAMINOPHEN-CODEINE #3 300-30 MG PO TABS Oral Take 1  tablet by mouth 3 (three) times daily.    Marland Kitchen ALPRAZOLAM 2 MG PO TABS Oral Take 2 mg by mouth at bedtime as needed.      . ALPRAZOLAM 2 MG PO TABS Oral Take 2 mg by mouth QID.    Marland Kitchen ASPIRIN 325 MG PO TABS Oral Take 325 mg by mouth daily.      . ATORVASTATIN CALCIUM 20 MG PO TABS Oral Take 20 mg by mouth daily.      . CYCLOBENZAPRINE HCL 10 MG PO TABS Oral Take 10 mg by mouth 3 (three) times daily as needed.    Marland Kitchen DIPHENHYDRAMINE HCL (SLEEP) 25 MG PO TABS Oral Take 25 mg by mouth at bedtime as needed.    Marland Kitchen GLIPIZIDE 10 MG PO TABS Oral Take 10 mg by mouth 2 (two) times daily before a meal.      . IBUPROFEN 800 MG PO TABS Oral Take 800 mg by mouth every 8 (eight) hours as needed.      . IPRATROPIUM BROMIDE 0.02 % IN SOLN Nebulization Take 500 mcg by nebulization 4 (four) times daily.    Marland Kitchen KETOROLAC TROMETHAMINE 10 MG PO TABS Oral Take 1 tablet (10 mg total) by mouth every 6 (six) hours as needed for pain. 20 tablet 0  . LANSOPRAZOLE 30 MG  PO CPDR Oral Take 30 mg by mouth daily.      Marland Kitchen METFORMIN HCL 1000 MG PO TABS Oral Take 1,000 mg by mouth 2 (two) times daily with a meal.    . ONDANSETRON HCL 4 MG PO TABS Oral Take 1 tablet (4 mg total) by mouth every 6 (six) hours. as needed for nausea 12 tablet 0  . PROMETHAZINE HCL 25 MG PO TABS Oral Take 1 tablet (25 mg total) by mouth every 6 (six) hours as needed for nausea. 20 tablet 0  . SITAGLIPTIN PHOSPHATE 100 MG PO TABS Oral Take 100 mg by mouth daily.      . TRAZODONE HCL 100 MG PO TABS Oral Take 100 mg by mouth at bedtime.        BP 103/68  Pulse 79  Temp 97.7 F (36.5 C) (Oral)  Resp 18  Ht 5\' 10"  (1.778 m)  Wt 163 lb 12 oz (74.277 kg)  BMI 23.50 kg/m2  SpO2 98%  Physical Exam  Nursing note and vitals reviewed. Constitutional: She is oriented to person, place, and time. She appears well-developed and well-nourished. No distress.  HENT:  Head: Atraumatic.  Eyes: Conjunctivae are normal. Pupils are equal, round, and reactive to light.    Cardiovascular: Normal heart sounds.   Pulmonary/Chest: Breath sounds normal.  Musculoskeletal: She exhibits tenderness.       Right wrist: She exhibits decreased range of motion, tenderness, bony tenderness and swelling. She exhibits no effusion, no crepitus, no deformity and no laceration.       Arms:      Tenderness over right wrist with mild swelling. Distal Neurovascular function is intact.   Neurological: She is alert and oriented to person, place, and time.  Skin: Skin is warm and dry.    ED Course  Procedures  none   Dg Wrist Complete Right  07/19/2012  *RADIOLOGY REPORT*  Clinical Data: Wrist injury today.  RIGHT WRIST - COMPLETE 3+ VIEW  Comparison: 09/23/2010 radiographs.  Findings: The mineralization and alignment are normal.  There is no evidence of acute fracture or dislocation.  No focal soft tissue swelling is evident.  IMPRESSION: Stable examination.  No acute osseous findings   Original Report Authenticated By: Gerrianne Scale, M.D.      1. Right wrist sprain       MDM  Ace wrap and wrist splint applied.  Dispensed sling. Toradol 30mg  IM.  Zofran 4mg  po. Rx for Toradol 10mg , and Zofran 4mg  po Apply ice pack for 30 to 45 minutes every 1 to 4 hours.  Continue until swelling decreases.  Wear ace wrap until swelling decreases.  Wear wrist splint for about one week.  Wear sling for 3 to 5 days.  Begin wrist exercises in about 5 to 7 days [employer] information and instruction handout given)  [facility]up with [facility] if not improving about two weeks.          Lattie Haw, MD 07/21/12 [phone]

## 2012-07-19 NOTE — ED Notes (Signed)
Pt c/o RT wrist injury x 1 hour ago. No OTC meds.

## 2012-11-13 ENCOUNTER — Emergency Department (INDEPENDENT_AMBULATORY_CARE_PROVIDER_SITE_OTHER)
Admission: EM | Admit: 2012-11-13 | Discharge: 2012-11-13 | Disposition: A | Discharge status: Home / Self Care | Payer: Medicare Other | Source: Home / Self Care | Attending: Family Medicine | Admitting: Family Medicine

## 2012-11-13 ENCOUNTER — Encounter: Payer: Self-pay | Admitting: *Deleted

## 2012-11-13 DIAGNOSIS — R112 Nausea with vomiting, unspecified: Secondary | ICD-10-CM

## 2012-11-13 DIAGNOSIS — J111 Influenza due to unidentified influenza virus with other respiratory manifestations: Secondary | ICD-10-CM

## 2012-11-13 MED ORDER — KETOROLAC TROMETHAMINE 10 MG PO TABS: 10.0000 mg | ORAL_TABLET | Freq: Four times a day (QID) | ORAL | Status: DC | PRN

## 2012-11-13 MED ORDER — GUAIFENESIN-CODEINE 100-10 MG/5ML PO SYRP: 10.0000 mL | ORAL_SOLUTION | Freq: Every day | ORAL | Status: DC

## 2012-11-13 MED ORDER — KETOROLAC TROMETHAMINE 30 MG/ML IJ SOLN
30.0000 mg | Freq: Once | INTRAMUSCULAR | Status: AC
  Administered 2012-11-13: 30 mg via INTRAMUSCULAR

## 2012-11-13 MED ORDER — PROMETHAZINE HCL 25 MG/ML IJ SOLN
25.0000 mg | Freq: Once | INTRAMUSCULAR | Status: AC
  Administered 2012-11-13: 25 mg via INTRAMUSCULAR

## 2012-11-13 MED ORDER — OSELTAMIVIR PHOSPHATE 75 MG PO CAPS: 75.0000 mg | ORAL_CAPSULE | Freq: Two times a day (BID) | ORAL | Status: DC

## 2012-11-13 MED ORDER — PROMETHAZINE HCL 25 MG PO TABS: 25.0000 mg | ORAL_TABLET | Freq: Four times a day (QID) | ORAL | Status: DC | PRN

## 2012-11-13 NOTE — ED Notes (Signed)
Patient c/o sore throat, headache, cough, chills, body ache, ear pain, nasal congestion x 2 days. Has tried Benadryl, Omnaris, Ibuprofen with no help.

## 2012-11-13 NOTE — ED Provider Notes (Signed)
History     CSN: [id number]  Arrival date & time 11/13/12  1133   First MD Initiated Contact with Patient 11/13/12 1238      Chief Complaint  Patient presents with  . Chills  . Fever  . Cough  . Nasal Congestion     HPI Comments: Patient complains of two day history of flu-like symptoms with sore throat, headache, cough, chills, body aches, ear pain, nausea with occasional vomiting, and nasal congestion.  She has tried Benadryl, Omnaris, and Ibuprofen without improvement.  The history is provided by the patient.    Past Medical History  Diagnosis Date  . Hypertension   . Bipolar disorder   . Diabetes mellitus type II   . Anxiety   . Hyperlipemia   . GERD (gastroesophageal reflux disease)   . TIA (transient ischemic attack)     Past Surgical History  Procedure Date  . Abdominal hysterectomy   . Appendectomy     Family History  Problem Relation Age of Onset  . Alcohol abuse Father   . Hypertension Father   . Diabetes Father   . Aneurysm Father   . COPD Father   . COPD Mother   . Colon cancer Mother     History  Substance Use Topics  . Smoking status: Never Smoker   . Smokeless tobacco: Never Used  . Alcohol Use: Yes     Comment: socially    OB History    Grav Para Term Preterm Abortions TAB SAB Ect Mult Living                  Review of Systems + sore throat + cough No pleuritic pain + wheezing + nasal congestion + post-nasal drainage ? sinus pain/pressure No itchy/red eyes ? earache No hemoptysis + SOB + fever, + chills + nausea + vomiting No abdominal pain No diarrhea No urinary symptoms No skin rashes + fatigue + myalgias + headache   Allergies  Ciprofloxacin; Darvon; Hydrocodone-acetaminophen; Morphine sulfate; Oxycodone-acetaminophen; Propoxyphene hcl; Propoxyphene-acetaminophen; Sulfonamide derivatives; and Tramadol hcl  Home Medications   Current Outpatient Rx  Name  Route  Sig  Dispense  Refill  .  ACETAMINOPHEN-CODEINE #3 300-30 MG PO TABS   Oral   Take 1 tablet by mouth 3 (three) times daily.         Marland Kitchen ALPRAZOLAM 2 MG PO TABS   Oral   Take 2 mg by mouth at bedtime as needed.           . ALPRAZOLAM 2 MG PO TABS   Oral   Take 2 mg by mouth QID.         Marland Kitchen ASPIRIN 325 MG PO TABS   Oral   Take 325 mg by mouth daily.           . ATORVASTATIN CALCIUM 20 MG PO TABS   Oral   Take 20 mg by mouth daily.           . CYCLOBENZAPRINE HCL 10 MG PO TABS   Oral   Take 10 mg by mouth 3 (three) times daily as needed.         Marland Kitchen DIPHENHYDRAMINE HCL (SLEEP) 25 MG PO TABS   Oral   Take 25 mg by mouth at bedtime as needed.         Marland Kitchen GLIPIZIDE 10 MG PO TABS   Oral   Take 10 mg by mouth 2 (two) times daily before a meal.           .  GUAIFENESIN-CODEINE 100-10 MG/5ML PO SYRP   Oral   Take 10 mLs by mouth at bedtime. for cough   120 mL   0   . IBUPROFEN 800 MG PO TABS   Oral   Take 800 mg by mouth every 8 (eight) hours as needed.           . IPRATROPIUM BROMIDE 0.02 % IN SOLN   Nebulization   Take 500 mcg by nebulization 4 (four) times daily.         Marland Kitchen KETOROLAC TROMETHAMINE 10 MG PO TABS   Oral   Take 1 tablet (10 mg total) by mouth every 6 (six) hours as needed for pain.   12 tablet   0   . LANSOPRAZOLE 30 MG PO CPDR   Oral   Take 30 mg by mouth daily.           Marland Kitchen METFORMIN HCL 1000 MG PO TABS   Oral   Take 1,000 mg by mouth 2 (two) times daily with a meal.         . OSELTAMIVIR PHOSPHATE 75 MG PO CAPS   Oral   Take 1 capsule (75 mg total) by mouth every 12 (twelve) hours.   10 capsule   0   . PROMETHAZINE HCL 25 MG PO TABS   Oral   Take 1 tablet (25 mg total) by mouth every 6 (six) hours as needed for nausea.   20 tablet   0   . PROMETHAZINE HCL 25 MG PO TABS   Oral   Take 1 tablet (25 mg total) by mouth every 6 (six) hours as needed for nausea.   10 tablet   0   . SITAGLIPTIN PHOSPHATE 100 MG PO TABS   Oral   Take 100 mg by mouth  daily.           . TRAZODONE HCL 100 MG PO TABS   Oral   Take 100 mg by mouth at bedtime.             BP 96/67  Pulse 87  Temp 98.1 F (36.7 C) (Oral)  Resp 18  Ht 5\' 10"  (1.778 m)  Wt 159 lb 8 oz (72.349 kg)  BMI 22.89 kg/m2  SpO2 98%  Physical Exam Nursing notes and Vital Signs reviewed. Appearance:  Patient appears stated age, and in no acute distress Eyes:  Pupils are equal, round, and reactive to light and accomodation.  Extraocular movement is intact.  Conjunctivae are not inflamed  Ears:  Canals normal.  Tympanic membranes normal.  Nose:  Mildly congested turbinates.  No sinus tenderness.   Pharynx:  Normal Neck:  Supple.  Slightly tender shotty posterior nodes are palpated bilaterally  Lungs:  Clear to auscultation.  Breath sounds are equal. Chest:  Distinct tenderness to palpation over the mid-sternum.   Heart:  Regular rate and rhythm without murmurs, rubs, or gallops.  Abdomen:  Nontender without masses or hepatosplenomegaly.  Bowel sounds are present.  No CVA or flank tenderness.  Extremities:  No edema.  No calf tenderness Skin:  No rash present.   ED Course  Procedures  none      1. Nausea & vomiting   2. Influenza-like illness       MDM  Given injection Toradol and Phenergan. Rx for Toradol, and Phenergan for nausea. Tussi-Organidin for cough at night. Begin Tamiflu Take plain Mucinex (guaifenesin) twice daily for cough and congestion.  Increase fluid intake, rest. May use Afrin nasal  spray (or generic oxymetazoline) twice daily for about 5 days.  Also recommend using saline nasal spray several times daily and saline nasal irrigation (AYR is a common brand) Stop all antihistamines for now, and other non-prescription cough/cold preparations. Follow-up with family doctor if not improving 5 days.        Lattie Haw, MD 11/16/12 [phone]

## 2013-03-02 ENCOUNTER — Emergency Department (INDEPENDENT_AMBULATORY_CARE_PROVIDER_SITE_OTHER)
Admission: EM | Admit: 2013-03-02 | Discharge: 2013-03-02 | Disposition: A | Discharge status: Home / Self Care | Payer: Self-pay | Source: Home / Self Care | Attending: Family Medicine | Admitting: Family Medicine

## 2013-03-02 ENCOUNTER — Encounter: Payer: Self-pay | Admitting: Emergency Medicine

## 2013-03-02 DIAGNOSIS — N3000 Acute cystitis without hematuria: Secondary | ICD-10-CM

## 2013-03-02 DIAGNOSIS — R110 Nausea: Secondary | ICD-10-CM

## 2013-03-02 DIAGNOSIS — R300 Dysuria: Secondary | ICD-10-CM

## 2013-03-02 LAB — POCT URINALYSIS DIP (MANUAL ENTRY)
Bilirubin, UA: NEGATIVE
Glucose, UA: NEGATIVE
Ketones, POC UA: NEGATIVE
Spec Grav, UA: 1.02 (ref 1.005–1.03)

## 2013-03-02 MED ORDER — AMOXICILLIN-POT CLAVULANATE 875-125 MG PO TABS: 1.0000 | ORAL_TABLET | Freq: Two times a day (BID) | ORAL | Status: DC

## 2013-03-02 MED ORDER — PHENAZOPYRIDINE HCL 200 MG PO TABS: 200.0000 mg | ORAL_TABLET | Freq: Three times a day (TID) | ORAL | Status: DC

## 2013-03-02 MED ORDER — PROMETHAZINE HCL 25 MG PO TABS: 25.0000 mg | ORAL_TABLET | Freq: Four times a day (QID) | ORAL | Status: DC | PRN

## 2013-03-02 MED ORDER — PROMETHAZINE HCL 25 MG/ML IJ SOLN
25.0000 mg | Freq: Once | INTRAMUSCULAR | Status: AC
  Administered 2013-03-02: 25 mg via INTRAMUSCULAR

## 2013-03-02 NOTE — ED Notes (Signed)
Dysuria, vomiting, chills since 2 am

## 2013-03-02 NOTE — ED Provider Notes (Signed)
History     CSN: [id number]  Arrival date & time 03/02/13  1304   First MD Initiated Contact with Patient 03/02/13 1328      Chief Complaint  Patient presents with  . Dysuria       HPI Comments: Patient complains of onset of dysuria and frequency about 2 to 3 days ago.  Today she awoke with nausea/vomiting, although she is able to take fluids.  She had chills last night.    Patient is a 52 y.o. female presenting with dysuria. The history is provided by the patient.  Dysuria  This is a new problem. The current episode started more than 2 days ago. The problem occurs every urination. The problem has been gradually worsening. The quality of the pain is described as burning. The pain is mild. There has been no fever. Associated symptoms include chills, nausea, vomiting, frequency, hesitancy, urgency and flank pain. Pertinent negatives include no sweats, no discharge and no hematuria. She has tried nothing for the symptoms.    Past Medical History  Diagnosis Date  . Hypertension   . Bipolar disorder   . Diabetes mellitus type II   . Anxiety   . Hyperlipemia   . GERD (gastroesophageal reflux disease)   . TIA (transient ischemic attack)     Past Surgical History  Procedure Laterality Date  . Abdominal hysterectomy    . Appendectomy      Family History  Problem Relation Age of Onset  . Alcohol abuse Father   . Hypertension Father   . Diabetes Father   . Aneurysm Father   . COPD Father   . COPD Mother   . Colon cancer Mother     History  Substance Use Topics  . Smoking status: Never Smoker   . Smokeless tobacco: Never Used  . Alcohol Use: Yes     Comment: socially    OB History   Grav Para Term Preterm Abortions TAB SAB Ect Mult Living                  Review of Systems  Constitutional: Positive for chills.  Gastrointestinal: Positive for nausea and vomiting.  Genitourinary: Positive for dysuria, hesitancy, urgency, frequency and flank pain. Negative for  hematuria.  All other systems reviewed and are negative.    Allergies  Ciprofloxacin; Darvon; Hydrocodone-acetaminophen; Morphine sulfate; Oxycodone-acetaminophen; Propoxyphene hcl; Propoxyphene-acetaminophen; Sulfonamide derivatives; and Tramadol hcl  Home Medications   Current Outpatient Rx  Name  Route  Sig  Dispense  Refill  . acetaminophen-codeine (TYLENOL #3) 300-30 MG per tablet   Oral   Take 1 tablet by mouth 3 (three) times daily.         Marland Kitchen alprazolam (XANAX) 2 MG tablet   Oral   Take 2 mg by mouth at bedtime as needed.           Marland Kitchen alprazolam (XANAX) 2 MG tablet   Oral   Take 2 mg by mouth QID.         Marland Kitchen amoxicillin-clavulanate (AUGMENTIN) 875-125 MG per tablet   Oral   Take 1 tablet by mouth 2 (two) times daily. Take with food   14 tablet   0   . aspirin 325 MG tablet   Oral   Take 325 mg by mouth daily.           Marland Kitchen atorvastatin (LIPITOR) 20 MG tablet   Oral   Take 20 mg by mouth daily.           Marland Kitchen  cyclobenzaprine (FLEXERIL) 10 MG tablet   Oral   Take 10 mg by mouth 3 (three) times daily as needed.         . diphenhydrAMINE (SOMINEX) 25 MG tablet   Oral   Take 25 mg by mouth at bedtime as needed.         Marland Kitchen glipiZIDE (GLUCOTROL) 10 MG tablet   Oral   Take 10 mg by mouth 2 (two) times daily before a meal.           . guaiFENesin-codeine (ROBITUSSIN AC) 100-10 MG/5ML syrup   Oral   Take 10 mLs by mouth at bedtime. for cough   120 mL   0   . ibuprofen (ADVIL,MOTRIN) 800 MG tablet   Oral   Take 800 mg by mouth every 8 (eight) hours as needed.           Marland Kitchen ipratropium (ATROVENT) 0.02 % nebulizer solution   Nebulization   Take 500 mcg by nebulization 4 (four) times daily.         Marland Kitchen ketorolac (TORADOL) 10 MG tablet   Oral   Take 1 tablet (10 mg total) by mouth every 6 (six) hours as needed for pain.   12 tablet   0   . lansoprazole (PREVACID) 30 MG capsule   Oral   Take 30 mg by mouth daily.           . metFORMIN  (GLUCOPHAGE) 1000 MG tablet   Oral   Take 1,000 mg by mouth 2 (two) times daily with a meal.         . oseltamivir (TAMIFLU) 75 MG capsule   Oral   Take 1 capsule (75 mg total) by mouth every 12 (twelve) hours.   10 capsule   0   . phenazopyridine (PYRIDIUM) 200 MG tablet   Oral   Take 1 tablet (200 mg total) by mouth 3 (three) times daily. Take with food.   6 tablet   0   . EXPIRED: promethazine (PHENERGAN) 25 MG tablet   Oral   Take 1 tablet (25 mg total) by mouth every 6 (six) hours as needed for nausea.   20 tablet   0   . promethazine (PHENERGAN) 25 MG tablet   Oral   Take 1 tablet (25 mg total) by mouth every 6 (six) hours as needed for nausea.   10 tablet   0   . sitaGLIPtin (JANUVIA) 100 MG tablet   Oral   Take 100 mg by mouth daily.           . traZODone (DESYREL) 100 MG tablet   Oral   Take 100 mg by mouth at bedtime.             BP 96/64  Pulse 81  Temp(Src) 98 F (36.7 C) (Oral)  Ht 5\' 10"  (1.778 m)  Wt 148 lb (67.132 kg)  BMI 21.24 kg/m2  SpO2 98%  Physical Exam Nursing notes and Vital Signs reviewed. Appearance:  Patient appears stated age, and in no acute distress Eyes:  Pupils are equal, round, and reactive to light and accomodation.  Extraocular movement is intact.  Conjunctivae are not inflamed  Pharynx:  Normal;  moist mucous membranes  Neck:  Supple.  No adenopathy Lungs:  Clear to auscultation.  Breath sounds are equal.  Heart:  Regular rate and rhythm without murmurs, rubs, or gallops.  Abdomen:   Mild suprapubic tenderness without masses or hepatosplenomegaly.  Bowel sounds are present.  No CVA or flank tenderness.  Extremities:  No edema.  No calf tenderness Skin:  No rash present.   ED Course  Procedures  none  Labs Reviewed  URINE CULTURE pending  POCT URINALYSIS DIP (MANUAL ENTRY) BLO trace intact; LEU trace      1. Dysuria   2. Acute cystitis   3. Nausea alone       MDM  Phenergan 25mg  IM.  Rx for oral  Phenergan. At patient's request, will begin Augmentin; urine culture pending.  Rx given for Pyridium Urine culture pending.  Increase fluid intake. Followup with PCP as scheduled.        Lattie Haw, MD 03/03/13 [phone]

## 2013-03-04 LAB — URINE CULTURE: Colony Count: 80000

## 2013-03-05 ENCOUNTER — Telehealth: Payer: Self-pay | Admitting: *Deleted

## 2013-07-20 ENCOUNTER — Emergency Department (INDEPENDENT_AMBULATORY_CARE_PROVIDER_SITE_OTHER)
Admission: EM | Admit: 2013-07-20 | Discharge: 2013-07-20 | Disposition: A | Discharge status: Home / Self Care | Payer: Medicare Other | Source: Home / Self Care | Attending: Family Medicine | Admitting: Family Medicine

## 2013-07-20 ENCOUNTER — Encounter: Payer: Self-pay | Admitting: *Deleted

## 2013-07-20 DIAGNOSIS — R51 Headache: Secondary | ICD-10-CM

## 2013-07-20 DIAGNOSIS — J069 Acute upper respiratory infection, unspecified: Secondary | ICD-10-CM

## 2013-07-20 DIAGNOSIS — K120 Recurrent oral aphthae: Secondary | ICD-10-CM

## 2013-07-20 DIAGNOSIS — R110 Nausea: Secondary | ICD-10-CM

## 2013-07-20 MED ORDER — PHENYLEPHRINE-CHLORPHEN-DM 1.75-0.75-2.75 MG/ML PO LIQD: ORAL | Status: DC

## 2013-07-20 MED ORDER — AMOXICILLIN-POT CLAVULANATE ER 1000-62.5 MG PO TB12: 1.0000 | ORAL_TABLET | Freq: Two times a day (BID) | ORAL | Status: DC

## 2013-07-20 MED ORDER — KETOROLAC TROMETHAMINE 10 MG PO TABS: 10.0000 mg | ORAL_TABLET | Freq: Four times a day (QID) | ORAL | Status: DC | PRN

## 2013-07-20 MED ORDER — TRIAMCINOLONE ACETONIDE 0.1 % MT PSTE: PASTE | OROMUCOSAL | Status: DC

## 2013-07-20 MED ORDER — PROMETHAZINE HCL 25 MG PO TABS: 25.0000 mg | ORAL_TABLET | Freq: Four times a day (QID) | ORAL | Status: DC | PRN

## 2013-07-20 MED ORDER — KETOROLAC TROMETHAMINE 30 MG/ML IJ SOLN
30.0000 mg | Freq: Once | INTRAMUSCULAR | Status: AC
  Administered 2013-07-20: 30 mg via INTRAMUSCULAR

## 2013-07-20 MED ORDER — PROMETHAZINE-DM 6.25-15 MG/5ML PO SYRP: ORAL_SOLUTION | ORAL | Status: DC

## 2013-07-20 MED ORDER — PROMETHAZINE HCL 25 MG/ML IJ SOLN
25.0000 mg | Freq: Once | INTRAMUSCULAR | Status: AC
  Administered 2013-07-20: 25 mg via INTRAMUSCULAR

## 2013-07-20 NOTE — ED Notes (Signed)
C/o cough, body aches, chills, congestion, sneezing and HA x 3 days. Taken Benadryl and IBF.

## 2013-07-20 NOTE — ED Provider Notes (Signed)
CSN: [id number]     Arrival date & time 07/20/13  1440 History   First MD Initiated Contact with Patient 07/20/13 1507     Chief Complaint  Patient presents with  . Cough  . Generalized Body Aches      HPI Comments: Patient complains of onset 3 days ago of nausea/vomiting and loose stools (now resolved).  This was followed by sneezing, cough, sore throat, headache, myalgias, earache, and chills/sweats.  She also complains of sores on her gums.   The history is provided by the patient.    Past Medical History  Diagnosis Date  . Hypertension   . Bipolar disorder   . Diabetes mellitus type II   . Anxiety   . Hyperlipemia   . GERD (gastroesophageal reflux disease)   . TIA (transient ischemic attack)    Past Surgical History  Procedure Laterality Date  . Abdominal hysterectomy    . Appendectomy     Family History  Problem Relation Age of Onset  . Alcohol abuse Father   . Hypertension Father   . Diabetes Father   . Aneurysm Father   . COPD Father   . COPD Mother   . Colon cancer Mother    History  Substance Use Topics  . Smoking status: Never Smoker   . Smokeless tobacco: Never Used  . Alcohol Use: Yes     Comment: socially   OB History   Grav Para Term Preterm Abortions TAB SAB Ect Mult Living                 Review of Systems + sore throat + cough No pleuritic pain No wheezing + nasal congestion + post-nasal drainage ? sinus pain/pressure No itchy/red eyes ? earache No hemoptysis + SOB + fever, + chills + nausea + vomiting No abdominal pain No diarrhea No urinary symptoms No skin rashes + fatigue + myalgias + headache Used OTC meds without relief  Allergies  Ciprofloxacin; Darvon; Hydrocodone-acetaminophen; Morphine sulfate; Oxycodone-acetaminophen; Propoxyphene hcl; Propoxyphene-acetaminophen; Sulfonamide derivatives; and Tramadol hcl  Home Medications   Current Outpatient Rx  Name  Route  Sig  Dispense  Refill  . acetaminophen-codeine  (TYLENOL #3) 300-30 MG per tablet   Oral   Take 1 tablet by mouth 3 (three) times daily.         Marland Kitchen alprazolam (XANAX) 2 MG tablet   Oral   Take 2 mg by mouth QID.         Marland Kitchen amoxicillin-clavulanate (AUGMENTIN XR) 1000-62.5 MG per tablet   Oral   Take 1 tablet by mouth 2 (two) times daily. (Rx void after 07/28/13)   14 tablet   0   . aspirin 325 MG tablet   Oral   Take 325 mg by mouth daily.           Marland Kitchen atorvastatin (LIPITOR) 20 MG tablet   Oral   Take 20 mg by mouth daily.           . cyclobenzaprine (FLEXERIL) 10 MG tablet   Oral   Take 10 mg by mouth 3 (three) times daily as needed.         Marland Kitchen glipiZIDE (GLUCOTROL) 10 MG tablet   Oral   Take 10 mg by mouth 2 (two) times daily before a meal.           . ipratropium (ATROVENT) 0.02 % nebulizer solution   Nebulization   Take 500 mcg by nebulization 4 (four) times daily.         Marland Kitchen  ketorolac (TORADOL) 10 MG tablet   Oral   Take 1 tablet (10 mg total) by mouth every 6 (six) hours as needed for pain.   20 tablet   0   . lansoprazole (PREVACID) 30 MG capsule   Oral   Take 30 mg by mouth daily.           . metFORMIN (GLUCOPHAGE) 1000 MG tablet   Oral   Take 1,000 mg by mouth 2 (two) times daily with a meal.         . phenazopyridine (PYRIDIUM) 200 MG tablet   Oral   Take 1 tablet (200 mg total) by mouth 3 (three) times daily. Take with food.   6 tablet   0   . Phenylephrine-Chlorphen-DM 1.75-0.75-2.75 MG/ML LIQD      Take 5 to 10mL by mouth at bedtime as needed for cough and congestion   60 mL   0   . promethazine (PHENERGAN) 25 MG tablet   Oral   Take 1 tablet (25 mg total) by mouth every 6 (six) hours as needed for nausea.   20 tablet   0   . sitaGLIPtin (JANUVIA) 100 MG tablet   Oral   Take 100 mg by mouth daily.           . traZODone (DESYREL) 100 MG tablet   Oral   Take 100 mg by mouth at bedtime.            BP 106/68  Pulse 87  Temp(Src) 98.3 F (36.8 C) (Oral)  Resp 16   Wt 157 lb (71.215 kg)  BMI 22.53 kg/m2  SpO2 99% Physical Exam Nursing notes and Vital Signs reviewed. Appearance:  Patient appears stated age, and in no acute distress Eyes:  Pupils are equal, round, and reactive to light and accomodation.  Extraocular movement is intact.  Conjunctivae are not inflamed.  Fundi benign  Ears:  Canals normal.  Tympanic membranes normal.  Nose:  Mildly congested turbinates.  No sinus tenderness.   Pharynx:  Normal Neck:  Supple.  Tender shotty posterior nodes are palpated bilaterally  Lungs:  Clear to auscultation.  Breath sounds are equal.  Heart:  Regular rate and rhythm without murmurs, rubs, or gallops.  Abdomen:  Nontender without masses or hepatosplenomegaly.  Bowel sounds are present.  No CVA or flank tenderness.  Extremities:  No edema.  No calf tenderness Skin:  No rash present.  Neurologic:  Cranial nerves 2 through 12 are normal. Gait and station are normal.      ED Course  Procedures  none        MDM   1. Headache(784.0)   2. Acute upper respiratory infections of unspecified site; suspect early viral URI   3. Nausea alone    For headache and nausea, administered Toradol 30mg  and Phenergan 25mg  IM There is no evidence of bacterial infection today.   Treat symptomatically for now  May continue oral Toradol for pain.  Phenergan for nausea. Increase fluid intake, rest. May use Afrin nasal spray (or generic oxymetazoline) twice daily for about 5 days.  Also recommend using saline nasal spray several times daily and saline nasal irrigation (AYR is a common brand) Stop all antihistamines for now, and other non-prescription cough/cold preparations. Begin Augmentin if not improving about one week or if persistent fever develops (Given a prescription to hold, with an expiration date)  Follow-up with family doctor if not improving about10 days.     Lattie Haw, MD  07/20/13 1610 

## 2013-09-03 ENCOUNTER — Encounter: Payer: Self-pay | Admitting: Emergency Medicine

## 2013-09-03 ENCOUNTER — Emergency Department (INDEPENDENT_AMBULATORY_CARE_PROVIDER_SITE_OTHER)
Admission: EM | Admit: 2013-09-03 | Discharge: 2013-09-03 | Disposition: A | Discharge status: Home / Self Care | Payer: Medicare Other | Source: Home / Self Care | Attending: Family Medicine | Admitting: Family Medicine

## 2013-09-03 DIAGNOSIS — R300 Dysuria: Secondary | ICD-10-CM

## 2013-09-03 DIAGNOSIS — J069 Acute upper respiratory infection, unspecified: Secondary | ICD-10-CM

## 2013-09-03 LAB — POCT URINALYSIS DIP (MANUAL ENTRY)
Glucose, UA: NEGATIVE
Protein Ur, POC: NEGATIVE
Spec Grav, UA: 1.02 (ref 1.005–1.03)
Urobilinogen, UA: 0.2 (ref 0–1)

## 2013-09-03 MED ORDER — PROMETHAZINE-DM 6.25-15 MG/5ML PO SYRP: ORAL_SOLUTION | ORAL | Status: DC

## 2013-09-03 MED ORDER — AMOXICILLIN 875 MG PO TABS: 875.0000 mg | ORAL_TABLET | Freq: Two times a day (BID) | ORAL | Status: DC

## 2013-09-03 MED ORDER — KETOROLAC TROMETHAMINE 30 MG/ML IJ SOLN
30.0000 mg | Freq: Once | INTRAMUSCULAR | Status: AC
  Administered 2013-09-03: 30 mg via INTRAMUSCULAR

## 2013-09-03 MED ORDER — KETOROLAC TROMETHAMINE 10 MG PO TABS: 10.0000 mg | ORAL_TABLET | Freq: Four times a day (QID) | ORAL | Status: DC | PRN

## 2013-09-03 NOTE — ED Provider Notes (Signed)
CSN: [id number]     Arrival date & time 09/03/13  1313 History   First MD Initiated Contact with Patient 09/03/13 1336     Chief Complaint  Patient presents with  . Dysuria  . Cough     HPI Comments: Patient complains of onset of sneezing, sore throat, cough, nasal congestion, myalgias, fatigue, and headache three days ago.  Two days ago she developed dysuria, and last night had a fever to 102.  The history is provided by the patient.    Past Medical History  Diagnosis Date  . Hypertension   . Bipolar disorder   . Diabetes mellitus type II   . Anxiety   . Hyperlipemia   . GERD (gastroesophageal reflux disease)   . TIA (transient ischemic attack)    Past Surgical History  Procedure Laterality Date  . Abdominal hysterectomy    . Appendectomy     Family History  Problem Relation Age of Onset  . Alcohol abuse Father   . Hypertension Father   . Diabetes Father   . Aneurysm Father   . COPD Father   . COPD Mother   . Colon cancer Mother    History  Substance Use Topics  . Smoking status: Never Smoker   . Smokeless tobacco: Never Used  . Alcohol Use: Yes     Comment: socially   OB History   Grav Para Term Preterm Abortions TAB SAB Ect Mult Living                 Review of Systems + sore throat + cough No pleuritic pain + wheezing + nasal congestion + post-nasal drainage + sinus pain/pressure No itchy/red eyes ? earache No hemoptysis No SOB No fever, + chills No nausea No vomiting No abdominal pain No diarrhea + dysuria No skin rashes + fatigue No myalgias + headache Used OTC meds without relief  Allergies  Ciprofloxacin; Darvon; Hydrocodone-acetaminophen; Morphine sulfate; Oxycodone-acetaminophen; Propoxyphene hcl; Propoxyphene-acetaminophen; Sulfonamide derivatives; and Tramadol hcl  Home Medications   Current Outpatient Rx  Name  Route  Sig  Dispense  Refill  . acetaminophen-codeine (TYLENOL #3) 300-30 MG per tablet   Oral   Take 1 tablet by  mouth 3 (three) times daily.         Marland Kitchen alprazolam (XANAX) 2 MG tablet   Oral   Take 2 mg by mouth QID.         Marland Kitchen amoxicillin (AMOXIL) 875 MG tablet   Oral   Take 1 tablet (875 mg total) by mouth 2 (two) times daily.   14 tablet   0   . aspirin 325 MG tablet   Oral   Take 325 mg by mouth daily.           Marland Kitchen atorvastatin (LIPITOR) 20 MG tablet   Oral   Take 20 mg by mouth daily.           . cyclobenzaprine (FLEXERIL) 10 MG tablet   Oral   Take 10 mg by mouth 3 (three) times daily as needed.         Marland Kitchen glipiZIDE (GLUCOTROL) 10 MG tablet   Oral   Take 10 mg by mouth 2 (two) times daily before a meal.           . ipratropium (ATROVENT) 0.02 % nebulizer solution   Nebulization   Take 500 mcg by nebulization 4 (four) times daily.         Marland Kitchen ketorolac (TORADOL) 10 MG tablet  Oral   Take 1 tablet (10 mg total) by mouth every 6 (six) hours as needed for pain.   20 tablet   0   . lansoprazole (PREVACID) 30 MG capsule   Oral   Take 30 mg by mouth daily.           . metFORMIN (GLUCOPHAGE) 1000 MG tablet   Oral   Take 1,000 mg by mouth 2 (two) times daily with a meal.         . phenazopyridine (PYRIDIUM) 200 MG tablet   Oral   Take 1 tablet (200 mg total) by mouth 3 (three) times daily. Take with food.   6 tablet   0   . EXPIRED: promethazine (PHENERGAN) 25 MG tablet   Oral   Take 1 tablet (25 mg total) by mouth every 6 (six) hours as needed for nausea.   20 tablet   0   . promethazine-dextromethorphan (PROMETHAZINE-DM) 6.25-15 MG/5ML syrup      Take 5mL by mouth at bedtime as needed for cough   120 mL   0   . sitaGLIPtin (JANUVIA) 100 MG tablet   Oral   Take 100 mg by mouth daily.           . traZODone (DESYREL) 100 MG tablet   Oral   Take 100 mg by mouth at bedtime.           . triamcinolone (KENALOG) 0.1 % paste      Place thin layer on mouth ulcers four times daily   5 g   0    BP 103/68  Pulse 88  Temp(Src) 97.7 F (36.5 C)  (Oral)  Ht 5\' 8"  (1.727 m)  Wt 159 lb (72.122 kg)  BMI 24.18 kg/m2  SpO2 97% Physical Exam Nursing notes and Vital Signs reviewed. Appearance:  Patient appears healthy, stated age, and in no acute distress Eyes:  Pupils are equal, round, and reactive to light and accomodation.  Extraocular movement is intact.  Conjunctivae are not inflamed  Ears:  Canals normal.  Tympanic membranes normal.  Nose:  Congested turbinates.  Maxillary sinus tenderness is present.  Pharynx:  Normal Neck:  Supple.  Tender shotty posterior nodes are palpated bilaterally  Lungs:  Clear to auscultation.  Breath sounds are equal.  Heart:  Regular rate and rhythm without murmurs, rubs, or gallops.  Abdomen:  Nontender without masses or hepatosplenomegaly.  Bowel sounds are present.  No CVA or flank tenderness.  Extremities:  No edema.  No calf tenderness Skin:  No rash present.   ED Course  Procedures  none    Labs Reviewed  URINE CULTURE  POCT URINALYSIS DIP (MANUAL ENTRY):  BLO Trace-intact; otherwise negative         MDM   1. Dysuria   2. Acute upper respiratory infections of unspecified site; suspect viral URI   Urine culture pending. Toradol 30mg  IM at patient's request.  Rx for Toradol 10mg  Q6hr prn. Begin amoxicillin.  Phenergan DM for cough at bedtime. Increase fluid intake, rest. Also recommend using saline nasal spray several times daily and saline nasal irrigation (AYR is a common brand) Stop all antihistamines for now, and other non-prescription cough/cold preparations. Follow-up with family doctor if not improving about one week.    Lattie Haw, MD 09/03/13 1515

## 2013-09-03 NOTE — ED Notes (Signed)
States cough and headache started on Wednesday and Thursday noted with painful urination.

## 2013-09-04 LAB — URINE CULTURE: Colony Count: 6000

## 2013-09-05 ENCOUNTER — Telehealth: Payer: Self-pay | Admitting: *Deleted

## 2014-01-10 IMAGING — CR DG WRIST COMPLETE 3+V*R*
2 series · 2 of 2 positions shown · non-contrast
Comparison: 09/23/2010 radiographs.

CLINICAL DATA: Wrist injury today.

RIGHT WRIST - COMPLETE 3+ VIEW

[view not recorded (1 of 2)]
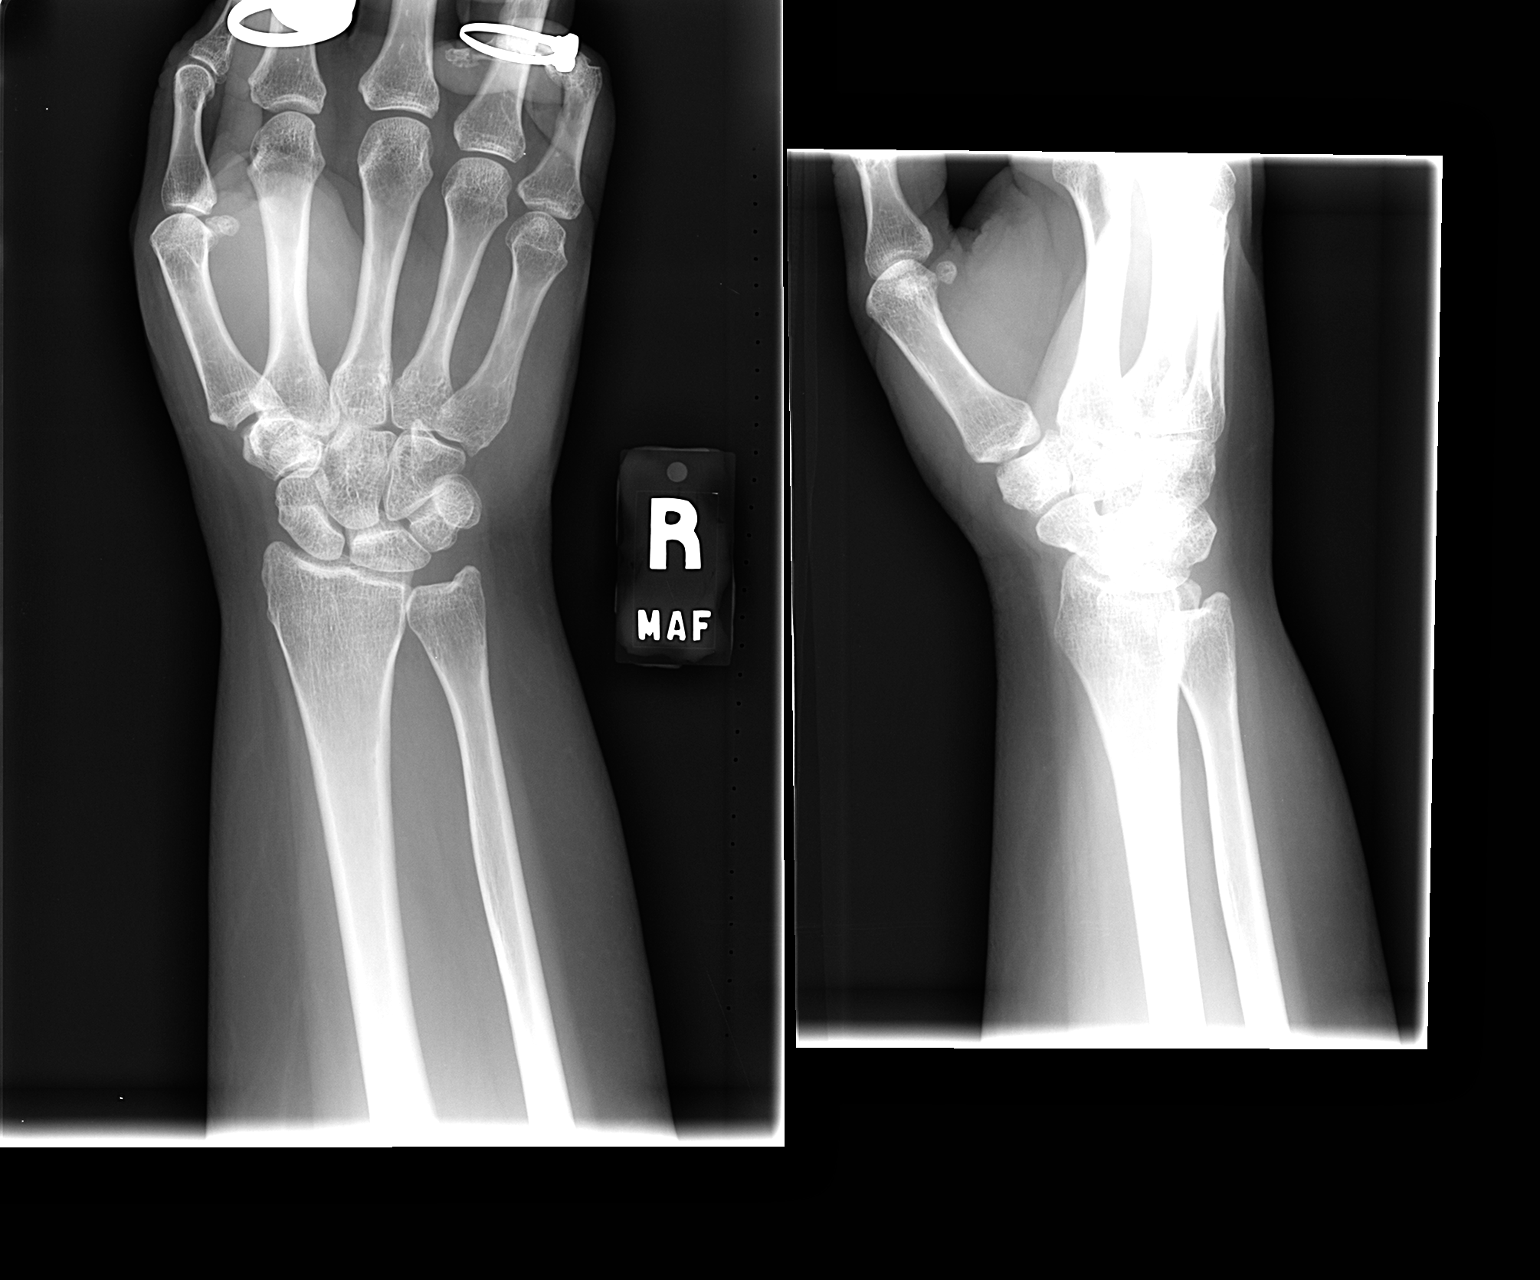

[view not recorded (2 of 2)]
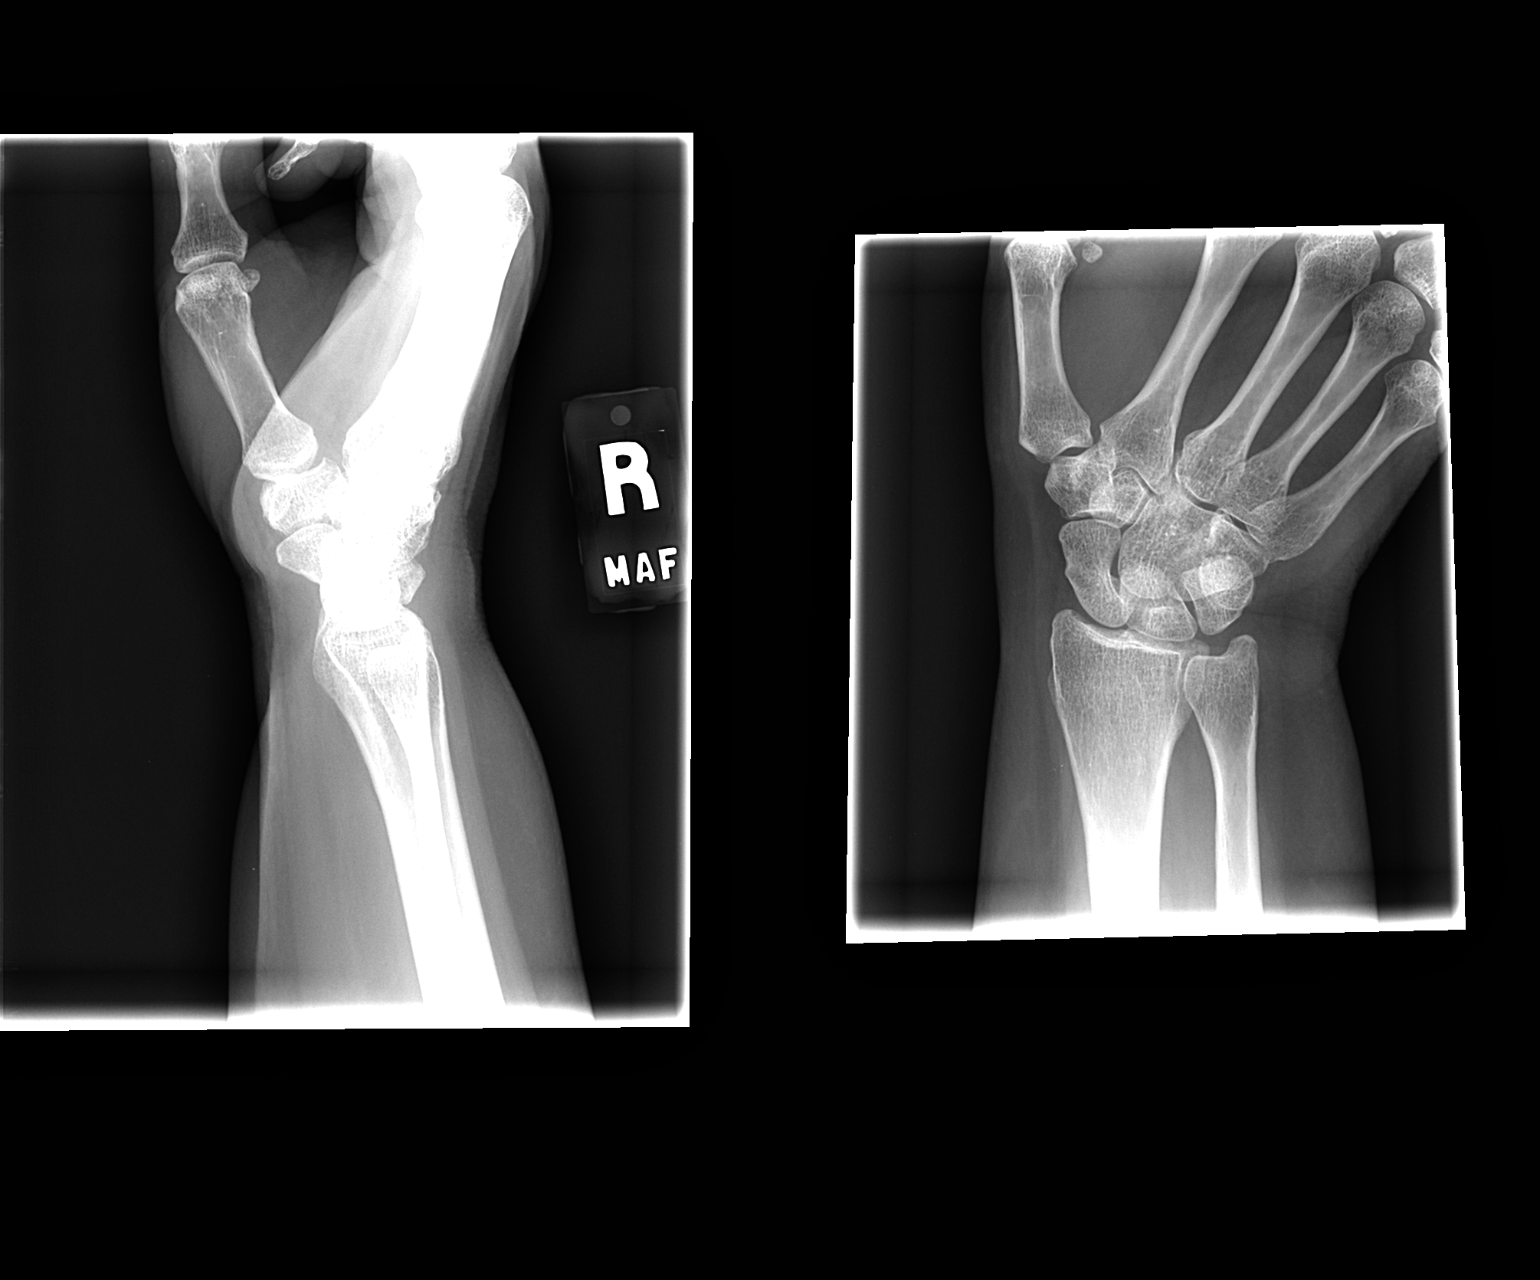

[2 of 2 positions shown; findings below may reference images not displayed]

FINDINGS: The mineralization and alignment are normal.  There is no
evidence of acute fracture or dislocation.  No focal soft tissue
swelling is evident.
IMPRESSION: Stable examination.  No acute osseous findings

## 2014-02-20 ENCOUNTER — Emergency Department (INDEPENDENT_AMBULATORY_CARE_PROVIDER_SITE_OTHER)
Admission: EM | Admit: 2014-02-20 | Discharge: 2014-02-20 | Disposition: A | Discharge status: Home / Self Care | Payer: Medicare Other | Source: Home / Self Care | Attending: Family Medicine | Admitting: Family Medicine

## 2014-02-20 ENCOUNTER — Encounter: Payer: Self-pay | Admitting: Emergency Medicine

## 2014-02-20 DIAGNOSIS — J069 Acute upper respiratory infection, unspecified: Secondary | ICD-10-CM

## 2014-02-20 DIAGNOSIS — R300 Dysuria: Secondary | ICD-10-CM

## 2014-02-20 LAB — POCT URINALYSIS DIP (MANUAL ENTRY)
BILIRUBIN UA: NEGATIVE
GLUCOSE UA: NEGATIVE
Ketones, POC UA: NEGATIVE
Nitrite, UA: NEGATIVE
PH UA: 5.5 (ref 5–8)
Protein Ur, POC: NEGATIVE
UROBILINOGEN UA: 1 (ref 0–1)

## 2014-02-20 MED ORDER — PROMETHAZINE-DM 6.25-15 MG/5ML PO SYRP: ORAL_SOLUTION | ORAL | Status: DC

## 2014-02-20 MED ORDER — AMOXICILLIN-POT CLAVULANATE 875-125 MG PO TABS: 1.0000 | ORAL_TABLET | Freq: Two times a day (BID) | ORAL | Status: DC

## 2014-02-20 MED ORDER — PROMETHAZINE HCL 25 MG PO TABS: 25.0000 mg | ORAL_TABLET | Freq: Four times a day (QID) | ORAL | Status: DC | PRN

## 2014-02-20 MED ORDER — PROMETHAZINE HCL 25 MG/ML IJ SOLN
25.0000 mg | Freq: Once | INTRAMUSCULAR | Status: AC
  Administered 2014-02-20: 25 mg via INTRAMUSCULAR

## 2014-02-20 MED ORDER — KETOROLAC TROMETHAMINE 30 MG/ML IJ SOLN
30.0000 mg | Freq: Once | INTRAMUSCULAR | Status: AC
  Administered 2014-02-20: 30 mg via INTRAMUSCULAR

## 2014-02-20 MED ORDER — PHENAZOPYRIDINE HCL 200 MG PO TABS: 200.0000 mg | ORAL_TABLET | Freq: Three times a day (TID) | ORAL | Status: DC

## 2014-02-20 NOTE — Discharge Instructions (Signed)
Take plain Mucinex (1200 mg guaifenesin) twice daily for cough and congestion.  May add Sudafed for sinus congestion.   Increase fluid intake, rest. °May use Afrin nasal spray (or generic oxymetazoline) twice daily for about 5 days.  Also recommend using saline nasal spray several times daily and saline nasal irrigation (AYR is a common brand) °Try warm salt water gargles for sore throat.  °Stop all antihistamines for now, and other non-prescription cough/cold preparations. °Follow-up with family doctor if not improving 7 to 10 days.  °

## 2014-02-20 NOTE — ED Notes (Signed)
Dysuria started yesterday morning

## 2014-02-20 NOTE — ED Provider Notes (Signed)
CSN: [id number]     Arrival date & time 02/20/14  0941 History   First MD Initiated Contact with Patient 02/20/14 1014     Chief Complaint  Patient presents with  . Dysuria      HPI Comments: Two days ago patient developed fatigue, myalgias, back ache, chills, and nausea.  She vomited once.  Yesterday she began sneezing, coughing, and developed sinus congestion.  She also developed dysuria.  She has had chills but no fever.  She continues to have nausea, and complains of persistent myalgias.  The history is provided by the patient.    Past Medical History  Diagnosis Date  . Hypertension   . Bipolar disorder   . Diabetes mellitus type II   . Anxiety   . Hyperlipemia   . GERD (gastroesophageal reflux disease)   . TIA (transient ischemic attack)    Past Surgical History  Procedure Laterality Date  . Abdominal hysterectomy    . Appendectomy     Family History  Problem Relation Age of Onset  . Alcohol abuse Father   . Hypertension Father   . Diabetes Father   . Aneurysm Father   . COPD Father   . COPD Mother   . Colon cancer Mother    History  Substance Use Topics  . Smoking status: Never Smoker   . Smokeless tobacco: Never Used  . Alcohol Use: Yes     Comment: socially   OB History   Grav Para Term Preterm Abortions TAB SAB Ect Mult Living                 Review of Systems + sore throat + cough No pleuritic pain No wheezing + nasal congestion + post-nasal drainage + sneezing ? sinus pain/pressure No itchy/red eyes No earache No hemoptysis No SOB No fever, + chills + nausea No vomiting No abdominal pain No diarrhea + urinary symptoms No skin rash + fatigue + myalgias + headache Used OTC meds without relief  Allergies  Ciprofloxacin; Darvon; Hydrocodone-acetaminophen; Morphine sulfate; Oxycodone-acetaminophen; Propoxyphene hcl; Propoxyphene n-acetaminophen; Sulfonamide derivatives; and Tramadol hcl  Home Medications   Current Outpatient Rx  Name   Route  Sig  Dispense  Refill  . acetaminophen-codeine (TYLENOL #3) 300-30 MG per tablet   Oral   Take 1 tablet by mouth 3 (three) times daily.         Marland Kitchen alprazolam (XANAX) 2 MG tablet   Oral   Take 2 mg by mouth QID.         Marland Kitchen amoxicillin (AMOXIL) 875 MG tablet   Oral   Take 1 tablet (875 mg total) by mouth 2 (two) times daily.   14 tablet   0   . aspirin 325 MG tablet   Oral   Take 325 mg by mouth daily.           Marland Kitchen atorvastatin (LIPITOR) 20 MG tablet   Oral   Take 20 mg by mouth daily.           . cyclobenzaprine (FLEXERIL) 10 MG tablet   Oral   Take 10 mg by mouth 3 (three) times daily as needed.         Marland Kitchen glipiZIDE (GLUCOTROL) 10 MG tablet   Oral   Take 10 mg by mouth 2 (two) times daily before a meal.           . ipratropium (ATROVENT) 0.02 % nebulizer solution   Nebulization   Take 500 mcg by nebulization  4 (four) times daily.         Marland Kitchen ketorolac (TORADOL) 10 MG tablet   Oral   Take 1 tablet (10 mg total) by mouth every 6 (six) hours as needed for pain.   20 tablet   0   . lansoprazole (PREVACID) 30 MG capsule   Oral   Take 30 mg by mouth daily.           . metFORMIN (GLUCOPHAGE) 1000 MG tablet   Oral   Take 1,000 mg by mouth 2 (two) times daily with a meal.         . phenazopyridine (PYRIDIUM) 200 MG tablet   Oral   Take 1 tablet (200 mg total) by mouth 3 (three) times daily. Take with food.   6 tablet   0   . EXPIRED: promethazine (PHENERGAN) 25 MG tablet   Oral   Take 1 tablet (25 mg total) by mouth every 6 (six) hours as needed for nausea.   20 tablet   0   . promethazine-dextromethorphan (PROMETHAZINE-DM) 6.25-15 MG/5ML syrup      Take 5mL by mouth at bedtime as needed for cough   120 mL   0   . sitaGLIPtin (JANUVIA) 100 MG tablet   Oral   Take 100 mg by mouth daily.           . traZODone (DESYREL) 100 MG tablet   Oral   Take 100 mg by mouth at bedtime.           . triamcinolone (KENALOG) 0.1 % paste       Place thin layer on mouth ulcers four times daily   5 g   0    BP 101/69  Pulse 84  Temp(Src) 97.9 F (36.6 C) (Oral)  Ht 5\' 10"  (1.778 m)  Wt 155 lb (70.308 kg)  BMI 22.24 kg/m2  SpO2 97% Physical Exam Nursing notes and Vital Signs reviewed. Appearance:  Patient appears stated age, and in no acute distress Eyes:  Pupils are equal, round, and reactive to light and accomodation.  Extraocular movement is intact.  Conjunctivae are not inflamed  Ears:  Canals normal.  Tympanic membranes normal.  Nose:  Congested turbinates.  No sinus tenderness.  Pharynx:  Normal Neck:  Supple.  Tender enlarged posterior nodes are palpated bilaterally  Lungs:  Clear to auscultation.  Breath sounds are equal.  Chest:  Distinct tenderness to palpation over the mid-sternum.  Heart:  Regular rate and rhythm without murmurs, rubs, or gallops.  Abdomen:  Nontender without masses or hepatosplenomegaly.  Bowel sounds are present.  No CVA or flank tenderness.  Extremities:  No edema.  No calf tenderness Skin:  No rash present.   ED Course  Procedures  none    Labs Reviewed  URINE CULTURE  POCT URINALYSIS DIP (MANUAL ENTRY):  SG >= 1.030; BLO small; LEU small; otherwise negative        MDM   1. Dysuria;  R/O UTI   2. Acute upper respiratory infections of unspecified site; suspect viral URI    Urine culture pending. At patient's request, Toradol 30mg  IM and Phenergan 25mg  IM Begin Augmentin.  Phenergan DM at bedtime for cough.  Refill Phenergan for nausea.  Take plain Mucinex (1200 mg guaifenesin) twice daily for cough and congestion.  May add Sudafed for sinus congestion.   Increase fluid intake, rest. May use Afrin nasal spray (or generic oxymetazoline) twice daily for about 5 days.  Also recommend using saline  nasal spray several times daily and saline nasal irrigation (AYR is a common brand) Try warm salt water gargles for sore throat.  Stop all antihistamines for now, and other non-prescription  cough/cold preparations.   Follow-up with family doctor if not improving 7 to 10 days.     Kandra Nicolas, MD 02/20/14 [phone]

## 2014-02-21 LAB — URINE CULTURE

## 2014-02-22 ENCOUNTER — Telehealth: Payer: Self-pay | Admitting: Emergency Medicine

## 2014-03-15 ENCOUNTER — Other Ambulatory Visit: Payer: Self-pay | Admitting: Family Medicine

## 2014-08-10 ENCOUNTER — Emergency Department (INDEPENDENT_AMBULATORY_CARE_PROVIDER_SITE_OTHER)
Admission: EM | Admit: 2014-08-10 | Discharge: 2014-08-10 | Disposition: A | Discharge status: Home / Self Care | Payer: Medicare Other | Source: Home / Self Care | Attending: Emergency Medicine | Admitting: Emergency Medicine

## 2014-08-10 ENCOUNTER — Encounter: Payer: Self-pay | Admitting: Emergency Medicine

## 2014-08-10 DIAGNOSIS — J209 Acute bronchitis, unspecified: Secondary | ICD-10-CM

## 2014-08-10 LAB — POCT INFLUENZA A/B
Influenza A, POC: NEGATIVE
Influenza B, POC: NEGATIVE

## 2014-08-10 MED ORDER — PROMETHAZINE-DM 6.25-15 MG/5ML PO SYRP: ORAL_SOLUTION | ORAL | Status: DC

## 2014-08-10 MED ORDER — AMOXICILLIN-POT CLAVULANATE 875-125 MG PO TABS: 1.0000 | ORAL_TABLET | Freq: Two times a day (BID) | ORAL | Status: DC

## 2014-08-10 MED ORDER — DIPHENHYDRAMINE HCL 50 MG/ML IJ SOLN
25.0000 mg | Freq: Once | INTRAMUSCULAR | Status: AC
  Administered 2014-08-10: 25 mg via INTRAMUSCULAR

## 2014-08-10 NOTE — ED Provider Notes (Signed)
CSN: [id number]     Arrival date & time 08/10/14  1429 History   First MD Initiated Contact with Patient 08/10/14 1501     Chief Complaint  Patient presents with  . Influenza   (Consider location/radiation/quality/duration/timing/severity/associated sxs/prior Treatment) Patient is a 53 y.o. female presenting with cough. The history is provided by the patient. No language interpreter was used.  Cough Cough characteristics:  Productive Sputum characteristics:  Yellow Severity:  Moderate Onset quality:  Gradual Duration:  3 days Timing:  Constant Progression:  Worsening Chronicity:  New Smoker: no   Relieved by:  Nothing Worsened by:  Nothing tried Ineffective treatments:  None tried Associated symptoms: headaches, myalgias and shortness of breath   Pt reports she gets bronchitis frequently.  Pt reports Dr. Assunta Found normally gives her cough syrup and Augmentin and this clears up her symptoms.  Past Medical History  Diagnosis Date  . Hypertension   . Bipolar disorder   . Diabetes mellitus type II   . Anxiety   . Hyperlipemia   . GERD (gastroesophageal reflux disease)   . TIA (transient ischemic attack)    Past Surgical History  Procedure Laterality Date  . Abdominal hysterectomy    . Appendectomy     Family History  Problem Relation Age of Onset  . Alcohol abuse Father   . Hypertension Father   . Diabetes Father   . Aneurysm Father   . COPD Father   . COPD Mother   . Colon cancer Mother    History  Substance Use Topics  . Smoking status: Never Smoker   . Smokeless tobacco: Never Used  . Alcohol Use: Yes     Comment: socially   OB History   Grav Para Term Preterm Abortions TAB SAB Ect Mult Living                 Review of Systems  Unable to perform ROS Respiratory: Positive for cough and shortness of breath.   Musculoskeletal: Positive for myalgias.  Neurological: Positive for headaches.  All other systems reviewed and are negative.   Allergies   Ciprofloxacin; Darvon; Hydrocodone-acetaminophen; Morphine sulfate; Oxycodone-acetaminophen; Propoxyphene hcl; Propoxyphene n-acetaminophen; Sulfonamide derivatives; and Tramadol hcl  Home Medications   Prior to Admission medications   Medication Sig Start Date End Date Taking? Authorizing Provider  acetaminophen-codeine (TYLENOL #3) 300-30 MG per tablet Take 1 tablet by mouth 3 (three) times daily.    Historical Provider, MD  alprazolam Duanne Moron) 2 MG tablet Take 2 mg by mouth QID.    Historical Provider, MD  amoxicillin-clavulanate (AUGMENTIN) 875-125 MG per tablet Take 1 tablet by mouth 2 (two) times daily. Take with food 08/10/14   Fransico Meadow, PA-C  aspirin 325 MG tablet Take 325 mg by mouth daily.      Historical Provider, MD  atorvastatin (LIPITOR) 20 MG tablet Take 20 mg by mouth daily.      Historical Provider, MD  cyclobenzaprine (FLEXERIL) 10 MG tablet Take 10 mg by mouth 3 (three) times daily as needed.    Historical Provider, MD  glipiZIDE (GLUCOTROL) 10 MG tablet Take 10 mg by mouth 2 (two) times daily before a meal.      Historical Provider, MD  ipratropium (ATROVENT) 0.02 % nebulizer solution Take 500 mcg by nebulization 4 (four) times daily.    Historical Provider, MD  ketorolac (TORADOL) 10 MG tablet Take 1 tablet (10 mg total) by mouth every 6 (six) hours as needed for pain. 09/03/13   Ishmael Holter  Beese, MD  lansoprazole (PREVACID) 30 MG capsule Take 30 mg by mouth daily.      Historical Provider, MD  metFORMIN (GLUCOPHAGE) 1000 MG tablet Take 1,000 mg by mouth 2 (two) times daily with a meal.    Historical Provider, MD  phenazopyridine (PYRIDIUM) 200 MG tablet Take 1 tablet (200 mg total) by mouth 3 (three) times daily. Take with food. 02/20/14   Kandra Nicolas, MD  promethazine (PHENERGAN) 25 MG tablet Take 1 tablet (25 mg total) by mouth every 6 (six) hours as needed for nausea. 02/20/14 02/27/14  Kandra Nicolas, MD  promethazine-dextromethorphan (PROMETHAZINE-DM) 6.25-15 MG/5ML  syrup Take 54mL by mouth at bedtime as needed for cough 08/10/14   Fransico Meadow, PA-C  sitaGLIPtin (JANUVIA) 100 MG tablet Take 100 mg by mouth daily.      Historical Provider, MD  traZODone (DESYREL) 100 MG tablet Take 100 mg by mouth at bedtime.      Historical Provider, MD  triamcinolone (KENALOG) 0.1 % paste Place thin layer on mouth ulcers four times daily 07/20/13   Kandra Nicolas, MD   BP 96/62  Pulse 77  Temp(Src) 98.1 F (36.7 C) (Oral)  Ht 5\' 10"  (1.778 m)  Wt 152 lb (68.947 kg)  BMI 21.81 kg/m2  SpO2 97% Physical Exam  Nursing note and vitals reviewed. Constitutional: She is oriented to person, place, and time. She appears well-developed and well-nourished.  HENT:  Head: Normocephalic.  Right Ear: External ear normal.  Left Ear: External ear normal.  Nose: Nose normal.  Mouth/Throat: Oropharynx is clear and moist.  Eyes: Pupils are equal, round, and reactive to light.  Neck: Normal range of motion.  Cardiovascular: Normal rate and normal heart sounds.   Pulmonary/Chest: Effort normal and breath sounds normal.  Abdominal: Soft.  Musculoskeletal: Normal range of motion.  Neurological: She is alert and oriented to person, place, and time.  Skin: Skin is warm.  Psychiatric: She has a normal mood and affect.    ED Course  Procedures (including critical care time) Labs Review Labs Reviewed - No data to display  Imaging Review No results found.   MDM   1. Acute bronchitis, unspecified organism    augmentin Phenergan syrup for cough Benadryl 25 mg IM   Pt request.    Fransico Meadow, PA-C 08/10/14 1520

## 2014-08-10 NOTE — ED Notes (Signed)
Cough, sneezing, body aches, low-grade fever, slight nausea and diarrhea x 3 days

## 2014-08-10 NOTE — Discharge Instructions (Signed)

## 2014-08-11 NOTE — ED Provider Notes (Signed)
Medical history/examination/treatment/procedure(s) were performed by non-physician provider and as supervising physician I was immediately available for consultation/collaboration.   Jacqulyn Cane, MD 08/11/14 1110

## 2014-10-31 ENCOUNTER — Encounter: Payer: Self-pay | Admitting: *Deleted

## 2014-10-31 ENCOUNTER — Emergency Department (INDEPENDENT_AMBULATORY_CARE_PROVIDER_SITE_OTHER)
Admission: EM | Admit: 2014-10-31 | Discharge: 2014-10-31 | Disposition: A | Discharge status: Home / Self Care | Payer: Medicare Other | Source: Home / Self Care | Attending: Emergency Medicine | Admitting: Emergency Medicine

## 2014-10-31 DIAGNOSIS — L723 Sebaceous cyst: Secondary | ICD-10-CM

## 2014-10-31 DIAGNOSIS — D171 Benign lipomatous neoplasm of skin and subcutaneous tissue of trunk: Secondary | ICD-10-CM

## 2014-10-31 DIAGNOSIS — L089 Local infection of the skin and subcutaneous tissue, unspecified: Secondary | ICD-10-CM

## 2014-10-31 MED ORDER — KETOROLAC TROMETHAMINE 60 MG/2ML IM SOLN
60.0000 mg | Freq: Once | INTRAMUSCULAR | Status: AC
  Administered 2014-10-31: 60 mg via INTRAMUSCULAR

## 2014-10-31 MED ORDER — AMOXICILLIN-POT CLAVULANATE 875-125 MG PO TABS: 1.0000 | ORAL_TABLET | Freq: Two times a day (BID) | ORAL | Status: DC

## 2014-10-31 MED ORDER — CEFTRIAXONE SODIUM 250 MG IJ SOLR
250.0000 mg | Freq: Once | INTRAMUSCULAR | Status: AC
  Administered 2014-10-31: 250 mg via INTRAMUSCULAR

## 2014-10-31 NOTE — ED Notes (Signed)
Brittney Gonzalez reports feeling several lumps to her right lower pelvis today while in the shower. C/o constant soreness, worse with pressure and activity. H/o right oophorectomy.

## 2014-10-31 NOTE — ED Provider Notes (Signed)
CSN: [id number]     Arrival date & time 10/31/14  1337 History   First MD Initiated Contact with Patient 10/31/14 1345     Chief Complaint  Patient presents with  . Mass    right pelvis   (Consider location/radiation/quality/duration/timing/severity/associated sxs/prior Treatment) HPI Brittney Gonzalez reports feeling several lumps to her right lower abdominal wall today while in the shower.  C/o constant moderate soreness, worse with pressure and activity. No radiation. No pain when she is not pressing on it.  I questioned her carefully.  Associated symptoms: Currently, she denies nausea or vomiting or diarrhea or change in bowel habits or fever or chills. No dysuria or hematuria or urinary frequency. Denies vaginal discharge or bleeding. Past Medical History  Diagnosis Date  . Hypertension   . Bipolar disorder   . Diabetes mellitus type II   . Anxiety   . Hyperlipemia   . GERD (gastroesophageal reflux disease)   . TIA (transient ischemic attack)    Past Surgical History  Procedure Laterality Date  . Abdominal hysterectomy    . Appendectomy    . Right oophorectomy     Family History  Problem Relation Age of Onset  . Alcohol abuse Father   . Hypertension Father   . Diabetes Father   . Aneurysm Father   . COPD Father   . COPD Mother   . Colon cancer Mother    History  Substance Use Topics  . Smoking status: Never Smoker   . Smokeless tobacco: Never Used  . Alcohol Use: Yes     Comment: socially   OB History    No data available     Review of Systems  All other systems reviewed and are negative.   Allergies  Ciprofloxacin; Darvon; Hydrocodone-acetaminophen; Morphine sulfate; Oxycodone-acetaminophen; Propoxyphene hcl; Propoxyphene n-acetaminophen; Sulfonamide derivatives; and Tramadol hcl  Home Medications   Prior to Admission medications   Medication Sig Start Date End Date Taking? Authorizing Provider  acetaminophen-codeine (TYLENOL #3) 300-30 MG per tablet Take  1 tablet by mouth 3 (three) times daily.    Historical Provider, MD  alprazolam Duanne Moron) 2 MG tablet Take 2 mg by mouth QID.    Historical Provider, MD  amoxicillin-clavulanate (AUGMENTIN) 875-125 MG per tablet Take 1 tablet by mouth 2 (two) times daily. Take with food. 10/31/14   Jacqulyn Cane, MD  aspirin 325 MG tablet Take 325 mg by mouth daily.      Historical Provider, MD  atorvastatin (LIPITOR) 20 MG tablet Take 20 mg by mouth daily.      Historical Provider, MD  cyclobenzaprine (FLEXERIL) 10 MG tablet Take 10 mg by mouth 3 (three) times daily as needed.    Historical Provider, MD  glipiZIDE (GLUCOTROL) 10 MG tablet Take 10 mg by mouth 2 (two) times daily before a meal.      Historical Provider, MD  ipratropium (ATROVENT) 0.02 % nebulizer solution Take 500 mcg by nebulization 4 (four) times daily.    Historical Provider, MD  ketorolac (TORADOL) 10 MG tablet Take 1 tablet (10 mg total) by mouth every 6 (six) hours as needed for pain. 09/03/13   Kandra Nicolas, MD  lansoprazole (PREVACID) 30 MG capsule Take 30 mg by mouth daily.      Historical Provider, MD  metFORMIN (GLUCOPHAGE) 1000 MG tablet Take 1,000 mg by mouth 2 (two) times daily with a meal.    Historical Provider, MD  sitaGLIPtin (JANUVIA) 100 MG tablet Take 100 mg by mouth daily.  Historical Provider, MD  traZODone (DESYREL) 100 MG tablet Take 100 mg by mouth at bedtime.      Historical Provider, MD  triamcinolone (KENALOG) 0.1 % paste Place thin layer on mouth ulcers four times daily 07/20/13   Kandra Nicolas, MD   BP 117/68 mmHg  Pulse 88  Temp(Src) 98.2 F (36.8 C) (Oral)  Resp 16  Wt 155 lb 12.8 oz (70.67 kg)  SpO2 98% Physical Exam  Constitutional: She is oriented to person, place, and time. She appears well-developed and well-nourished. No distress.  HENT:  Head: Normocephalic and atraumatic.  Mouth/Throat: Oropharynx is clear and moist.  Eyes: Conjunctivae and EOM are normal. Pupils are equal, round, and reactive to  light. Right eye exhibits no discharge. Left eye exhibits no discharge. No scleral icterus.  Neck: Normal range of motion. No JVD present. No tracheal deviation present.  Cardiovascular: Normal rate and normal heart sounds.   Pulmonary/Chest: Effort normal and breath sounds normal. No respiratory distress.  Abdominal: Soft. Bowel sounds are normal. She exhibits no distension. There is no rebound and no guarding.  several tender 1 x 1 cm rubbery, mobile, inflamed subcutaneous sebaceous cysts right mid and right lower quadrant of abdomen wall. No fluctuance or drainage or skin abnormality overlying this. No rash. No red streaks or heat. There is no deep abdominal tenderness or mass. No hepatosplenomegaly felt. No inguinal adenopathy palpated  Musculoskeletal: Normal range of motion.  Lymphadenopathy:    She has no cervical adenopathy.  Neurological: She is alert and oriented to person, place, and time.  Skin: Skin is warm. She is not diaphoretic.  Psychiatric: She has a normal mood and affect.  Mildly anxious  Nursing note and vitals reviewed.   ED Course  Procedures (including critical care time) Labs Review Labs Reviewed - No data to display  Imaging Review No results found.   MDM   1. Sebaceous cyst   2. Lipoma of skin of abdomen   3. Infected sebaceous cyst of skin     Likely has low-grade infection of these abdominal wall lipomas, but no evidence of abscess or any intra-abdominal process. Treatment options discussed, as well as risks, benefits, alternatives. Patient voiced understanding and agreement with the following plans: Patient specifically requested shot of Toradol and Rocephin, and I'm agreeable to give Toradol 60 mg IM stat for pain, and Rocephin 250 mg IM stat. Reviewed oral antibiotic choices, and she states Augmentin has worked the best in the past without side effects. New Prescriptions   AMOXICILLIN-CLAVULANATE (AUGMENTIN) 875-125 MG PER TABLET    Take 1  tablet by mouth 2 (two) times daily. Take with food.   advised Tylenol or ibuprofen OTC when necessary pain. Follow-up with your primary care doctor in 5-7 days if not improving, or sooner if symptoms become worse. She asked if the surgeon could excise these, and I advised that may be a treatment if medical treatment is not successful, but I explained that on physical exam, these are consistent with benign sebaceous cysts or lipomas.--At her request, Gave her names and contact information of surgeons to see in consultation. Precautions discussed. Red flags discussed.--Emergency room if any red flag Questions invited and answered. Patient voiced understanding and agreement.    Jacqulyn Cane, MD 10/31/14 [phone]

## 2015-02-28 ENCOUNTER — Emergency Department (INDEPENDENT_AMBULATORY_CARE_PROVIDER_SITE_OTHER)
Admission: EM | Admit: 2015-02-28 | Discharge: 2015-02-28 | Disposition: A | Discharge status: Home / Self Care | Payer: Medicare Other | Source: Home / Self Care | Attending: Family Medicine | Admitting: Family Medicine

## 2015-02-28 ENCOUNTER — Encounter: Payer: Self-pay | Admitting: *Deleted

## 2015-02-28 DIAGNOSIS — R112 Nausea with vomiting, unspecified: Secondary | ICD-10-CM

## 2015-02-28 DIAGNOSIS — R197 Diarrhea, unspecified: Secondary | ICD-10-CM | POA: Diagnosis not present

## 2015-02-28 LAB — POCT CBC W AUTO DIFF (K'VILLE URGENT CARE)

## 2015-02-28 MED ORDER — PROMETHAZINE HCL 25 MG/ML IJ SOLN
25.0000 mg | Freq: Once | INTRAMUSCULAR | Status: AC
  Administered 2015-02-28: 25 mg via INTRAMUSCULAR

## 2015-02-28 MED ORDER — PROMETHAZINE HCL 25 MG PO TABS
25.0000 mg | ORAL_TABLET | Freq: Four times a day (QID) | ORAL | Status: DC | PRN
Start: 2015-02-28 — End: 2015-02-28

## 2015-02-28 MED ORDER — PROMETHAZINE HCL 25 MG PO TABS: 25.0000 mg | ORAL_TABLET | Freq: Four times a day (QID) | ORAL | Status: DC | PRN

## 2015-02-28 MED ORDER — PROMETHAZINE HCL 25 MG PO TABS
25.0000 mg | ORAL_TABLET | Freq: Four times a day (QID) | ORAL | Status: DC | PRN
Start: 2015-02-28 — End: 2015-04-23

## 2015-02-28 NOTE — Discharge Instructions (Signed)

## 2015-02-28 NOTE — ED Notes (Signed)
Patient presents with nausea,vomiting,and diarrhea with fever and chills since Monday evening advises poor intake.

## 2015-02-28 NOTE — ED Provider Notes (Signed)
CSN: [id number]     Arrival date & time 02/28/15  1509 History   First MD Initiated Contact with Patient 02/28/15 1543     Chief Complaint  Patient presents with  . Nausea  . Emesis  . Diarrhea     HPI Comments: Patient awoke 1.5 days ago with nausea/vomiting and watery diarrhea.  The diarrhea has decreased somewhat, but nausea/vomiting has persisted.  She has had chills/sweats and fever to 102, with myalgias, fatigue, and headache.  No abdominal pain.  No urinary symptoms.  Denies recent foreign travel, or drinking untreated water in a wilderness environment.  She denies recent antibiotic use.                                                                                                                                                                                                                           Patient is a 54 y.o. female presenting with vomiting. The history is provided by the patient.  Emesis Severity:  Moderate Duration:  2 days Timing:  Intermittent Quality:  Stomach contents Progression:  Unchanged Chronicity:  New Recent urination:  Normal Relieved by:  Nothing Worsened by:  Food smell Associated symptoms: chills, cough, diarrhea, fever, headaches, myalgias, sore throat and URI   Associated symptoms: no abdominal pain and no arthralgias   Fever:    Duration:  2 days   Timing:  Intermittent   Max temp PTA (F):  102 Risk factors: alcohol use   Risk factors: no diabetes, no prior abdominal surgery, no sick contacts, no suspect food intake and no travel to endemic areas     Past Medical History  Diagnosis Date  . Hypertension   . Bipolar disorder   . Diabetes mellitus type II   . Anxiety   . Hyperlipemia   . GERD (gastroesophageal reflux disease)   . TIA (transient ischemic attack)    Past Surgical History  Procedure Laterality Date  . Abdominal hysterectomy    . Appendectomy    . Right oophorectomy     Family History  Problem Relation Age of Onset  .  Alcohol abuse Father   . Hypertension Father   . Diabetes Father   . Aneurysm Father   . COPD Father   . COPD Mother   . Colon cancer Mother    History  Substance Use Topics  . Smoking status: Never Smoker   . Smokeless tobacco: Never Used  . Alcohol Use: Yes  Comment: socially   OB History    No data available     Review of Systems  Constitutional: Positive for chills.  HENT: Positive for sore throat.   Gastrointestinal: Positive for vomiting and diarrhea. Negative for abdominal pain.  Musculoskeletal: Positive for myalgias. Negative for arthralgias.  Neurological: Positive for headaches.    Allergies  Ciprofloxacin; Darvon; Hydrocodone-acetaminophen; Morphine sulfate; Oxycodone-acetaminophen; Propoxyphene hcl; Propoxyphene n-acetaminophen; Sulfonamide derivatives; and Tramadol hcl  Home Medications   Prior to Admission medications   Medication Sig Start Date End Date Taking? Authorizing Provider  acetaminophen-codeine (TYLENOL #3) 300-30 MG per tablet Take 1 tablet by mouth 3 (three) times daily.    Historical Provider, MD  alprazolam Duanne Moron) 2 MG tablet Take 2 mg by mouth QID.    Historical Provider, MD  aspirin 325 MG tablet Take 325 mg by mouth daily.      Historical Provider, MD  atorvastatin (LIPITOR) 20 MG tablet Take 20 mg by mouth daily.      Historical Provider, MD  cyclobenzaprine (FLEXERIL) 10 MG tablet Take 10 mg by mouth 3 (three) times daily as needed.    Historical Provider, MD  glipiZIDE (GLUCOTROL) 10 MG tablet Take 10 mg by mouth 2 (two) times daily before a meal.      Historical Provider, MD  ipratropium (ATROVENT) 0.02 % nebulizer solution Take 500 mcg by nebulization 4 (four) times daily.    Historical Provider, MD  ketorolac (TORADOL) 10 MG tablet Take 1 tablet (10 mg total) by mouth every 6 (six) hours as needed for pain. 09/03/13   Kandra Nicolas, MD  lansoprazole (PREVACID) 30 MG capsule Take 30 mg by mouth daily.      Historical Provider, MD   metFORMIN (GLUCOPHAGE) 1000 MG tablet Take 1,000 mg by mouth 2 (two) times daily with a meal.    Historical Provider, MD  promethazine (PHENERGAN) 25 MG tablet Take 1 tablet (25 mg total) by mouth every 6 (six) hours as needed for nausea or vomiting. 02/28/15   Kandra Nicolas, MD  sitaGLIPtin (JANUVIA) 100 MG tablet Take 100 mg by mouth daily.      Historical Provider, MD  traZODone (DESYREL) 100 MG tablet Take 100 mg by mouth at bedtime.      Historical Provider, MD  triamcinolone (KENALOG) 0.1 % paste Place thin layer on mouth ulcers four times daily 07/20/13   Kandra Nicolas, MD   BP 114/86 mmHg  Pulse 97  Temp(Src) 97.4 F (36.3 C) (Oral)  Resp 16  Ht 5\' 8"  (1.727 m)  Wt 154 lb (69.854 kg)  BMI 23.42 kg/m2  SpO2 97% Physical Exam Nursing notes and Vital Signs reviewed. Appearance:  Patient appears stated age, and in no acute distress Eyes:  Pupils are equal, round, and reactive to light and accomodation.  Extraocular movement is intact.  Conjunctivae are not inflamed  Ears:  Canals normal.  Tympanic membranes normal.  Nose:  Mildly congested turbinates.  No sinus tenderness.   Mouth/Pharynx:  Normal; moist mucous membranes  Neck:  Supple.  No adenopathy Lungs:  Clear to auscultation.  Breath sounds are equal.  Heart:  Regular rate and rhythm without murmurs, rubs, or gallops.  Abdomen:  Nontender without masses or hepatosplenomegaly.  Bowel sounds are present.  No CVA or flank tenderness.  Extremities:  No edema.  No calf tenderness Skin:  No rash present.   ED Course  Procedures  None    Labs Reviewed  POCT CBC W AUTO DIFF (  [place]):  WBC 5.8; LY 34.7; MO 12.6; GR 52.7; Hgb 15.6; Platelets 209         MDM   1. Nausea and vomiting, vomiting of unspecified type; suspect viral gastroenteritis   2. Diarrhea    Phenergan 25mg  IM administered.  Rx given for Phenergan 25mg  PO Q6hr prn Begin clear liquids (Pedialyte while having diarrhea) until improved, then  advance to a [employer] (Bananas, Rice, Applesauce, Toast).  Then gradually resume a regular diet when tolerated.  Avoid milk products until well.  To decrease diarrhea, mix one teaspoon Citrucel (methylcellulose) in 2 oz water and drink one to three times daily.  Do not drink extra fluids with this dose and do not drink fluids for one hour afterwards.  When stools become more formed, may take Imodium (loperamide) once or twice daily to decrease stool frequency.  If symptoms become significantly worse during the night or over the weekend, proceed to the local emergency room.  Followup with Family Doctor as scheduled.     Kandra Nicolas, MD 02/28/15 [phone]

## 2015-03-02 ENCOUNTER — Telehealth: Payer: Self-pay | Admitting: *Deleted

## 2015-04-17 ENCOUNTER — Encounter: Payer: Self-pay | Admitting: *Deleted

## 2015-04-17 ENCOUNTER — Emergency Department
Admission: EM | Admit: 2015-04-17 | Discharge: 2015-04-17 | Disposition: A | Discharge status: Home / Self Care | Payer: Medicare Other | Source: Home / Self Care | Attending: Emergency Medicine | Admitting: Emergency Medicine

## 2015-04-17 DIAGNOSIS — J208 Acute bronchitis due to other specified organisms: Secondary | ICD-10-CM

## 2015-04-17 MED ORDER — AZITHROMYCIN 250 MG PO TABS: ORAL_TABLET | ORAL | Status: DC

## 2015-04-17 NOTE — Discharge Instructions (Signed)

## 2015-04-17 NOTE — ED Notes (Signed)
Pt c/o cough, aches and vomiting x 2-3 days.

## 2015-04-17 NOTE — ED Provider Notes (Signed)
CSN: [id number]     Arrival date & time 04/17/15  1124 History   First MD Initiated Contact with Patient 04/17/15 1200     Chief Complaint  Patient presents with  . Emesis   (Consider location/radiation/quality/duration/timing/severity/associated sxs/prior Treatment) Patient is a 54 y.o. female presenting with vomiting. The history is provided by the patient. No language interpreter was used.  Emesis Severity:  Moderate Duration:  3 days Timing:  Constant Able to tolerate:  Liquids Progression:  Worsening Context: post-tussive   Relieved by:  Nothing Worsened by:  Nothing tried Ineffective treatments:  None tried Associated symptoms: URI   Risk factors: sick contacts   Pt reports she has a bad cough. Pt vomits after coughing.  Pt is tolerating fluids.    Past Medical History  Diagnosis Date  . Hypertension   . Bipolar disorder   . Diabetes mellitus type II   . Anxiety   . Hyperlipemia   . GERD (gastroesophageal reflux disease)   . TIA (transient ischemic attack)    Past Surgical History  Procedure Laterality Date  . Abdominal hysterectomy    . Appendectomy    . Right oophorectomy     Family History  Problem Relation Age of Onset  . Alcohol abuse Father   . Hypertension Father   . Diabetes Father   . Aneurysm Father   . COPD Father   . COPD Mother   . Colon cancer Mother    History  Substance Use Topics  . Smoking status: Never Smoker   . Smokeless tobacco: Never Used  . Alcohol Use: Yes     Comment: socially   OB History    No data available     Review of Systems  Respiratory: Positive for cough.   Gastrointestinal: Positive for vomiting.  All other systems reviewed and are negative.   Allergies  Ciprofloxacin; Darvon; Hydrocodone-acetaminophen; Morphine sulfate; Oxycodone-acetaminophen; Propoxyphene hcl; Propoxyphene n-acetaminophen; Sulfonamide derivatives; and Tramadol hcl  Home Medications   Prior to Admission medications   Medication Sig  Start Date End Date Taking? Authorizing Provider  alprazolam Duanne Moron) 2 MG tablet Take 2 mg by mouth QID.    Historical Provider, MD  aspirin 325 MG tablet Take 325 mg by mouth daily.      Historical Provider, MD  atorvastatin (LIPITOR) 20 MG tablet Take 20 mg by mouth daily.      Historical Provider, MD  azithromycin (ZITHROMAX Z-PAK) 250 MG tablet Two tablets first day then one a day, days 2-5 04/17/15   Fransico Meadow, PA-C  cyclobenzaprine (FLEXERIL) 10 MG tablet Take 10 mg by mouth 3 (three) times daily as needed.    Historical Provider, MD  glipiZIDE (GLUCOTROL) 10 MG tablet Take 10 mg by mouth 2 (two) times daily before a meal.      Historical Provider, MD  ipratropium (ATROVENT) 0.02 % nebulizer solution Take 500 mcg by nebulization 4 (four) times daily.    Historical Provider, MD  lansoprazole (PREVACID) 30 MG capsule Take 30 mg by mouth daily.      Historical Provider, MD  metFORMIN (GLUCOPHAGE) 1000 MG tablet Take 1,000 mg by mouth 2 (two) times daily with a meal.    Historical Provider, MD  promethazine (PHENERGAN) 25 MG tablet Take 1 tablet (25 mg total) by mouth every 6 (six) hours as needed for nausea or vomiting. 02/28/15   Kandra Nicolas, MD  sitaGLIPtin (JANUVIA) 100 MG tablet Take 100 mg by mouth daily.  Historical Provider, MD  traZODone (DESYREL) 100 MG tablet Take 100 mg by mouth at bedtime.      Historical Provider, MD  triamcinolone (KENALOG) 0.1 % paste Place thin layer on mouth ulcers four times daily 07/20/13   Kandra Nicolas, MD   BP 109/74 mmHg  Pulse 90  Resp 16  Wt 147 lb (66.679 kg)  SpO2 98% Physical Exam  Constitutional: She is oriented to person, place, and time. She appears well-developed and well-nourished.  No signs of dehydration  HENT:  Head: Normocephalic and atraumatic.  Right Ear: External ear normal.  Nose: Nose normal.  Mouth/Throat: Oropharynx is clear and moist.  Eyes: Conjunctivae and EOM are normal.  Neck: Normal range of motion.   Cardiovascular: Normal rate and normal heart sounds.   Pulmonary/Chest: Effort normal and breath sounds normal.  Abdominal: She exhibits no distension.  Musculoskeletal: Normal range of motion.  Neurological: She is alert and oriented to person, place, and time.  Skin: Skin is warm.  Psychiatric: She has a normal mood and affect.  Nursing note and vitals reviewed.   ED Course  Procedures (including critical care time) Labs Review Labs Reviewed - No data to display  Imaging Review No results found.   MDM   1. Acute bronchitis due to other specified organisms    zithromax See your Physicain for recheck in 2-3 days AVS    Fransico Meadow, PA-C 04/17/15 [place], [place] 04/18/15 1106

## 2015-04-23 ENCOUNTER — Encounter: Payer: Self-pay | Admitting: Emergency Medicine

## 2015-04-23 ENCOUNTER — Emergency Department (INDEPENDENT_AMBULATORY_CARE_PROVIDER_SITE_OTHER)
Admission: EM | Admit: 2015-04-23 | Discharge: 2015-04-23 | Disposition: A | Discharge status: Home / Self Care | Payer: Medicare Other | Source: Home / Self Care | Attending: Family Medicine | Admitting: Family Medicine

## 2015-04-23 DIAGNOSIS — R300 Dysuria: Secondary | ICD-10-CM | POA: Diagnosis not present

## 2015-04-23 DIAGNOSIS — R112 Nausea with vomiting, unspecified: Secondary | ICD-10-CM

## 2015-04-23 MED ORDER — KETOROLAC TROMETHAMINE 60 MG/2ML IM SOLN
60.0000 mg | Freq: Once | INTRAMUSCULAR | Status: AC
  Administered 2015-04-23: 60 mg via INTRAMUSCULAR

## 2015-04-23 MED ORDER — PROMETHAZINE HCL 25 MG/ML IJ SOLN
50.0000 mg | Freq: Once | INTRAMUSCULAR | Status: AC
  Administered 2015-04-23: 50 mg via INTRAMUSCULAR

## 2015-04-23 MED ORDER — PROMETHAZINE HCL 25 MG PO TABS
25.0000 mg | ORAL_TABLET | Freq: Four times a day (QID) | ORAL | Status: DC | PRN
Start: 2015-04-23 — End: 2015-05-21

## 2015-04-23 MED ORDER — AMOXICILLIN-POT CLAVULANATE 875-125 MG PO TABS: 1.0000 | ORAL_TABLET | Freq: Two times a day (BID) | ORAL | Status: DC

## 2015-04-23 NOTE — ED Provider Notes (Signed)
CSN: [id number]     Arrival date & time 04/23/15  1454 History   First MD Initiated Contact with Patient 04/23/15 1611     Chief Complaint  Patient presents with  . Dysuria      HPI Comments: Patient awoke yesterday with dysuria, urgency, and lower abdominal pain.  She has had nausea/vomiting and fever to 101 yesterday.  She has had nocturia.  Patient is a 54 y.o. female presenting with dysuria. The history is provided by the patient.  Dysuria Pain quality:  Stabbing and burning Pain severity:  Moderate Onset quality:  Sudden Duration:  2 days Timing:  Constant Progression:  Worsening Chronicity:  Recurrent Recent urinary tract infections: no   Relieved by:  Nothing Worsened by:  Nothing tried Ineffective treatments:  Phenazopyridine Urinary symptoms: frequent urination, hematuria and hesitancy   Urinary symptoms: no discolored urine, no foul-smelling urine and no bladder incontinence   Associated symptoms: abdominal pain, fever, nausea and vomiting   Associated symptoms: no flank pain, no genital lesions and no vaginal discharge   Risk factors: recurrent urinary tract infections     Past Medical History  Diagnosis Date  . Hypertension   . Bipolar disorder   . Diabetes mellitus type II   . Anxiety   . Hyperlipemia   . GERD (gastroesophageal reflux disease)   . TIA (transient ischemic attack)    Past Surgical History  Procedure Laterality Date  . Abdominal hysterectomy    . Appendectomy    . Right oophorectomy     Family History  Problem Relation Age of Onset  . Alcohol abuse Father   . Hypertension Father   . Diabetes Father   . Aneurysm Father   . COPD Father   . COPD Mother   . Colon cancer Mother    History  Substance Use Topics  . Smoking status: Never Smoker   . Smokeless tobacco: Never Used  . Alcohol Use: Yes     Comment: socially   OB History    No data available     Review of Systems  Constitutional: Positive for fever.  Gastrointestinal:  Positive for nausea, vomiting and abdominal pain.  Genitourinary: Positive for dysuria. Negative for flank pain and vaginal discharge.    Allergies  Ciprofloxacin; Darvon; Hydrocodone-acetaminophen; Morphine sulfate; Oxycodone-acetaminophen; Propoxyphene hcl; Propoxyphene n-acetaminophen; Sulfonamide derivatives; and Tramadol hcl  Home Medications   Prior to Admission medications   Medication Sig Start Date End Date Taking? Authorizing Provider  alprazolam Duanne Moron) 2 MG tablet Take 2 mg by mouth QID.    Historical Provider, MD  amoxicillin-clavulanate (AUGMENTIN) 875-125 MG per tablet Take 1 tablet by mouth 2 (two) times daily. Take with food 04/23/15   Kandra Nicolas, MD  aspirin 325 MG tablet Take 325 mg by mouth daily.      Historical Provider, MD  atorvastatin (LIPITOR) 20 MG tablet Take 20 mg by mouth daily.      Historical Provider, MD  cyclobenzaprine (FLEXERIL) 10 MG tablet Take 10 mg by mouth 3 (three) times daily as needed.    Historical Provider, MD  glipiZIDE (GLUCOTROL) 10 MG tablet Take 10 mg by mouth 2 (two) times daily before a meal.      Historical Provider, MD  ipratropium (ATROVENT) 0.02 % nebulizer solution Take 500 mcg by nebulization 4 (four) times daily.    Historical Provider, MD  lansoprazole (PREVACID) 30 MG capsule Take 30 mg by mouth daily.      Historical Provider, MD  metFORMIN (  GLUCOPHAGE) 1000 MG tablet Take 1,000 mg by mouth 2 (two) times daily with a meal.    Historical Provider, MD  promethazine (PHENERGAN) 25 MG tablet Take 1 tablet (25 mg total) by mouth every 6 (six) hours as needed for nausea or vomiting. 04/23/15   Kandra Nicolas, MD  sitaGLIPtin (JANUVIA) 100 MG tablet Take 100 mg by mouth daily.      Historical Provider, MD  traZODone (DESYREL) 100 MG tablet Take 100 mg by mouth at bedtime.      Historical Provider, MD  triamcinolone (KENALOG) 0.1 % paste Place thin layer on mouth ulcers four times daily 07/20/13   Kandra Nicolas, MD   BP 101/66 mmHg   Pulse 68  Temp(Src) 97.6 F (36.4 C) (Oral)  Resp 16  Ht 5\' 9"  (1.753 m)  Wt 131 lb (59.421 kg)  BMI 19.34 kg/m2  SpO2 95% Physical Exam Nursing notes and Vital Signs reviewed. Appearance:  Patient appears stated age, and in no acute distress Eyes:  Pupils are equal, round, and reactive to light and accomodation.  Extraocular movement is intact.  Conjunctivae are not inflamed  Nose:  Normal Pharynx:  Normal; moist mucous membranes  Neck:  Supple.  No adenopathy Lungs:  Clear to auscultation.  Breath sounds are equal.  Heart:  Regular rate and rhythm without murmurs, rubs, or gallops.  Abdomen:  Suprapubic tenderness present without masses or hepatosplenomegaly.  Bowel sounds are present.  No CVA or flank tenderness.  Extremities:  No edema.  No calf tenderness Skin:  No rash present.   ED Course  Procedures  None    Labs Reviewed  URINE CULTURE pending POCT urinalysis dipstick results inaccurate because of patient's use of AZO, and not recorded          MDM   1. Dysuria   2. Nausea and vomiting, vomiting of unspecified type    Urine culture pending.  Begin Augmentin 875 BID (patient's request) Phenergan 50mg  IM.  Rx for Phenergan 25mg  PO q6hr prn nausea.  Toradol 60mg  IM  Increase fluid intake. Followup with Family Doctor if not improved in one week.     Kandra Nicolas, MD 04/23/15 2043

## 2015-04-23 NOTE — Discharge Instructions (Signed)
Increase fluid intake.   Dysuria Dysuria is the medical term for pain with urination. There are many causes for dysuria, but urinary tract infection is the most common. If a urinalysis was performed it can show that there is a urinary tract infection. A urine culture confirms that you or your child is sick. You will need to follow up with a healthcare provider because:  If a urine culture was done you will need to know the culture results and treatment recommendations.  If the urine culture was positive, you or your child will need to be put on antibiotics or know if the antibiotics prescribed are the right antibiotics for your urinary tract infection.  If the urine culture is negative (no urinary tract infection), then other causes may need to be explored or antibiotics need to be stopped. Today laboratory work may have been done and there does not seem to be an infection. If cultures were done they will take at least 24 to 48 hours to be completed. Today x-rays may have been taken and they read as normal. No cause can be found for the problems. The x-rays may be re-read by a radiologist and you will be contacted if additional findings are made. You or your child may have been put on medications to help with this problem until you can see your primary caregiver. If the problems get better, see your primary caregiver if the problems return. If you were given antibiotics (medications which kill germs), take all of the mediations as directed for the full course of treatment.  If laboratory work was done, you need to find the results. Leave a telephone number where you can be reached. If this is not possible, make sure you find out how you are to get test results. HOME CARE INSTRUCTIONS   Drink lots of fluids. For adults, drink eight, 8 ounce glasses of clear juice or water a day. For children, replace fluids as suggested by your caregiver.  Empty the bladder often. Avoid holding urine for long  periods of time.  After a bowel movement, women should cleanse front to back, using each tissue only once.  Empty your bladder before and after sexual intercourse.  Take all the medicine given to you until it is gone. You may feel better in a few days, but TAKE ALL MEDICINE.  Avoid caffeine, tea, alcohol and carbonated beverages, because they tend to irritate the bladder.  In men, alcohol may irritate the prostate.  Only take over-the-counter or prescription medicines for pain, discomfort, or fever as directed by your caregiver.  If your caregiver has given you a follow-up appointment, it is very important to keep that appointment. Not keeping the appointment could result in a chronic or permanent injury, pain, and disability. If there is any problem keeping the appointment, you must call back to this facility for assistance. SEEK IMMEDIATE MEDICAL CARE IF:   Back pain develops.  A fever develops.  There is nausea (feeling sick to your stomach) or vomiting (throwing up).  Problems are no better with medications or are getting worse. MAKE SURE YOU:   Understand these instructions.  Will watch your condition.  Will get help right away if you are not doing well or get worse. Document Released: 08/01/2004 Document Revised: 01/26/2012 Document Reviewed: 06/08/2008 [facility] Patient Information 2015 [place], [place]. This information is not intended to replace advice given to you by your health care provider. Make sure you discuss any questions you have with your health care  provider.

## 2015-04-23 NOTE — ED Notes (Signed)
Reports onset of severe dysuria 36 hours ago; has taken AZO for comfort but has not helped; experiencing nausea and vomiting and desires promethazine injection; has variety of other medical complaints.

## 2015-04-25 LAB — URINE CULTURE
Colony Count: NO GROWTH
Organism ID, Bacteria: NO GROWTH

## 2015-04-26 ENCOUNTER — Telehealth: Payer: Self-pay | Admitting: Emergency Medicine

## 2015-05-21 ENCOUNTER — Encounter: Payer: Self-pay | Admitting: *Deleted

## 2015-05-21 ENCOUNTER — Emergency Department (INDEPENDENT_AMBULATORY_CARE_PROVIDER_SITE_OTHER)
Admission: EM | Admit: 2015-05-21 | Discharge: 2015-05-21 | Disposition: A | Discharge status: Home / Self Care | Payer: Medicare Other | Source: Home / Self Care | Attending: Family Medicine | Admitting: Family Medicine

## 2015-05-21 DIAGNOSIS — R112 Nausea with vomiting, unspecified: Secondary | ICD-10-CM

## 2015-05-21 DIAGNOSIS — R1084 Generalized abdominal pain: Secondary | ICD-10-CM

## 2015-05-21 MED ORDER — KETOROLAC TROMETHAMINE 60 MG/2ML IM SOLN
60.0000 mg | Freq: Once | INTRAMUSCULAR | Status: AC
  Administered 2015-05-21: 60 mg via INTRAMUSCULAR

## 2015-05-21 MED ORDER — PROMETHAZINE HCL 25 MG PO TABS: 25.0000 mg | ORAL_TABLET | Freq: Four times a day (QID) | ORAL | Status: DC | PRN

## 2015-05-21 MED ORDER — PROMETHAZINE HCL 25 MG/ML IJ SOLN
50.0000 mg | Freq: Once | INTRAMUSCULAR | Status: AC
  Administered 2015-05-21: 50 mg via INTRAMUSCULAR

## 2015-05-21 MED ORDER — AMOXICILLIN-POT CLAVULANATE 875-125 MG PO TABS: 1.0000 | ORAL_TABLET | Freq: Two times a day (BID) | ORAL | Status: DC

## 2015-05-21 NOTE — ED Notes (Signed)
Pt c/o nausea and vomiting x 2-3 days. Denies diarrhea. She took 2 Dramamine at 0200 today.

## 2015-05-21 NOTE — Discharge Instructions (Signed)
Begin clear liquids for about 12 hours, then may begin a BRAT diet (Bananas, Rice, Applesauce, Toast) when nausea and vomiting resolved.  Then gradually advance to a regular diet as tolerated.   Followup with family doctor if not improved 2 days. If symptoms become significantly worse during the night or over the weekend, proceed to the local emergency room.

## 2015-05-21 NOTE — ED Provider Notes (Signed)
CSN: [id number]     Arrival date & time 05/21/15  1510 History   First MD Initiated Contact with Patient 05/21/15 1543     Chief Complaint  Patient presents with  . Nausea  . Emesis      HPI Comments: Patient reports that she awoke with nausea/vomiting and abdominal pain last night.  No diarrhea.  Last normal bowel movement was two days ago.  No recent change in bowel movements.  No fevers, chills, and sweats.  No urinary symptoms.  She has had similar symptoms in the past, improved with Augmentin. She has a past history of esophageal stricture, and has a GI appointment later this month.  Patient is a 54 y.o. female presenting with vomiting. The history is provided by the patient.  Emesis Severity:  Moderate Duration:  1 day Timing:  Intermittent Quality:  Stomach contents Able to tolerate:  Liquids Progression:  Unchanged Chronicity:  Recurrent Recent urination:  Normal Relieved by:  None tried Worsened by:  Food smell Ineffective treatments:  None tried Associated symptoms: abdominal pain and headaches   Associated symptoms: no arthralgias, no chills, no cough, no diarrhea, no fever, no myalgias, no sore throat and no URI   Risk factors: no sick contacts, no suspect food intake and no travel to endemic areas     Past Medical History  Diagnosis Date  . Hypertension   . Bipolar disorder   . Diabetes mellitus type II   . Anxiety   . Hyperlipemia   . GERD (gastroesophageal reflux disease)   . TIA (transient ischemic attack)    Past Surgical History  Procedure Laterality Date  . Abdominal hysterectomy    . Appendectomy    . Right oophorectomy     Family History  Problem Relation Age of Onset  . Alcohol abuse Father   . Hypertension Father   . Diabetes Father   . Aneurysm Father   . COPD Father   . COPD Mother   . Colon cancer Mother    History  Substance Use Topics  . Smoking status: Never Smoker   . Smokeless tobacco: Never Used  . Alcohol Use: Yes   Comment: socially   OB History    No data available     Review of Systems  Constitutional: Negative for chills.  HENT: Negative for sore throat.   Gastrointestinal: Positive for vomiting and abdominal pain. Negative for diarrhea.  Musculoskeletal: Negative for myalgias and arthralgias.  Neurological: Positive for headaches.  All other systems reviewed and are negative.   Allergies  Ciprofloxacin; Darvon; Hydrocodone-acetaminophen; Morphine sulfate; Oxycodone-acetaminophen; Propoxyphene hcl; Propoxyphene n-acetaminophen; Sulfonamide derivatives; and Tramadol hcl  Home Medications   Prior to Admission medications   Medication Sig Start Date End Date Taking? Authorizing Provider  alprazolam Duanne Moron) 2 MG tablet Take 2 mg by mouth QID.    Historical Provider, MD  amoxicillin-clavulanate (AUGMENTIN) 875-125 MG per tablet Take 1 tablet by mouth 2 (two) times daily. Take with food 05/21/15   Kandra Nicolas, MD  aspirin 325 MG tablet Take 325 mg by mouth daily.      Historical Provider, MD  atorvastatin (LIPITOR) 20 MG tablet Take 20 mg by mouth daily.      Historical Provider, MD  cyclobenzaprine (FLEXERIL) 10 MG tablet Take 10 mg by mouth 3 (three) times daily as needed.    Historical Provider, MD  glipiZIDE (GLUCOTROL) 10 MG tablet Take 10 mg by mouth 2 (two) times daily before a meal.  Historical Provider, MD  ipratropium (ATROVENT) 0.02 % nebulizer solution Take 500 mcg by nebulization 4 (four) times daily.    Historical Provider, MD  lansoprazole (PREVACID) 30 MG capsule Take 30 mg by mouth daily.      Historical Provider, MD  metFORMIN (GLUCOPHAGE) 1000 MG tablet Take 1,000 mg by mouth 2 (two) times daily with a meal.    Historical Provider, MD  promethazine (PHENERGAN) 25 MG tablet Take 1 tablet (25 mg total) by mouth every 6 (six) hours as needed for nausea or vomiting. 05/21/15   Kandra Nicolas, MD  sitaGLIPtin (JANUVIA) 100 MG tablet Take 100 mg by mouth daily.      Historical  Provider, MD  traZODone (DESYREL) 100 MG tablet Take 100 mg by mouth at bedtime.      Historical Provider, MD  triamcinolone (KENALOG) 0.1 % paste Place thin layer on mouth ulcers four times daily 07/20/13   Kandra Nicolas, MD   BP 96/68 mmHg  Pulse 78  Temp(Src) 98.1 F (36.7 C) (Oral)  Resp 20  SpO2 96% Physical Exam Nursing notes and Vital Signs reviewed. Appearance:  Patient appears stated age, and in no acute distress.  She is alert and oriented.  Eyes:  Pupils are equal, round, and reactive to light and accomodation.  Extraocular movement is intact.  Conjunctivae are not inflamed  Nose:  Normal  Pharynx:  Mild decrease in moisture, otherwise normal Neck:  Supple.  No adenopathy Lungs:  Clear to auscultation.  Breath sounds are equal.  Heart:  Regular rate and rhythm without murmurs, rubs, or gallops.  Abdomen:  Nontender without masses or hepatosplenomegaly.  Bowel sounds are present.  No CVA or flank tenderness.  Extremities:  No edema.  No calf tenderness Skin:  No rash present.   ED Course  Procedures none   MDM   1. Nausea and vomiting, vomiting of unspecified type   2. Generalized abdominal pain; ?diverticulitis    Phenergan 50mg  IM (at patient's request) and Toradol 60mg  IM.  Begin Augmentin 875mg  BID pc. Rx for Phenergan 25mg  PO Begin clear liquids for about 12 hours, then may begin a BRAT diet (Bananas, Rice, Applesauce, Toast) when nausea and vomiting resolved.  Then gradually advance to a regular diet as tolerated.   Followup with family doctor if not improved 2 days. If symptoms become significantly worse during the night or over the weekend, proceed to the local emergency room.  Followup with PCP and gastroenterologist as scheduled.    Kandra Nicolas, MD 05/21/15 [phone]

## 2015-11-23 ENCOUNTER — Encounter: Payer: Self-pay | Admitting: *Deleted

## 2015-11-23 ENCOUNTER — Emergency Department (INDEPENDENT_AMBULATORY_CARE_PROVIDER_SITE_OTHER)
Admission: EM | Admit: 2015-11-23 | Discharge: 2015-11-23 | Disposition: A | Discharge status: Home / Self Care | Payer: Medicare Other | Source: Home / Self Care | Attending: Family Medicine | Admitting: Family Medicine

## 2015-11-23 DIAGNOSIS — R112 Nausea with vomiting, unspecified: Secondary | ICD-10-CM | POA: Diagnosis not present

## 2015-11-23 DIAGNOSIS — J111 Influenza due to unidentified influenza virus with other respiratory manifestations: Secondary | ICD-10-CM | POA: Diagnosis not present

## 2015-11-23 DIAGNOSIS — R69 Illness, unspecified: Principal | ICD-10-CM

## 2015-11-23 LAB — POCT INFLUENZA A/B
Influenza A, POC: NEGATIVE
Influenza B, POC: NEGATIVE

## 2015-11-23 LAB — POCT RAPID STREP A (OFFICE): Rapid Strep A Screen: NEGATIVE

## 2015-11-23 MED ORDER — PROMETHAZINE HCL 25 MG/ML IJ SOLN
25.0000 mg | Freq: Once | INTRAMUSCULAR | Status: AC
  Administered 2015-11-23: 25 mg via INTRAMUSCULAR

## 2015-11-23 MED ORDER — PROMETHAZINE HCL 25 MG PO TABS
25.0000 mg | ORAL_TABLET | Freq: Four times a day (QID) | ORAL | Status: DC | PRN
Start: 2015-11-23 — End: 2016-08-02

## 2015-11-23 MED ORDER — GUAIFENESIN-CODEINE 100-10 MG/5ML PO SOLN: ORAL | Status: DC

## 2015-11-23 MED ORDER — KETOROLAC TROMETHAMINE 60 MG/2ML IM SOLN
60.0000 mg | Freq: Once | INTRAMUSCULAR | Status: AC
  Administered 2015-11-23: 60 mg via INTRAMUSCULAR

## 2015-11-23 MED ORDER — OSELTAMIVIR PHOSPHATE 75 MG PO CAPS: 75.0000 mg | ORAL_CAPSULE | Freq: Two times a day (BID) | ORAL | Status: DC

## 2015-11-23 NOTE — ED Notes (Signed)
Pt c/o 2 days of body aches, fever, cough, vomiting and more recently dizziness.

## 2015-11-23 NOTE — Discharge Instructions (Signed)
Begin clear liquids for 12 hours, then gradually advance diet.  If increased cough develops, take plain guaifenesin (1200mg  extended release tabs such as Mucinex) twice daily, with plenty of water, for cough and congestion.  May add Pseudoephedrine (30mg , one or two every 4 to 6 hours) for sinus congestion.  Get adequate rest.   Try warm salt water gargles for sore throat.  Stop all antihistamines for now, and other non-prescription cough/cold preparations.   Follow-up with family doctor if not improving about 7 to10 days.

## 2015-11-23 NOTE — ED Provider Notes (Signed)
CSN: [id number]     Arrival date & time 11/23/15  1605 History   First MD Initiated Contact with Patient 11/23/15 1700     Chief Complaint  Patient presents with  . Dizziness  . Generalized Body Aches  . Emesis      HPI Comments: Complains of 2 day history flu-like illness including myalgias, headache, fever/chills, fatigue, and cough.  Also has dizziness, mild nasal congestion and sore throat.  Cough is non-productive and somewhat worse at night.  No pleuritic pain or shortness of breath.  She has had stomach cramps, and nausea with vomiting although she is able to take fluids.  The history is provided by the patient and the spouse.    Past Medical History  Diagnosis Date  . Hypertension   . Bipolar disorder ([place])   . Diabetes mellitus type II   . Anxiety   . Hyperlipemia   . GERD (gastroesophageal reflux disease)   . TIA (transient ischemic attack)    Past Surgical History  Procedure Laterality Date  . Abdominal hysterectomy    . Appendectomy    . Right oophorectomy     Family History  Problem Relation Age of Onset  . Alcohol abuse Father   . Hypertension Father   . Diabetes Father   . Aneurysm Father   . COPD Father   . COPD Mother   . Colon cancer Mother    Social History  Substance Use Topics  . Smoking status: Never Smoker   . Smokeless tobacco: Never Used  . Alcohol Use: Yes     Comment: socially   OB History    No data available     Review of Systems + sore throat + cough No pleuritic pain + wheezing + nasal congestion + post-nasal drainage No sinus pain/pressure No itchy/red eyes ? earache No hemoptysis No SOB + fever, + chills + nausea + vomiting + abdominal pain No diarrhea No urinary symptoms No skin rash + fatigue + myalgias + headache Used OTC meds without relief  Allergies  Ciprofloxacin; Darvon; Hydrocodone-acetaminophen; Morphine sulfate; Oxycodone-acetaminophen; Propoxyphene hcl; Propoxyphene n-acetaminophen; Sulfonamide  derivatives; and Tramadol hcl  Home Medications   Prior to Admission medications   Medication Sig Start Date End Date Taking? Authorizing Provider  alprazolam Duanne Moron) 2 MG tablet Take 2 mg by mouth QID.    Historical Provider, MD  aspirin 325 MG tablet Take 325 mg by mouth daily.      Historical Provider, MD  atorvastatin (LIPITOR) 20 MG tablet Take 20 mg by mouth daily.      Historical Provider, MD  cyclobenzaprine (FLEXERIL) 10 MG tablet Take 10 mg by mouth 3 (three) times daily as needed.    Historical Provider, MD  glipiZIDE (GLUCOTROL) 10 MG tablet Take 10 mg by mouth 2 (two) times daily before a meal.      Historical Provider, MD  guaiFENesin-codeine 100-10 MG/5ML syrup Take 39mL by mouth at bedtime as needed for cough 11/23/15   Kandra Nicolas, MD  ipratropium (ATROVENT) 0.02 % nebulizer solution Take 500 mcg by nebulization 4 (four) times daily.    Historical Provider, MD  lansoprazole (PREVACID) 30 MG capsule Take 30 mg by mouth daily.      Historical Provider, MD  metFORMIN (GLUCOPHAGE) 1000 MG tablet Take 1,000 mg by mouth 2 (two) times daily with a meal.    Historical Provider, MD  oseltamivir (TAMIFLU) 75 MG capsule Take 1 capsule (75 mg total) by mouth every 12 (twelve) hours.  11/23/15   Kandra Nicolas, MD  promethazine (PHENERGAN) 25 MG tablet Take 1 tablet (25 mg total) by mouth every 6 (six) hours as needed for nausea or vomiting. 11/23/15   Kandra Nicolas, MD  sitaGLIPtin (JANUVIA) 100 MG tablet Take 100 mg by mouth daily.      Historical Provider, MD  traZODone (DESYREL) 100 MG tablet Take 100 mg by mouth at bedtime.      Historical Provider, MD  triamcinolone (KENALOG) 0.1 % paste Place thin layer on mouth ulcers four times daily 07/20/13   Kandra Nicolas, MD   Meds Ordered and Administered this Visit   Medications  promethazine (PHENERGAN) injection 25 mg (25 mg Intramuscular Given 11/23/15 1730)  ketorolac (TORADOL) injection 60 mg (60 mg Intramuscular Given 11/23/15 1735)     BP 112/77 mmHg  Pulse 83  Temp(Src) 98.3 F (36.8 C) (Oral)  Resp 18  SpO2 98% No data found.   Physical Exam Nursing notes and Vital Signs reviewed. Appearance:  Patient appears stated age, and uncomfortable but in no acute distress Eyes:  Pupils are equal, round, and reactive to light and accomodation.  Extraocular movement is intact.  Conjunctivae are not inflamed  Ears:  Canals normal.  Tympanic membranes normal.  Nose:  Mildly congested turbinates.  No sinus tenderness.    Mouth:  moist mucous membranes  Pharynx:  Erythematous Neck:  Supple.  Tender enlarged posterior nodes are palpated bilaterally  Lungs:  Clear to auscultation.  Breath sounds are equal.  Moving air well. Heart:  Regular rate and rhythm without murmurs, rubs, or gallops.  Abdomen:  Nontender without masses or hepatosplenomegaly.  Bowel sounds are present.  No CVA or flank tenderness.  Extremities:  No edema.  Skin:  No rash present.   ED Course  Procedures none    Labs Reviewed  POCT INFLUENZA A/B negative  POCT RAPID STREP A (OFFICE) negative     MDM   1. Influenza-like illness; ?false negative flu test   2. Non-intractable vomiting with nausea, unspecified vomiting type    Phenergan 25mg  IM, and Toradol 60mg  IM Begin Tamiflu.  Rx for Robitussin [facility] for night time cough (she notes that she has taken codeine without adverse effect).  Phenergan for nausea. Begin clear liquids for 12 hours, then gradually advance diet.  If increased cough develops, take plain guaifenesin (1200mg  extended release tabs such as Mucinex) twice daily, with plenty of water, for cough and congestion.  May add Pseudoephedrine (30mg , one or two every 4 to 6 hours) for sinus congestion.  Get adequate rest.   Try warm salt water gargles for sore throat.  Stop all antihistamines for now, and other non-prescription cough/cold preparations.   Follow-up with family doctor if not improving about 7 to10 days.     Kandra Nicolas, MD 11/27/15 [phone]

## 2016-03-11 ENCOUNTER — Encounter: Payer: Self-pay | Admitting: Physician Assistant

## 2016-03-11 ENCOUNTER — Ambulatory Visit (INDEPENDENT_AMBULATORY_CARE_PROVIDER_SITE_OTHER): Payer: Medicare Other | Admitting: Physician Assistant

## 2016-03-11 VITALS — BP 145/101 | HR 101 | Ht 69.0 in | Wt 142.0 lb

## 2016-03-11 DIAGNOSIS — M25511 Pain in right shoulder: Secondary | ICD-10-CM | POA: Insufficient documentation

## 2016-03-11 DIAGNOSIS — J441 Chronic obstructive pulmonary disease with (acute) exacerbation: Secondary | ICD-10-CM | POA: Diagnosis not present

## 2016-03-11 DIAGNOSIS — E119 Type 2 diabetes mellitus without complications: Secondary | ICD-10-CM | POA: Insufficient documentation

## 2016-03-11 DIAGNOSIS — R195 Other fecal abnormalities: Secondary | ICD-10-CM

## 2016-03-11 DIAGNOSIS — F3162 Bipolar disorder, current episode mixed, moderate: Secondary | ICD-10-CM | POA: Diagnosis not present

## 2016-03-11 DIAGNOSIS — G4700 Insomnia, unspecified: Secondary | ICD-10-CM | POA: Diagnosis not present

## 2016-03-11 DIAGNOSIS — E1169 Type 2 diabetes mellitus with other specified complication: Secondary | ICD-10-CM | POA: Insufficient documentation

## 2016-03-11 DIAGNOSIS — R0789 Other chest pain: Secondary | ICD-10-CM | POA: Insufficient documentation

## 2016-03-11 DIAGNOSIS — R5383 Other fatigue: Secondary | ICD-10-CM

## 2016-03-11 DIAGNOSIS — E785 Hyperlipidemia, unspecified: Secondary | ICD-10-CM

## 2016-03-11 MED ORDER — GLIPIZIDE 10 MG PO TABS
10.0000 mg | ORAL_TABLET | Freq: Every day | ORAL | Status: DC
Start: 2016-03-11 — End: 2016-05-13

## 2016-03-11 MED ORDER — ALBUTEROL SULFATE HFA 108 (90 BASE) MCG/ACT IN AERS
2.0000 | INHALATION_SPRAY | Freq: Four times a day (QID) | RESPIRATORY_TRACT | Status: DC | PRN
Start: 2016-03-11 — End: 2016-03-11

## 2016-03-11 MED ORDER — ACETAMINOPHEN-CODEINE #4 300-60 MG PO TABS
1.0000 | ORAL_TABLET | Freq: Three times a day (TID) | ORAL | Status: DC | PRN
Start: 2016-03-11 — End: 2016-04-07

## 2016-03-11 MED ORDER — METFORMIN HCL 500 MG PO TABS: ORAL_TABLET | ORAL | Status: DC

## 2016-03-11 MED ORDER — FUROSEMIDE 40 MG PO TABS: 40.0000 mg | ORAL_TABLET | Freq: Every day | ORAL | Status: DC | PRN

## 2016-03-11 MED ORDER — CETIRIZINE HCL 10 MG PO TABS: 10.0000 mg | ORAL_TABLET | Freq: Every day | ORAL | Status: DC

## 2016-03-11 MED ORDER — MELOXICAM 15 MG PO TABS: 15.0000 mg | ORAL_TABLET | Freq: Every day | ORAL | Status: DC

## 2016-03-11 MED ORDER — FLUTICASONE PROPIONATE 50 MCG/ACT NA SUSP: 2.0000 | Freq: Every day | NASAL | Status: DC

## 2016-03-11 MED ORDER — ALBUTEROL SULFATE HFA 108 (90 BASE) MCG/ACT IN AERS: 2.0000 | INHALATION_SPRAY | Freq: Four times a day (QID) | RESPIRATORY_TRACT | Status: DC | PRN

## 2016-03-11 MED ORDER — DIPHENOXYLATE-ATROPINE 2.5-0.025 MG PO TABS: ORAL_TABLET | ORAL | Status: DC

## 2016-03-11 MED ORDER — BUDESONIDE-FORMOTEROL FUMARATE 160-4.5 MCG/ACT IN AERO: 2.0000 | INHALATION_SPRAY | Freq: Two times a day (BID) | RESPIRATORY_TRACT | Status: DC

## 2016-03-11 MED ORDER — GLIPIZIDE 10 MG PO TABS: 10.0000 mg | ORAL_TABLET | Freq: Every day | ORAL | Status: DC

## 2016-03-11 NOTE — Patient Instructions (Signed)
Continue on diabetic medications.

## 2016-03-12 ENCOUNTER — Encounter: Payer: Self-pay | Admitting: Physician Assistant

## 2016-03-12 ENCOUNTER — Telehealth: Payer: Self-pay | Admitting: Physician Assistant

## 2016-03-12 DIAGNOSIS — R7989 Other specified abnormal findings of blood chemistry: Secondary | ICD-10-CM | POA: Insufficient documentation

## 2016-03-12 DIAGNOSIS — R748 Abnormal levels of other serum enzymes: Secondary | ICD-10-CM | POA: Insufficient documentation

## 2016-03-12 DIAGNOSIS — E559 Vitamin D deficiency, unspecified: Secondary | ICD-10-CM | POA: Insufficient documentation

## 2016-03-12 DIAGNOSIS — R195 Other fecal abnormalities: Secondary | ICD-10-CM | POA: Insufficient documentation

## 2016-03-12 LAB — COMPREHENSIVE METABOLIC PANEL
ALBUMIN: 4.5 g/dL (ref 3.6–5.1)
ALT: 33 U/L — ABNORMAL HIGH (ref 6–29)
AST: 40 U/L — AB (ref 10–35)
Alkaline Phosphatase: 73 U/L (ref 33–130)
BILIRUBIN TOTAL: 0.3 mg/dL (ref 0.2–1.2)
BUN: 16 mg/dL (ref 7–25)
CO2: 21 mmol/L (ref 20–31)
CREATININE: 0.98 mg/dL (ref 0.50–1.05)
Calcium: 10 mg/dL (ref 8.6–10.4)
Chloride: 102 mmol/L (ref 98–110)
GLUCOSE: 185 mg/dL — AB (ref 65–99)
Potassium: 3.9 mmol/L (ref 3.5–5.3)
Sodium: 137 mmol/L (ref 135–146)
Total Protein: 7.4 g/dL (ref 6.1–8.1)

## 2016-03-12 LAB — VITAMIN D 25 HYDROXY (VIT D DEFICIENCY, FRACTURES): VIT D 25 HYDROXY: 14 ng/mL — AB (ref 30–100)

## 2016-03-12 LAB — CBC
HCT: 43.3 % (ref 35.0–45.0)
Hemoglobin: 14.6 g/dL (ref 11.7–15.5)
MCH: 31.4 pg (ref 27.0–33.0)
MCHC: 33.7 g/dL (ref 32.0–36.0)
MCV: 93.1 fL (ref 80.0–100.0)
MPV: 11.1 fL (ref 7.5–12.5)
PLATELETS: 212 10*3/uL (ref 140–400)
RBC: 4.65 MIL/uL (ref 3.80–5.10)
RDW: 13.1 % (ref 11.0–15.0)
WBC: 6.8 10*3/uL (ref 3.8–10.8)

## 2016-03-12 LAB — TSH: TSH: 5.25 mIU/L — ABNORMAL HIGH

## 2016-03-12 LAB — VITAMIN B12: Vitamin B-12: 634 pg/mL (ref 200–1100)

## 2016-03-12 LAB — C-REACTIVE PROTEIN: CRP: <0.5 mg/dL (ref <0.60)

## 2016-03-12 LAB — HEMOGLOBIN A1C
HEMOGLOBIN A1C: 7.5 % — AB (ref <5.7)
Mean Plasma Glucose: 169 mg/dL

## 2016-03-12 LAB — FERRITIN: Ferritin: 319 ng/mL — ABNORMAL HIGH (ref 10–232)

## 2016-03-12 LAB — SEDIMENTATION RATE: Sed Rate: 15 mm/hr (ref 0–30)

## 2016-03-12 MED ORDER — VITAMIN D (ERGOCALCIFEROL) 1.25 MG (50000 UNIT) PO CAPS: 50000.0000 [IU] | ORAL_CAPSULE | ORAL | Status: AC

## 2016-03-12 NOTE — Telephone Encounter (Signed)
Amber please add this to labs comments  a1c is 7.5 goal is under 7. i see she was on januiva in the past. Can we add this again to get to goal?

## 2016-03-12 NOTE — Progress Notes (Signed)
Subjective:    Patient ID: PREZLEE EISENBRAUN, female    DOB: March 07, 1961, 55 y.o.   MRN: [id number]  HPI Pt is a 55 yo female who presents to the clinic to establish care.   . Active Ambulatory Problems    Diagnosis Date Noted  . DIABETES MELLITUS, TYPE II 03/11/2010  . HYPERLIPIDEMIA 03/11/2010  . ALLERGIC RHINITIS 06/19/2010  . BRONCHITIS 06/17/2010  . G E R D 06/19/2010  . SHOULDER PAIN, RIGHT 09/23/2010  . ELBOW PAIN, RIGHT 09/23/2010  . WRIST PAIN, RIGHT 09/23/2010  . KNEE PAIN, RIGHT, ACUTE 09/23/2010  . MYALGIA 03/28/2010  . MUSCULOSKELETAL PAIN 03/11/2010  . COUGH 06/27/2010  . ANKLE SPRAIN, RIGHT 09/23/2010  . LUMBAR STRAIN, ACUTE 05/25/2010  . Bipolar 1 disorder, mixed ([place]) 10/03/2011  . Controlled type 2 diabetes mellitus without complication, without long-term current use of insulin ([place]) 03/11/2016  . Bipolar 1 disorder, mixed, moderate ([place]) 03/11/2016  . COPD exacerbation ([place]) 03/11/2016  . Insomnia 03/11/2016  . Hyperlipidemia associated with type 2 diabetes mellitus ([place]) 03/11/2016  . No energy 03/11/2016  . Right shoulder pain 03/11/2016  . Atypical chest pain 03/11/2016  . Vitamin D deficiency 03/12/2016  . Elevated liver enzymes 03/12/2016  . Elevated TSH 03/12/2016   Resolved Ambulatory Problems    Diagnosis Date Noted  . No Resolved Ambulatory Problems   Past Medical History  Diagnosis Date  . Hypertension   . Bipolar disorder ([place])   . Diabetes mellitus type II   . Anxiety   . Hyperlipemia   . GERD (gastroesophageal reflux disease)   . TIA (transient ischemic attack)    .Marland Kitchen Family History  Problem Relation Age of Onset  . Alcohol abuse Father   . Hypertension Father   . Diabetes Father   . Aneurysm Father   . COPD Father   . COPD Mother   . Colon cancer Mother    .Marland Kitchen Social History   Social History  . Marital Status: Single    Spouse Name: N/A  . Number of Children: N/A  . Years of Education: N/A   Occupational History   . Not on file.   Social History Main Topics  . Smoking status: Never Smoker   . Smokeless tobacco: Never Used  . Alcohol Use: Yes     Comment: socially  . Drug Use: No  . Sexual Activity: Yes     Comment: infreauent   Other Topics Concern  . Not on file   Social History Narrative   Pt would like some medication refills.   She just wants to feel like her PCP cares. She is having a lot of whole body aches. She is having some CP as well. Not at rest. Only with lifting and pulling. Goes away at rest. Over her left chest. She has not tried anything to make better. Not increased with walking. She does admit to more SOB. No increase in lower extremity swelling. She does have COPD and on symbicort.      Review of Systems  All other systems reviewed and are negative.      Objective:   Physical Exam  Constitutional: She is oriented to person, place, and time. She appears well-developed and well-nourished.  HENT:  Head: Normocephalic and atraumatic.  Cardiovascular: Normal rate, regular rhythm and normal heart sounds.   Pulmonary/Chest: Effort normal and breath sounds normal.  Neurological: She is alert and oriented to person, place, and time.  Psychiatric: She has a normal mood and  affect. Her behavior is normal.          Assessment & Plan:  DM, type II- will get a1c in labs and adjust medications accordingly. On STATIN.  Loose stools- lomotil given as needed. Likely due to metformin and why she takes low dose and multiple times a day.   Atypical chest pain- EKG NSR at 71. No acute ST changes. No arrhthymias. Does not appear to be cardiac likely muscloskelatal. Given mobic to use as needed. Discussed ice.   COPD- symbicort increased to 160/4.5 2puffs bid. Follow up in 3 months. Albuterol inhaler as needed.   Hyperlipidemia- on pravastatin. Per pt last labs feburary 2017.   multiple joint pain/right shoulder pain- follow up for another visit to discuss. Tylenol #4  refilled today.   No energy- discussed sleep apnea testing. Pt declined today due to cost. Will consider after fatigue panel ordered today. Discussed OTC options for sleep with unisom and melatonin. Continue trazodone.   Bipolar/insomnia- managed by Dr. Percell Miller love.

## 2016-03-13 ENCOUNTER — Other Ambulatory Visit: Payer: Self-pay | Admitting: *Deleted

## 2016-03-13 NOTE — Telephone Encounter (Signed)
Tried calling pt. There was no answer & I was unable to leave vm.

## 2016-03-19 ENCOUNTER — Encounter: Payer: Self-pay | Admitting: Physician Assistant

## 2016-03-19 DIAGNOSIS — K760 Fatty (change of) liver, not elsewhere classified: Secondary | ICD-10-CM | POA: Insufficient documentation

## 2016-04-07 ENCOUNTER — Other Ambulatory Visit: Payer: Self-pay | Admitting: *Deleted

## 2016-04-07 ENCOUNTER — Telehealth: Payer: Self-pay | Admitting: Physician Assistant

## 2016-04-07 MED ORDER — ACETAMINOPHEN-CODEINE #4 300-60 MG PO TABS: 1.0000 | ORAL_TABLET | Freq: Three times a day (TID) | ORAL | Status: DC | PRN

## 2016-04-07 NOTE — Telephone Encounter (Signed)
Pt stated she is out of her Tyenol #4 and would like to know if she can get it refilled. Thanks

## 2016-04-07 NOTE — Telephone Encounter (Signed)
Done

## 2016-04-07 NOTE — Telephone Encounter (Signed)
Pt stated she needs a refill on her Tylenol #4 to [facility] and would like a call to let her know if it has been sent. Thanks

## 2016-04-10 ENCOUNTER — Other Ambulatory Visit: Payer: Self-pay | Admitting: Physician Assistant

## 2016-04-10 NOTE — Telephone Encounter (Signed)
Called and spoke with the Pharmacist regarding Magic Mouthwash Rx. They stated she has not had this Rx filled in over a year. The old Rx included: Lidocaine, Benadryl, Nystatin, and Hydrocortisone.   FYI: They also wanted to advise of a note that was placed on her chart from her old PCP. The Pt was trying to fill the Tylenol #4 Rx too early, they placed a note on file that it could only be filled every 30 days. Pt got mad at that Provider for not writing the Rx or approving early fills so she left that Practice and became a Pt at our Practice.

## 2016-04-10 NOTE — Telephone Encounter (Signed)
Pt called to request an Rx for magic mouthwash. Pt states she has some at home but the Rx is old. She uses this Rx for "mouth abrasions" she gets "from time to time." will route to PCP.

## 2016-04-10 NOTE — Telephone Encounter (Signed)
Im ok to refill but usually we have to give pharmacy the break down of what is in it. Has she ever gotten it filled at a local pharmacy?

## 2016-04-11 ENCOUNTER — Telehealth: Payer: Self-pay

## 2016-04-11 ENCOUNTER — Other Ambulatory Visit: Payer: Self-pay | Admitting: Physician Assistant

## 2016-04-11 MED ORDER — MAGIC MOUTHWASH W/LIDOCAINE: 10.0000 mL | Freq: Four times a day (QID) | ORAL | Status: DC

## 2016-04-11 MED ORDER — MAGIC MOUTHWASH: 5.0000 mL | Freq: Three times a day (TID) | ORAL | Status: DC | PRN

## 2016-04-11 NOTE — Telephone Encounter (Signed)
Printed off can fax in am.

## 2016-05-06 ENCOUNTER — Ambulatory Visit: Payer: Self-pay | Admitting: Physician Assistant

## 2016-05-13 ENCOUNTER — Encounter: Payer: Self-pay | Admitting: Physician Assistant

## 2016-05-13 ENCOUNTER — Ambulatory Visit (INDEPENDENT_AMBULATORY_CARE_PROVIDER_SITE_OTHER): Payer: Medicare Other | Admitting: Physician Assistant

## 2016-05-13 VITALS — BP 119/73 | HR 85 | Ht 69.0 in | Wt 138.0 lb

## 2016-05-13 DIAGNOSIS — R6882 Decreased libido: Secondary | ICD-10-CM | POA: Diagnosis not present

## 2016-05-13 DIAGNOSIS — M25511 Pain in right shoulder: Secondary | ICD-10-CM

## 2016-05-13 DIAGNOSIS — R300 Dysuria: Secondary | ICD-10-CM | POA: Diagnosis not present

## 2016-05-13 DIAGNOSIS — E119 Type 2 diabetes mellitus without complications: Secondary | ICD-10-CM

## 2016-05-13 DIAGNOSIS — N952 Postmenopausal atrophic vaginitis: Secondary | ICD-10-CM

## 2016-05-13 DIAGNOSIS — N3001 Acute cystitis with hematuria: Secondary | ICD-10-CM | POA: Diagnosis not present

## 2016-05-13 DIAGNOSIS — R319 Hematuria, unspecified: Secondary | ICD-10-CM

## 2016-05-13 DIAGNOSIS — F4310 Post-traumatic stress disorder, unspecified: Secondary | ICD-10-CM

## 2016-05-13 LAB — POCT URINALYSIS DIPSTICK
Bilirubin, UA: NEGATIVE
Glucose, UA: NEGATIVE
Nitrite, UA: POSITIVE
UROBILINOGEN UA: 0.2
pH, UA: 5.5

## 2016-05-13 MED ORDER — NITROFURANTOIN MONOHYD MACRO 100 MG PO CAPS: 100.0000 mg | ORAL_CAPSULE | Freq: Two times a day (BID) | ORAL | Status: DC

## 2016-05-13 MED ORDER — ESTROGENS, CONJUGATED 0.625 MG/GM VA CREA: 1.0000 | TOPICAL_CREAM | Freq: Every day | VAGINAL | Status: DC

## 2016-05-13 MED ORDER — DIPHENOXYLATE-ATROPINE 2.5-0.025 MG PO TABS: ORAL_TABLET | ORAL | Status: DC

## 2016-05-13 MED ORDER — GLIPIZIDE 10 MG PO TABS: 10.0000 mg | ORAL_TABLET | Freq: Two times a day (BID) | ORAL | Status: DC

## 2016-05-13 MED ORDER — ACETAMINOPHEN-CODEINE #4 300-60 MG PO TABS: 1.0000 | ORAL_TABLET | Freq: Three times a day (TID) | ORAL | Status: DC | PRN

## 2016-05-13 MED ORDER — KETOROLAC TROMETHAMINE 30 MG/ML IJ SOLN
30.0000 mg | Freq: Once | INTRAMUSCULAR | Status: AC
  Administered 2016-05-13: 30 mg via INTRAMUSCULAR
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
## 2016-05-14 ENCOUNTER — Ambulatory Visit: Payer: Self-pay | Admitting: Physician Assistant

## 2016-05-14 DIAGNOSIS — R6882 Decreased libido: Secondary | ICD-10-CM | POA: Insufficient documentation

## 2016-05-14 DIAGNOSIS — F4310 Post-traumatic stress disorder, unspecified: Secondary | ICD-10-CM | POA: Insufficient documentation

## 2016-05-16 LAB — URINE CULTURE

## 2016-05-20 ENCOUNTER — Other Ambulatory Visit: Payer: Self-pay | Admitting: Physician Assistant

## 2016-06-03 ENCOUNTER — Other Ambulatory Visit: Payer: Self-pay | Admitting: Physician Assistant

## 2016-06-23 ENCOUNTER — Ambulatory Visit: Payer: Self-pay | Admitting: Physician Assistant

## 2016-06-26 ENCOUNTER — Ambulatory Visit (INDEPENDENT_AMBULATORY_CARE_PROVIDER_SITE_OTHER): Payer: Medicare Other | Admitting: Physician Assistant

## 2016-06-26 ENCOUNTER — Encounter: Payer: Self-pay | Admitting: Physician Assistant

## 2016-06-26 ENCOUNTER — Other Ambulatory Visit: Payer: Self-pay | Admitting: Physician Assistant

## 2016-06-26 VITALS — BP 117/65 | HR 86 | Temp 97.8°F

## 2016-06-26 DIAGNOSIS — N952 Postmenopausal atrophic vaginitis: Secondary | ICD-10-CM

## 2016-06-26 DIAGNOSIS — M799 Soft tissue disorder, unspecified: Secondary | ICD-10-CM | POA: Diagnosis not present

## 2016-06-26 DIAGNOSIS — R300 Dysuria: Secondary | ICD-10-CM | POA: Diagnosis not present

## 2016-06-26 DIAGNOSIS — R319 Hematuria, unspecified: Secondary | ICD-10-CM

## 2016-06-26 DIAGNOSIS — R3989 Other symptoms and signs involving the genitourinary system: Secondary | ICD-10-CM

## 2016-06-26 DIAGNOSIS — N8110 Cystocele, unspecified: Secondary | ICD-10-CM

## 2016-06-26 DIAGNOSIS — N3289 Other specified disorders of bladder: Secondary | ICD-10-CM

## 2016-06-26 DIAGNOSIS — N898 Other specified noninflammatory disorders of vagina: Secondary | ICD-10-CM

## 2016-06-26 DIAGNOSIS — F4310 Post-traumatic stress disorder, unspecified: Secondary | ICD-10-CM

## 2016-06-26 DIAGNOSIS — M5440 Lumbago with sciatica, unspecified side: Secondary | ICD-10-CM

## 2016-06-26 DIAGNOSIS — R6882 Decreased libido: Secondary | ICD-10-CM

## 2016-06-26 DIAGNOSIS — N390 Urinary tract infection, site not specified: Secondary | ICD-10-CM | POA: Diagnosis not present

## 2016-06-26 DIAGNOSIS — IMO0002 Reserved for concepts with insufficient information to code with codable children: Secondary | ICD-10-CM

## 2016-06-26 DIAGNOSIS — G8929 Other chronic pain: Secondary | ICD-10-CM

## 2016-06-26 LAB — POCT URINALYSIS DIPSTICK
BILIRUBIN UA: NEGATIVE
GLUCOSE UA: 250
KETONES UA: NEGATIVE
Nitrite, UA: POSITIVE
Protein, UA: NEGATIVE
Spec Grav, UA: 1.02
Urobilinogen, UA: 0.2
pH, UA: 5.5

## 2016-06-26 MED ORDER — ACETAMINOPHEN-CODEINE #4 300-60 MG PO TABS: 1.0000 | ORAL_TABLET | Freq: Three times a day (TID) | ORAL | 0 refills | Status: DC | PRN

## 2016-06-26 MED ORDER — KETOROLAC TROMETHAMINE 60 MG/2ML IM SOLN
60.0000 mg | Freq: Once | INTRAMUSCULAR | Status: AC
  Administered 2016-06-26: 60 mg via INTRAMUSCULAR

## 2016-06-26 MED ORDER — CEFTRIAXONE SODIUM 1 G IJ SOLR
1.0000 g | Freq: Once | INTRAMUSCULAR | Status: AC
Start: 2016-06-26 — End: 2016-06-26
  Administered 2016-06-26: 1 g via INTRAMUSCULAR

## 2016-06-26 MED ORDER — AMBULATORY NON FORMULARY MEDICATION: 3 refills | Status: AC

## 2016-06-26 MED ORDER — NITROFURANTOIN MONOHYD MACRO 100 MG PO CAPS: 100.0000 mg | ORAL_CAPSULE | Freq: Two times a day (BID) | ORAL | 0 refills | Status: DC

## 2016-06-26 NOTE — Progress Notes (Signed)
   Subjective:    Patient ID: Brittney Gonzalez, female    DOB: 1961/07/28, 55 y.o.   MRN: [id number]  HPI  Pt presents to the clinic with main concern of painful urination, urine odor, frequency, and right lower abdomen pain for the last 2-3 days. Feels hot and cold. No energy. Hx of UTI's. She has a lot of lower pelvic pressure. She feels "like her bladder is going to fall out"   She needs refill on tylenol 3 for chronic midline low back pain with hx of musculoskeletal injuries. Per patient had xrays in 1998. She has been taking pain medication since she was 55 years old.    Review of Systems  All other systems reviewed and are negative.      Objective:   Physical Exam  Constitutional: She is oriented to person, place, and time. She appears well-developed and well-nourished.  HENT:  Head: Normocephalic and atraumatic.  Cardiovascular: Normal rate, regular rhythm and normal heart sounds.   Pulmonary/Chest: Effort normal and breath sounds normal.  Genitourinary:  Genitourinary Comments: Stage 1-2 cystocele.   Neurological: She is alert and oriented to person, place, and time.  Psychiatric: She has a normal mood and affect. Her behavior is normal.          Assessment & Plan:  UTI/right lower quadrant pain- .. Results for orders placed or performed in visit on 06/26/16  WET PREP FOR [place], YEAST, CLUE  Result Value Ref Range   Yeast Wet Prep HPF POC NONE SEEN NONE SEEN   Trich, Wet Prep NONE SEEN NONE SEEN   Clue Cells Wet Prep HPF POC MANY (A) NONE SEEN   WBC, Wet Prep HPF POC MANY (A) NONE SEEN  POCT urinalysis dipstick  Result Value Ref Range   Color, UA dark yellow    Clarity, UA turbid    Glucose, UA 250    Bilirubin, UA neg    Ketones, UA neg    Spec Grav, UA 1.020    Blood, UA trace-lysed    pH, UA 5.5    Protein, UA neg    Urobilinogen, UA 0.2    Nitrite, UA pos    Leukocytes, UA large (3+) (A) Negative   macrobid for 7 days. Sent culture.  Metronidazole  sent for BV.   Cystocele- appears to be grade 1-2. Will refer to gyn/urology.   Chronic midline low back pain with sciatica- refilled tylenol 3 for 2 months. Will attempt to get records and imaging.

## 2016-06-26 NOTE — Patient Instructions (Signed)
Take abx.  Will make referral to evaluate bladder prolapse.  Follow up in 2 months.  Call if not call with counseling.   Urinary Tract Infection Urinary tract infections (UTIs) can develop anywhere along your urinary tract. Your urinary tract is your body's drainage system for removing wastes and extra water. Your urinary tract includes two kidneys, two ureters, a bladder, and a urethra. Your kidneys are a pair of bean-shaped organs. Each kidney is about the size of your fist. They are located below your ribs, one on each side of your spine. CAUSES Infections are caused by microbes, which are microscopic organisms, including fungi, viruses, and bacteria. These organisms are so small that they can only be seen through a microscope. Bacteria are the microbes that most commonly cause UTIs. SYMPTOMS  Symptoms of UTIs may vary by age and gender of the patient and by the location of the infection. Symptoms in young women typically include a frequent and intense urge to urinate and a painful, burning feeling in the bladder or urethra during urination. Older women and men are more likely to be tired, shaky, and weak and have muscle aches and abdominal pain. A fever may mean the infection is in your kidneys. Other symptoms of a kidney infection include pain in your back or sides below the ribs, nausea, and vomiting. DIAGNOSIS To diagnose a UTI, your caregiver will ask you about your symptoms. Your caregiver will also ask you to provide a urine sample. The urine sample will be tested for bacteria and white blood cells. White blood cells are made by your body to help fight infection. TREATMENT  Typically, UTIs can be treated with medication. Because most UTIs are caused by a bacterial infection, they usually can be treated with the use of antibiotics. The choice of antibiotic and length of treatment depend on your symptoms and the type of bacteria causing your infection. HOME CARE INSTRUCTIONS  If you were  prescribed antibiotics, take them exactly as your caregiver instructs you. Finish the medication even if you feel better after you have only taken some of the medication.  Drink enough water and fluids to keep your urine clear or pale yellow.  Avoid caffeine, tea, and carbonated beverages. They tend to irritate your bladder.  Empty your bladder often. Avoid holding urine for long periods of time.  Empty your bladder before and after sexual intercourse.  After a bowel movement, women should cleanse from front to back. Use each tissue only once. SEEK MEDICAL CARE IF:   You have back pain.  You develop a fever.  Your symptoms do not begin to resolve within 3 days. SEEK IMMEDIATE MEDICAL CARE IF:   You have severe back pain or lower abdominal pain.  You develop chills.  You have nausea or vomiting.  You have continued burning or discomfort with urination. MAKE SURE YOU:   Understand these instructions.  Will watch your condition.  Will get help right away if you are not doing well or get worse.   This information is not intended to replace advice given to you by your health care provider. Make sure you discuss any questions you have with your health care provider.   Document Released: 08/13/2005 Document Revised: 07/25/2015 Document Reviewed: 12/12/2011 Elsevier Interactive Patient Education [employer].

## 2016-06-27 ENCOUNTER — Other Ambulatory Visit: Payer: Self-pay | Admitting: *Deleted

## 2016-06-27 LAB — WET PREP FOR TRICH, YEAST, CLUE
TRICH WET PREP: NONE SEEN
Yeast Wet Prep HPF POC: NONE SEEN

## 2016-06-27 MED ORDER — METRONIDAZOLE 500 MG PO TABS: 500.0000 mg | ORAL_TABLET | Freq: Two times a day (BID) | ORAL | 0 refills | Status: DC

## 2016-06-27 NOTE — Progress Notes (Unsigned)
Rx sent to pharmacy   

## 2016-06-30 ENCOUNTER — Telehealth: Payer: Self-pay | Admitting: Physician Assistant

## 2016-06-30 NOTE — Telephone Encounter (Signed)
I called patient about her referral to let her know where I was sending it and to let her know to call and schedule. While on the phone the patient requested I talk to her fiance Shawn.  He then stated that she needed a referral to a surgeon to have her bladder tact up due to it keeps causing her to have infections and he would like for this to be done as soon as possible. If you agree please place referral and I will send it out. - CF

## 2016-07-01 DIAGNOSIS — IMO0002 Reserved for concepts with insufficient information to code with codable children: Secondary | ICD-10-CM | POA: Insufficient documentation

## 2016-07-01 DIAGNOSIS — N898 Other specified noninflammatory disorders of vagina: Secondary | ICD-10-CM | POA: Insufficient documentation

## 2016-07-01 NOTE — Telephone Encounter (Signed)
Placed referral to urology.

## 2016-07-02 ENCOUNTER — Ambulatory Visit (INDEPENDENT_AMBULATORY_CARE_PROVIDER_SITE_OTHER): Payer: Medicare Other | Admitting: Osteopathic Medicine

## 2016-07-02 ENCOUNTER — Encounter: Payer: Self-pay | Admitting: Osteopathic Medicine

## 2016-07-02 VITALS — BP 114/66 | HR 83 | Temp 97.9°F | Ht 67.5 in

## 2016-07-02 DIAGNOSIS — I878 Other specified disorders of veins: Secondary | ICD-10-CM

## 2016-07-02 DIAGNOSIS — N390 Urinary tract infection, site not specified: Secondary | ICD-10-CM | POA: Diagnosis not present

## 2016-07-02 DIAGNOSIS — R319 Hematuria, unspecified: Secondary | ICD-10-CM

## 2016-07-02 DIAGNOSIS — R112 Nausea with vomiting, unspecified: Secondary | ICD-10-CM | POA: Diagnosis not present

## 2016-07-02 LAB — POCT URINALYSIS DIPSTICK
Bilirubin, UA: NEGATIVE
Blood, UA: NEGATIVE
KETONES UA: NEGATIVE
Nitrite, UA: NEGATIVE
PROTEIN UA: NEGATIVE
Spec Grav, UA: 1.015
Urobilinogen, UA: 0.2
pH, UA: 5.5

## 2016-07-02 MED ORDER — PROMETHAZINE HCL 25 MG/ML IJ SOLN: 12.5000 mg | Freq: Once | INTRAMUSCULAR | Status: DC

## 2016-07-02 MED ORDER — ONDANSETRON 8 MG PO TBDP: 8.0000 mg | ORAL_TABLET | Freq: Three times a day (TID) | ORAL | 0 refills | Status: DC | PRN

## 2016-07-02 MED ORDER — PROMETHAZINE HCL 25 MG/ML IJ SOLN
12.5000 mg | Freq: Once | INTRAMUSCULAR | Status: AC
  Administered 2016-07-02: 12.5 mg via INTRAVENOUS

## 2016-07-02 NOTE — Addendum Note (Signed)
Addended by: Huel Cote on: 07/02/2016 03:50 PM   Modules accepted: Orders

## 2016-07-02 NOTE — Progress Notes (Signed)
HPI: Brittney Gonzalez is a 55 y.o. female  who presents to [facility] today, 07/02/16,  for chief complaint of:  Chief Complaint  Patient presents with  . Urinary Tract Infection  . Nausea  . Emesis  . Diarrhea    Recent notes reviewed: saw PCP Jade 4 days ago - "Pt presents to the clinic with main concern of painful urination, urine odor, frequency, and right lower abdomen pain for the last 2-3 days. Feels hot and cold. No energy. Hx of UTI's. She has a lot of lower pelvic pressure. She feels "like her bladder is going to fall out"" - (+)cystocoele, referred to surgery, macrobid x7 days, culture per notes but I don't see results in the system ?was sent to lab?   Today complaining of persistent nausea and vomiting, unable to keep down food. Unable to ambulate without back pain (chronic). (+)subjective fever today, reports fever 102 2 days ago. N/V/D worse since on abx but is a chronic issue for her.  Previous Ucx  None from 06/26/16 05/13/16 (+)Ecoli - pansensitive 05/05/15 no growth 04/23/15 no growth 02/20/14 mult morphotypes    Past medical, surgical, social and family history reviewed: Past Medical History:  Diagnosis Date  . Anxiety   . Bipolar disorder ([place])   . Diabetes mellitus type II   . GERD (gastroesophageal reflux disease)   . Hyperlipemia   . Hypertension   . TIA (transient ischemic attack)    Past Surgical History:  Procedure Laterality Date  . ABDOMINAL HYSTERECTOMY    . APPENDECTOMY    . RIGHT OOPHORECTOMY     Social History  Substance Use Topics  . Smoking status: Never Smoker  . Smokeless tobacco: Never Used  . Alcohol use Yes     Comment: socially   Family History  Problem Relation Age of Onset  . Alcohol abuse Father   . Hypertension Father   . Diabetes Father   . Aneurysm Father   . COPD Father   . COPD Mother   . Colon cancer Mother      Current medication list and allergy/intolerance information  reviewed:   Current Outpatient Prescriptions  Medication Sig Dispense Refill  . acetaminophen-codeine (TYLENOL #4) 300-60 MG tablet Take 1 tablet by mouth every 8 (eight) hours as needed for moderate pain. Do not refill until 08/13/16. 60 tablet 0  . albuterol (PROVENTIL HFA;VENTOLIN HFA) 108 (90 Base) MCG/ACT inhaler Inhale 2 puffs into the lungs every 6 (six) hours as needed for wheezing or shortness of breath. 1 Inhaler 3  . alprazolam (XANAX) 2 MG tablet Take 2 mg by mouth 4 (four) times daily.    . AMBULATORY NON FORMULARY MEDICATION One touch test strips and lancets. 300 each 3  . budesonide-formoterol (SYMBICORT) 160-4.5 MCG/ACT inhaler Inhale 2 puffs into the lungs 2 (two) times daily. 1 Inhaler 4  . cetirizine (ZYRTEC) 10 MG tablet Take 1 tablet (10 mg total) by mouth daily. 30 tablet 11  . conjugated estrogens (PREMARIN) vaginal cream Place 1 Applicatorful vaginally daily. For 2 weeks then 3 times a week. 42.5 g 6  . diphenoxylate-atropine (LOMOTIL) 2.5-0.025 MG tablet One to 2 tablets by mouth 4 times a day as needed for diarrhea. 60 tablet 3  . fluticasone (FLONASE) 50 MCG/ACT nasal spray Place 2 sprays into both nostrils daily. 16 g 2  . furosemide (LASIX) 40 MG tablet Take 1 tablet (40 mg total) by mouth daily as needed. 30 tablet 3  .  glipiZIDE (GLUCOTROL) 10 MG tablet Take 1 tablet (10 mg total) by mouth 2 (two) times daily before a meal. 60 tablet 2  . magic mouthwash SOLN Take 5 mLs by mouth 3 (three) times daily as needed for mouth pain. 15 mL 0  . magic mouthwash w/lidocaine SOLN Take 10 mLs by mouth 4 (four) times daily. Swish for 2 minutes then spit out. 500 mL 1  . metFORMIN (GLUCOPHAGE) 500 MG tablet Take 2 tablets twice a day with meals. 120 tablet 2  . metroNIDAZOLE (FLAGYL) 500 MG tablet Take 1 tablet (500 mg total) by mouth 2 (two) times daily. 14 tablet 0  . nitrofurantoin, macrocrystal-monohydrate, (MACROBID) 100 MG capsule Take 1 capsule (100 mg total) by mouth 2 (two)  times daily. For 7 days. 14 capsule 0  . promethazine (PHENERGAN) 25 MG tablet Take 1 tablet (25 mg total) by mouth every 6 (six) hours as needed for nausea or vomiting. 15 tablet 1  . traZODone (DESYREL) 150 MG tablet Take 150 mg by mouth at bedtime.    . Vitamin D, Ergocalciferol, (DRISDOL) 50000 units CAPS capsule Take 1 capsule (50,000 Units total) by mouth every 7 (seven) days. 12 capsule 0   No current facility-administered medications for this visit.    Allergies  Allergen Reactions  . Celebrex [Celecoxib]     Rash/itch  . Ciprofloxacin   . Darvon   . Hydrocodone-Acetaminophen     Conflicting report from pt. As to whether she is allergic to this.  . Morphine Sulfate     REACTION: Rash  . Oxycodone-Acetaminophen     REACTION: Itch  . Propoxyphene Hcl     REACTION: GI Upset  . Propoxyphene N-Acetaminophen     REACTION: GI Upset  . Sulfonamide Derivatives     REACTION: Itch  . Tramadol Hcl     REACTION: GI Upset      Review of Systems:  Constitutional:  (+) subjective fever, no chills, (+) recent illness, No unintentional weight changes. (+) significant fatigue.   Cardiac: No  chest pain  Respiratory:  No  shortness of breath.  Gastrointestinal: No  abdominal pain, (+)nausea, (+)vomiting,  No  blood in stool, (+)diarrhea, No  constipation   Musculoskeletal: No new myalgia/arthralgia  Genitourinary: No  incontinence, No  abnormal genital bleeding, No abnormal genital discharge  Exam:  BP 114/66   Pulse 83   Temp 97.9 F (36.6 C) (Oral)   Ht 5' 7.5" (1.715 m)   Constitutional: VS see above. General Appearance: alert, well-developed, well-nourished, NAD  Eyes: Normal lids and conjunctive, non-icteric sclera  Ears, Nose, Mouth, Throat: MMM, Normal external inspection ears/nares/mouth/lips/gums.  Neck: No masses, trachea midline. No thyroid enlargement. No tenderness/mass appreciated. No lymphadenopathy  Respiratory: Normal respiratory effort. no wheeze, no  rhonchi, no rales  Cardiovascular: S1/S2 normal, no murmur, no rub/gallop auscultated. RRR. No lower extremity edema.  Gastrointestinal: Nontender, no masses.   Skin: warm, dry, intact. No rash/ulcer.   Psychiatric: Fair judgment/insight. Normal mood and affect. Oriented x3.    Results for orders placed or performed in visit on 07/02/16 (from the past 72 hour(s))  POCT Urinalysis Dipstick     Status: Abnormal   Collection Time: 07/02/16  3:36 PM  Result Value Ref Range   Color, UA yellow    Clarity, UA clear    Glucose, UA 250mg     Bilirubin, UA negative    Ketones, UA negative    Spec Grav, UA 1.015    Blood, UA negative  pH, UA 5.5    Protein, UA negative    Urobilinogen, UA 0.2    Nitrite, UA negative    Leukocytes, UA small (1+) (A) Negative    No results found.   ASSESSMENT/PLAN:   Patient with concern for urinary tract infection/possible pyelonephritis. Was in the office 4 days ago, urine was collected and was positive for WBCs, culture was for some reason not sent.  Today in the office, patient received IV Phenergan, 1 L normal saline, urine was collected by catheterization for urine culture.  Patient complains of persistent nausea despite treatment with Phenergan here in the office. Patient reports adverse reaction to Cipro where nausea/vomiting is an issue for her, so we are not really able to escalate antibiotics at home, and at any rate, the patient is concerned that she will not be able to keep these medicines down.  She does not ambulate independently, complaining of persistent nausea, I am of course concerned for sepsis due to UTI as well as patient inability to take treatment as an outpatient.  Offered her the option to trial Cipro and Zofran/Phenergan at home, patient says she doesn't think she'll be able to do this. At this point, I feel evaluation in the emergency room is warranted to rule out sepsis, consider admission if patient is totally unable to  complete outpatient therapy  Urinary tract infection with hematuria, site unspecified - Plan: Urine culture, Urinalysis, microscopic only, CBC with Differential/Platelet, BASIC METABOLIC PANEL WITH GFR, POCT Urinalysis Dipstick  Non-intractable vomiting with nausea, unspecified vomiting type - Plan: CBC with Differential/Platelet, BASIC METABOLIC PANEL WITH GFR, ondansetron (ZOFRAN-ODT) 8 MG disintegrating tablet, DISCONTINUED: promethazine (PHENERGAN) injection 12.5 mg     Visit summary with medication list and pertinent instructions was printed for patient to review. All questions at time of visit were answered - patient instructed to contact office with any additional concerns. ER/RTC precautions were reviewed with the patient. Follow-up plan: Return Follow-up with PCP, Luvenia Starch, once discharged from ER/hospital.  Note: Total time spent 40 minutes, greater than 50% of the visit was spent face-to-face counseling and coordinating care for the following: The primary encounter diagnosis was Urinary tract infection with hematuria, site unspecified. A diagnosis of Non-intractable vomiting with nausea, unspecified vomiting type was also pertinent to this visit.Marland Kitchen

## 2016-07-02 NOTE — Progress Notes (Addendum)
   Subjective:    I'm seeing this patient as a consultation for:  Dr. Emeterio Reeve  CC: Venous access  HPI: This is a pleasant 55 year old female with nausea and vomiting, she has very poor venous access and I'm called for further evaluation and definitive treatment regarding IV access.  Past medical history, Surgical history, Family history not pertinant except as noted below, Social history, Allergies, and medications have been entered into the medical record, reviewed, and no changes needed.   Review of Systems: No headache, visual changes, nausea, vomiting, diarrhea, constipation, dizziness, abdominal pain, skin rash, fevers, chills, night sweats, weight loss, swollen lymph nodes, body aches, joint swelling, muscle aches, chest pain, shortness of breath, mood changes, visual or auditory hallucinations.   Objective:   General: Well Developed, well nourished, and in no acute distress.  Neuro/Psych: Alert and oriented x3, extra-ocular muscles intact, able to move all 4 extremities, sensation grossly intact. Skin: Warm and dry, no rashes noted.  Respiratory: Not using accessory muscles, speaking in full sentences, trachea midline.  Cardiovascular: Pulses palpable, no extremity edema. Abdomen: Does not appear distended.  A 20-gauge angiocatheter was placed into the left cubital vein under ultrasound guidance, ultrasound guidance for interventional access to the cubital vein was medically necessary due to very poor venous access.  Impression and Recommendations:   This case required medical decision making of moderate complexity.  1. Poor venous access 20-gauge angiocatheter placed under ultrasound guidance, further fluid management and management of primary process per Dr. Sheppard Coil.

## 2016-07-03 LAB — PAIN MGMT, PROFILE 6 CONF W/O MM, U
6 ACETYLMORPHINE: NEGATIVE ng/mL (ref <10)
ALCOHOL METABOLITES: NEGATIVE ng/mL (ref <500)
ALPHAHYDROXYALPRAZOLAM: 2771 ng/mL — AB (ref <25)
ALPHAHYDROXYTRIAZOLAM: NEGATIVE ng/mL (ref <50)
AMINOCLONAZEPAM: NEGATIVE ng/mL (ref <25)
AMPHETAMINES: NEGATIVE ng/mL (ref <500)
Alphahydroxymidazolam: NEGATIVE ng/mL (ref <50)
BENZODIAZEPINES: POSITIVE ng/mL — AB (ref <100)
Barbiturates: NEGATIVE ng/mL (ref <300)
CODEINE: 3312 ng/mL — AB (ref <50)
Cocaine Metabolite: NEGATIVE ng/mL (ref <150)
Creatinine: 79.8 mg/dL (ref >=20.0)
HYDROMORPHONE: NEGATIVE ng/mL (ref <50)
HYDROXYETHYLFLURAZEPAM: NEGATIVE ng/mL (ref <50)
Hydrocodone: 225 ng/mL — ABNORMAL HIGH (ref <50)
LORAZEPAM: NEGATIVE ng/mL (ref <50)
MORPHINE: 664 ng/mL — AB (ref <50)
Marijuana Metabolite: 25 ng/mL — ABNORMAL HIGH (ref <5)
Marijuana Metabolite: POSITIVE ng/mL — AB (ref <20)
Methadone Metabolite: NEGATIVE ng/mL (ref <100)
NORDIAZEPAM: NEGATIVE ng/mL (ref <50)
Norhydrocodone: 275 ng/mL — ABNORMAL HIGH (ref <50)
OPIATES: POSITIVE ng/mL — AB (ref <100)
OXAZEPAM: NEGATIVE ng/mL (ref <50)
Oxidant: NEGATIVE ug/mL (ref <200)
Oxycodone: NEGATIVE ng/mL (ref <100)
Phencyclidine: NEGATIVE ng/mL (ref <25)
Please note:: 0
Temazepam: NEGATIVE ng/mL (ref <50)
pH: 6.56 (ref 4.5–9.0)

## 2016-07-04 ENCOUNTER — Encounter: Payer: Self-pay | Admitting: Physician Assistant

## 2016-07-04 DIAGNOSIS — F1290 Cannabis use, unspecified, uncomplicated: Secondary | ICD-10-CM | POA: Insufficient documentation

## 2016-07-18 ENCOUNTER — Other Ambulatory Visit: Payer: Self-pay | Admitting: Physician Assistant

## 2016-07-31 ENCOUNTER — Other Ambulatory Visit: Payer: Self-pay | Admitting: Family Medicine

## 2016-08-02 ENCOUNTER — Encounter: Payer: Self-pay | Admitting: Emergency Medicine

## 2016-08-02 ENCOUNTER — Emergency Department (INDEPENDENT_AMBULATORY_CARE_PROVIDER_SITE_OTHER)
Admission: EM | Admit: 2016-08-02 | Discharge: 2016-08-02 | Disposition: A | Discharge status: Home / Self Care | Payer: Medicare Other | Source: Home / Self Care | Attending: Family Medicine | Admitting: Family Medicine

## 2016-08-02 DIAGNOSIS — R300 Dysuria: Secondary | ICD-10-CM

## 2016-08-02 DIAGNOSIS — R112 Nausea with vomiting, unspecified: Secondary | ICD-10-CM

## 2016-08-02 MED ORDER — PROMETHAZINE HCL 25 MG PO TABS: 25.0000 mg | ORAL_TABLET | Freq: Four times a day (QID) | ORAL | 1 refills | Status: DC | PRN

## 2016-08-02 MED ORDER — CEFTRIAXONE SODIUM 500 MG IJ SOLR
500.0000 mg | Freq: Once | INTRAMUSCULAR | Status: AC
  Administered 2016-08-02: 500 mg via INTRAMUSCULAR

## 2016-08-02 MED ORDER — KETOROLAC TROMETHAMINE 30 MG/ML IJ SOLN
30.0000 mg | Freq: Once | INTRAMUSCULAR | Status: AC
  Administered 2016-08-02: 30 mg via INTRAMUSCULAR

## 2016-08-02 MED ORDER — KETOROLAC TROMETHAMINE 10 MG PO TABS: 10.0000 mg | ORAL_TABLET | Freq: Four times a day (QID) | ORAL | 1 refills | Status: DC | PRN

## 2016-08-02 MED ORDER — CEPHALEXIN 500 MG PO CAPS: 500.0000 mg | ORAL_CAPSULE | Freq: Two times a day (BID) | ORAL | 0 refills | Status: DC

## 2016-08-02 MED ORDER — PROMETHAZINE HCL 25 MG/ML IJ SOLN
25.0000 mg | Freq: Once | INTRAMUSCULAR | Status: AC
  Administered 2016-08-02: 25 mg via INTRAMUSCULAR

## 2016-08-02 NOTE — ED Provider Notes (Signed)
[name]    CSN: [id number] Arrival date & time: 08/02/16  1700  First Provider Contact:  First MD Initiated Contact with Patient 08/02/16 1824        History   Chief Complaint Chief Complaint  Patient presents with  . Urinary Tract Infection    HPI Brittney Gonzalez is a 55 y.o. female.   Patient complains of history of recurring UTI's, and states that she has an appointment with urologist on October 4.  She complains of several day history of dysuria, urgency, frequency, lower abdominal discomfort, nausea, and occasional vomiting.  No fevers, chills, and sweats.  She has specific requests:  Toradol for pain, an initial Rocephin injection, and Phenergan for nausea (she reports that Zofran is not very helpful)   The history is provided by the patient.  Dysuria  Pain quality:  Burning Pain severity:  Moderate Onset quality:  Sudden Duration:  2 days Timing:  Constant Chronicity:  Recurrent Recent urinary tract infections: yes   Relieved by:  Nothing Worsened by:  Nothing Urinary symptoms: frequent urination, hesitancy and incontinence   Urinary symptoms: no discolored urine, no foul-smelling urine and no hematuria   Associated symptoms: nausea and vomiting   Associated symptoms: no abdominal pain, no fever, no flank pain, no genital lesions and no vaginal discharge   Risk factors: recurrent urinary tract infections     Past Medical History:  Diagnosis Date  . Anxiety   . Bipolar disorder ([place])   . Diabetes mellitus type II   . GERD (gastroesophageal reflux disease)   . Hyperlipemia   . Hypertension   . TIA (transient ischemic attack)     Patient Active Problem List   Diagnosis Date Noted  . Marijuana use 07/04/2016  . Cystocele 07/01/2016  . Vaginal irritation 07/01/2016  . Decreased libido 05/14/2016  . PTSD (post-traumatic stress disorder) 05/14/2016  . Fatty liver disease, nonalcoholic [id number]  . Vitamin D deficiency 03/12/2016  .  Elevated liver enzymes 03/12/2016  . Elevated TSH 03/12/2016  . Loose stools 03/12/2016  . Controlled type 2 diabetes mellitus without complication, without long-term current use of insulin ([place]) 03/11/2016  . Bipolar 1 disorder, mixed, moderate ([place]) 03/11/2016  . COPD exacerbation ([place]) 03/11/2016  . Insomnia 03/11/2016  . Hyperlipidemia associated with type 2 diabetes mellitus ([place]) 03/11/2016  . No energy 03/11/2016  . Right shoulder pain 03/11/2016  . Atypical chest pain 03/11/2016  . Bipolar 1 disorder, mixed ([place]) 10/03/2011    Class: Chronic  . ANKLE SPRAIN, RIGHT 09/23/2010  . COUGH 06/27/2010  . ALLERGIC RHINITIS 06/19/2010  . G E R D 06/19/2010  . LUMBAR STRAIN, ACUTE 05/25/2010  . MYALGIA 03/28/2010  . DIABETES MELLITUS, TYPE II 03/11/2010  . HYPERLIPIDEMIA 03/11/2010  . Musculoskeletal disease 03/11/2010    Past Surgical History:  Procedure Laterality Date  . ABDOMINAL HYSTERECTOMY    . APPENDECTOMY    . RIGHT OOPHORECTOMY      OB History    No data available       Home Medications    Prior to Admission medications   Medication Sig Start Date End Date Taking? Authorizing Provider  acetaminophen-codeine (TYLENOL #4) 300-60 MG tablet Take 1 tablet by mouth every 8 (eight) hours as needed for moderate pain. Do not refill until 08/13/16. 06/26/16   Donella Stade, PA-C  albuterol (PROVENTIL HFA;VENTOLIN HFA) 108 (90 Base) MCG/ACT inhaler Inhale 2 puffs into the lungs every 6 (six) hours as needed for wheezing or  shortness of breath. 03/11/16   Jade L Breeback, PA-C  alprazolam (XANAX) 2 MG tablet Take 2 mg by mouth 4 (four) times daily.    Historical Provider, MD  AMBULATORY NON FORMULARY MEDICATION One touch test strips and lancets. 06/26/16   Jade L Breeback, PA-C  budesonide-formoterol (SYMBICORT) 160-4.5 MCG/ACT inhaler Inhale 2 puffs into the lungs 2 (two) times daily. 03/11/16   Jade L Breeback, PA-C  cephALEXin (KEFLEX) 500 MG capsule Take 1 capsule (500 mg  total) by mouth 2 (two) times daily. 08/02/16   Kandra Nicolas, MD  cetirizine (ZYRTEC) 10 MG tablet Take 1 tablet (10 mg total) by mouth daily. 03/11/16   Jade L Breeback, PA-C  conjugated estrogens (PREMARIN) vaginal cream Place 1 Applicatorful vaginally daily. For 2 weeks then 3 times a week. 05/13/16   Jade L Breeback, PA-C  diphenoxylate-atropine (LOMOTIL) 2.5-0.025 MG tablet One to 2 tablets by mouth 4 times a day as needed for diarrhea. 05/13/16   Jade L Breeback, PA-C  fluticasone (FLONASE) 50 MCG/ACT nasal spray Place 2 sprays into both nostrils daily. 03/11/16   Jade L Breeback, PA-C  furosemide (LASIX) 40 MG tablet TAKE 1 TABLET BY MOUTH DAILY AS NEEDED 07/18/16   Jade L Breeback, PA-C  glipiZIDE (GLUCOTROL) 10 MG tablet Take 1 tablet (10 mg total) by mouth 2 (two) times daily before a meal. 05/13/16   Jade L Breeback, PA-C  ketorolac (TORADOL) 10 MG tablet Take 1 tablet (10 mg total) by mouth every 6 (six) hours as needed. 08/02/16   Kandra Nicolas, MD  magic mouthwash SOLN Take 5 mLs by mouth 3 (three) times daily as needed for mouth pain. 04/11/16   Donella Stade, PA-C  magic mouthwash w/lidocaine SOLN Take 10 mLs by mouth 4 (four) times daily. Swish for 2 minutes then spit out. 04/11/16   Donella Stade, PA-C  metFORMIN (GLUCOPHAGE) 500 MG tablet TAKE 2 TABLETS BY MOUTH TWICE DAILY WITH MEALS 07/31/16   Jade L Breeback, PA-C  ondansetron (ZOFRAN-ODT) 8 MG disintegrating tablet Take 1 tablet (8 mg total) by mouth every 8 (eight) hours as needed for nausea. 07/02/16   Emeterio Reeve, DO  promethazine (PHENERGAN) 25 MG tablet Take 1 tablet (25 mg total) by mouth every 6 (six) hours as needed for nausea or vomiting. 08/02/16   Kandra Nicolas, MD  traZODone (DESYREL) 150 MG tablet Take 150 mg by mouth at bedtime.    Historical Provider, MD  Vitamin D, Ergocalciferol, (DRISDOL) 50000 units CAPS capsule Take 1 capsule (50,000 Units total) by mouth every 7 (seven) days. 03/12/16   Donella Stade,  PA-C    Family History Family History  Problem Relation Age of Onset  . Alcohol abuse Father   . Hypertension Father   . Diabetes Father   . Aneurysm Father   . COPD Father   . COPD Mother   . Colon cancer Mother     Social History Social History  Substance Use Topics  . Smoking status: Never Smoker  . Smokeless tobacco: Never Used  . Alcohol use Yes     Comment: socially     Allergies   Celebrex [celecoxib]; Ciprofloxacin; Darvon; Hydrocodone-acetaminophen; Morphine sulfate; Oxycodone-acetaminophen; Propoxyphene hcl; Propoxyphene n-acetaminophen; Sulfonamide derivatives; and Tramadol hcl   Review of Systems Review of Systems  Constitutional: Negative for fever.  Gastrointestinal: Positive for nausea and vomiting. Negative for abdominal pain.  Genitourinary: Positive for dysuria. Negative for flank pain and vaginal discharge.  All other  systems reviewed and are negative.    Physical Exam Triage Vital Signs ED Triage Vitals  Enc Vitals Group     BP 08/02/16 1743 127/68     Pulse Rate 08/02/16 1743 98     Resp --      Temp 08/02/16 1743 98 F (36.7 C)     Temp Source 08/02/16 1743 Oral     SpO2 08/02/16 1743 95 %     Weight 08/02/16 1743 144 lb (65.3 kg)     Height 08/02/16 1743 5\' 8"  (1.727 m)     Head Circumference --      Peak Flow --      Pain Score 08/02/16 1746 5     Pain Loc --      Pain Edu? --      Excl. in [place]? --    No data found.   Updated Vital Signs BP 127/68 (BP Location: Left Arm)   Pulse 98   Temp 98 F (36.7 C) (Oral)   Ht 5\' 8"  (1.727 m)   Wt 144 lb (65.3 kg)   SpO2 95%   BMI 21.90 kg/m   Visual Acuity Right Eye Distance:   Left Eye Distance:   Bilateral Distance:    Right Eye Near:   Left Eye Near:    Bilateral Near:     Physical Exam Nursing notes and Vital Signs reviewed. Appearance:  Patient appears stated age, and in no acute distress.    Eyes:  Pupils are equal, round, and reactive to light and accomodation.   Extraocular movement is intact.  Conjunctivae are not inflamed   Pharynx:  Normal; moist mucous membranes  Neck:  Supple.  No adenopathy Lungs:  Clear to auscultation.  Breath sounds are equal.  Moving air well. Heart:  Regular rate and rhythm without murmurs, rubs, or gallops.  Abdomen:   Mild suprapubic tenderness without masses or hepatosplenomegaly.  Bowel sounds are present.  No CVA or flank tenderness.  Extremities:  No edema.  Skin:  No rash present.     UC Treatments / Results  Labs (all labs ordered are listed, but only abnormal results are displayed) Labs Reviewed  URINE CULTURE    EKG  EKG Interpretation None       Radiology No results found.  Procedures Procedures (including critical care time)  Medications Ordered in UC Medications  cefTRIAXone (ROCEPHIN) injection 500 mg (500 mg Intramuscular Given 08/02/16 1859)  promethazine (PHENERGAN) injection 25 mg (25 mg Intramuscular Given 08/02/16 1859)  ketorolac (TORADOL) 30 MG/ML injection 30 mg (30 mg Intramuscular Given 08/02/16 1900)     Initial Impression / Assessment and Plan / UC Course  I have reviewed the triage vital signs and the nursing notes.  Pertinent labs & imaging results that were available during my care of the patient were reviewed by me and considered in my medical decision making (see chart for details).  Clinical Course  Urine culture pending. At patient's request, administered Rocephin 500mg  IM, begin oral Keflex 500mg  BID tomorrow. Phenergan 25mg  IM; Rx given for 25mg  PO Toradol 30gm IM; Rx for 10mg  Q6hr prn Increase fluid intake. If symptoms become significantly worse during the night or over the weekend, proceed to the local emergency room.  Followup with PCP     Final Clinical Impressions(s) / UC Diagnoses   Final diagnoses:  Dysuria  Nausea and vomiting, vomiting of unspecified type    New Prescriptions New Prescriptions   CEPHALEXIN (KEFLEX) 500 MG CAPSULE  Take 1  capsule (500 mg total) by mouth 2 (two) times daily.   KETOROLAC (TORADOL) 10 MG TABLET    Take 1 tablet (10 mg total) by mouth every 6 (six) hours as needed.     Kandra Nicolas, MD 08/13/16 2223

## 2016-08-02 NOTE — ED Triage Notes (Signed)
Pt c/o UTI sxs has history of recurrent infections, vomitting, dysuria, urgency

## 2016-08-02 NOTE — Discharge Instructions (Signed)
Increase fluid intake. °If symptoms become significantly worse during the night or over the weekend, proceed to the local emergency room.  °

## 2016-08-04 ENCOUNTER — Telehealth: Payer: Self-pay | Admitting: Emergency Medicine

## 2016-08-04 LAB — URINE CULTURE

## 2016-08-04 MED ORDER — ONDANSETRON HCL 4 MG PO TABS: 4.0000 mg | ORAL_TABLET | Freq: Four times a day (QID) | ORAL | 0 refills | Status: DC

## 2016-08-04 NOTE — Telephone Encounter (Signed)
Pt requested refill of zofran be sent.  Please let pt know zofran has been sent to her pharmacy. If still not feeling better, please have her follow up with primary care. If unable to keep down fluids, pt should go to emergency department.

## 2016-08-05 ENCOUNTER — Telehealth: Payer: Self-pay | Admitting: *Deleted

## 2016-08-05 NOTE — Telephone Encounter (Signed)
LMOM d/t UCX ensure to complete antibiotic.

## 2016-08-08 ENCOUNTER — Ambulatory Visit: Payer: Self-pay | Admitting: Physician Assistant

## 2016-09-01 ENCOUNTER — Ambulatory Visit (INDEPENDENT_AMBULATORY_CARE_PROVIDER_SITE_OTHER): Payer: Medicare Other | Admitting: Osteopathic Medicine

## 2016-09-01 VITALS — BP 110/77 | HR 99 | Wt 136.0 lb

## 2016-09-01 DIAGNOSIS — J028 Acute pharyngitis due to other specified organisms: Secondary | ICD-10-CM | POA: Diagnosis not present

## 2016-09-01 DIAGNOSIS — IMO0002 Reserved for concepts with insufficient information to code with codable children: Secondary | ICD-10-CM

## 2016-09-01 DIAGNOSIS — E1165 Type 2 diabetes mellitus with hyperglycemia: Secondary | ICD-10-CM | POA: Diagnosis not present

## 2016-09-01 DIAGNOSIS — B9789 Other viral agents as the cause of diseases classified elsewhere: Secondary | ICD-10-CM

## 2016-09-01 DIAGNOSIS — N390 Urinary tract infection, site not specified: Secondary | ICD-10-CM

## 2016-09-01 DIAGNOSIS — J029 Acute pharyngitis, unspecified: Secondary | ICD-10-CM

## 2016-09-01 DIAGNOSIS — E118 Type 2 diabetes mellitus with unspecified complications: Secondary | ICD-10-CM

## 2016-09-01 DIAGNOSIS — M940 Chondrocostal junction syndrome [Tietze]: Secondary | ICD-10-CM

## 2016-09-01 DIAGNOSIS — R112 Nausea with vomiting, unspecified: Secondary | ICD-10-CM | POA: Diagnosis not present

## 2016-09-01 LAB — POCT URINALYSIS DIPSTICK
BILIRUBIN UA: NEGATIVE
GLUCOSE UA: NEGATIVE
Ketones, UA: NEGATIVE
NITRITE UA: POSITIVE
Protein, UA: 100
Spec Grav, UA: >=1.03
UROBILINOGEN UA: 0.2
pH, UA: 6

## 2016-09-01 LAB — POCT GLYCOSYLATED HEMOGLOBIN (HGB A1C): HEMOGLOBIN A1C: 6.8

## 2016-09-01 MED ORDER — CEPHALEXIN 500 MG PO CAPS: 500.0000 mg | ORAL_CAPSULE | Freq: Two times a day (BID) | ORAL | 0 refills | Status: DC

## 2016-09-01 MED ORDER — PROMETHAZINE HCL 25 MG PO TABS: 25.0000 mg | ORAL_TABLET | Freq: Four times a day (QID) | ORAL | 1 refills | Status: DC | PRN

## 2016-09-01 MED ORDER — AMOXICILLIN-POT CLAVULANATE 875-125 MG PO TABS: 1.0000 | ORAL_TABLET | Freq: Two times a day (BID) | ORAL | 0 refills | Status: DC

## 2016-09-01 MED ORDER — PROMETHAZINE HCL 25 MG/ML IJ SOLN
25.0000 mg | Freq: Once | INTRAMUSCULAR | Status: AC
  Administered 2016-09-01: 25 mg via INTRAMUSCULAR

## 2016-09-01 MED ORDER — KETOROLAC TROMETHAMINE 60 MG/2ML IM SOLN
60.0000 mg | Freq: Once | INTRAMUSCULAR | Status: AC
  Administered 2016-09-01: 60 mg via INTRAMUSCULAR

## 2016-09-01 NOTE — Progress Notes (Signed)
HPI: Brittney Gonzalez is a 55 y.o. female  who presents to [facility] today, 09/01/16,  for chief complaint of:  Chief Complaint  Patient presents with  . Sore Throat    C/o strep throat "a lot" with redness at the back of the throat. Has been able to keep down bread and crackers, ginger ale, water and 7up. Reports associated sinus congestion. Having some episodes of nausea and vomiting as well, difficulty keeping food down. No bloody/ileus/coffee grounds emesis.  Patient would also like checked for UTI, reports following with urology for recurrent UTI but they have not started any antibiotics for her, states that she was recently seen in urgent care and prescribed Augmentin which worked wonders for her, however based on the most recent note from urgent care she was given Keflex though urine culture did show sensitivity to Augmentin.  Diabetes: Patient states that blood sugars have been high as the 400s in this acute illness. No recent A1c on file, negative ketones in the urine.  Chronic pain: Patient requests refill of Tylenol No. 4. [place] controlled substance database was reviewed, most recently filled on 08/13/2016, patient is just "requesting my prescription for October"    Past medical, surgical, social and family history reviewed: Past Medical History:  Diagnosis Date  . Anxiety   . Bipolar disorder ([place])   . Diabetes mellitus type II   . GERD (gastroesophageal reflux disease)   . Hyperlipemia   . Hypertension   . TIA (transient ischemic attack)    Past Surgical History:  Procedure Laterality Date  . ABDOMINAL HYSTERECTOMY    . APPENDECTOMY    . RIGHT OOPHORECTOMY     Social History  Substance Use Topics  . Smoking status: Never Smoker  . Smokeless tobacco: Never Used  . Alcohol use Yes     Comment: socially   Family History  Problem Relation Age of Onset  . Alcohol abuse Father   . Hypertension Father   . Diabetes  Father   . Aneurysm Father   . COPD Father   . COPD Mother   . Colon cancer Mother      Current medication list and allergy/intolerance information reviewed:   Current Outpatient Prescriptions  Medication Sig Dispense Refill  . acetaminophen-codeine (TYLENOL #4) 300-60 MG tablet Take 1 tablet by mouth every 8 (eight) hours as needed for moderate pain. Do not refill until 08/13/16. 60 tablet 0  . albuterol (PROVENTIL HFA;VENTOLIN HFA) 108 (90 Base) MCG/ACT inhaler Inhale 2 puffs into the lungs every 6 (six) hours as needed for wheezing or shortness of breath. 1 Inhaler 3  . alprazolam (XANAX) 2 MG tablet Take 2 mg by mouth 4 (four) times daily.    . AMBULATORY NON FORMULARY MEDICATION One touch test strips and lancets. 300 each 3  . budesonide-formoterol (SYMBICORT) 160-4.5 MCG/ACT inhaler Inhale 2 puffs into the lungs 2 (two) times daily. 1 Inhaler 4  . cephALEXin (KEFLEX) 500 MG capsule Take 1 capsule (500 mg total) by mouth 2 (two) times daily. 14 capsule 0  . cetirizine (ZYRTEC) 10 MG tablet Take 1 tablet (10 mg total) by mouth daily. 30 tablet 11  . conjugated estrogens (PREMARIN) vaginal cream Place 1 Applicatorful vaginally daily. For 2 weeks then 3 times a week. 42.5 g 6  . diphenoxylate-atropine (LOMOTIL) 2.5-0.025 MG tablet One to 2 tablets by mouth 4 times a day as needed for diarrhea. 60 tablet 3  . fluticasone (FLONASE) 50 MCG/ACT nasal  spray Place 2 sprays into both nostrils daily. 16 g 2  . furosemide (LASIX) 40 MG tablet TAKE 1 TABLET BY MOUTH DAILY AS NEEDED 30 tablet 0  . glipiZIDE (GLUCOTROL) 10 MG tablet Take 1 tablet (10 mg total) by mouth 2 (two) times daily before a meal. 60 tablet 2  . ketorolac (TORADOL) 10 MG tablet Take 1 tablet (10 mg total) by mouth every 6 (six) hours as needed. 20 tablet 1  . magic mouthwash SOLN Take 5 mLs by mouth 3 (three) times daily as needed for mouth pain. 15 mL 0  . magic mouthwash w/lidocaine SOLN Take 10 mLs by mouth 4 (four) times  daily. Swish for 2 minutes then spit out. 500 mL 1  . metFORMIN (GLUCOPHAGE) 500 MG tablet TAKE 2 TABLETS BY MOUTH TWICE DAILY WITH MEALS 120 tablet 0  . ondansetron (ZOFRAN) 4 MG tablet Take 1 tablet (4 mg total) by mouth every 6 (six) hours. 12 tablet 0  . ondansetron (ZOFRAN-ODT) 8 MG disintegrating tablet Take 1 tablet (8 mg total) by mouth every 8 (eight) hours as needed for nausea. 20 tablet 0  . promethazine (PHENERGAN) 25 MG tablet Take 1 tablet (25 mg total) by mouth every 6 (six) hours as needed for nausea or vomiting. 20 tablet 1  . traZODone (DESYREL) 150 MG tablet Take 150 mg by mouth at bedtime.    . Vitamin D, Ergocalciferol, (DRISDOL) 50000 units CAPS capsule Take 1 capsule (50,000 Units total) by mouth every 7 (seven) days. 12 capsule 0   No current facility-administered medications for this visit.    Allergies  Allergen Reactions  . Celebrex [Celecoxib]     Rash/itch  . Ciprofloxacin   . Darvon   . Hydrocodone-Acetaminophen     Conflicting report from pt. As to whether she is allergic to this.  . Morphine Sulfate     REACTION: Rash  . Oxycodone-Acetaminophen     REACTION: Itch  . Propoxyphene Hcl     REACTION: GI Upset  . Propoxyphene N-Acetaminophen     REACTION: GI Upset  . Sulfonamide Derivatives     REACTION: Itch  . Tramadol Hcl     REACTION: GI Upset      Review of Systems:  Constitutional:  No  fever, no chills, +recent illness, No unintentional weight changes. +significant fatigue.   HEENT: No  headache, no vision change, no hearing change, +sore throat, +sinus pressure  Cardiac: +chest pain, No  pressure, No palpitations, No  Orthopnea  Gastrointestinal: No  abdominal pain, +nausea, +vomiting,  No  blood in stool, No  diarrhea, No  constipation   Musculoskeletal: No new myalgia/arthralgia  Genitourinary: No  incontinence, No  abnormal genital bleeding, No abnormal genital discharge, + dysuria  Skin: No  Rash, No other wounds/concerning  lesions  Endocrine:  No polyuria/polydipsia/polyphagia   Neurologic: No  weakness, No  dizziness  Exam:  BP 110/77   Pulse 99   Wt 136 lb (61.7 kg)   BMI 20.68 kg/m   Constitutional: VS see above. General Appearance: alert, well-developed, well-nourished, NAD  Eyes: Normal lids and conjunctive, non-icteric sclera  Ears, Nose, Mouth, Throat: MMM, Normal external inspection ears/nares/mouth/lips/gums. TM normal bilaterally. Pharynx/tonsils +erythema, no exudate. Nasal mucosa normal.   Neck: No masses, trachea midline. No thyroid enlargement. No tenderness/mass appreciated. No lymphadenopathy  Respiratory: Normal respiratory effort. no wheeze, no rhonchi, no rales  Cardiovascular: S1/S2 normal, no murmur, no rub/gallop auscultated. RRR. No lower extremity edema. +tendernss to chest wall  on palpation  Gastrointestinal: Nontender, no masses. No hepatomegaly, no splenomegaly. No hernia appreciated. Bowel sounds normal. Rectal exam deferred.   Musculoskeletal: Gait normal.   Neurological: Normal balance/coordination. No tremor.   Skin: warm, dry, intact. No rash/ulcer.    Results for orders placed or performed in visit on 09/01/16 (from the past 72 hour(s))  POCT Urinalysis Dipstick     Status: Abnormal   Collection Time: 09/01/16  4:37 PM  Result Value Ref Range   Color, UA brown    Clarity, UA cloudy    Glucose, UA negative    Bilirubin, UA negative    Ketones, UA negative    Spec Grav, UA >=1.030    Blood, UA large    pH, UA 6.0    Protein, UA 100    Urobilinogen, UA 0.2    Nitrite, UA positive    Leukocytes, UA large (3+) (A) Negative  POCT HgB A1C     Status: None   Collection Time: 09/01/16  4:45 PM  Result Value Ref Range   Hemoglobin A1C 6.8     ASSESSMENT/PLAN: Labs on file for CBC and CMP, patient did not get these done at last visit, need to get these done. We'll have nurse call, I don't believe a printed this on visit summary for the patient to do.  Particularly given her concerns about hyperglycemia on home monitor and nausea/vomiting, A1c indicates overall good control but if home glucometer is accurate then sugars in the 400s are obviously a problem. Also consideration this may be due to acute illness reaction. I declined to refill Tylenol No. 4, patient has follow-up appointment with PCP she can address this issue at that visit. I'm okay to prescribe Augmentin for UTI based on most recent culture though I'm not sure where patient is under the impression that this was prescribed recently for her she seems to think that this is the only medication that will help her symptoms. The patient absolutely needs to have follow-up with urology.  Non-intractable vomiting with nausea, unspecified vomiting type - Acute illness, need labs, low suspicion for DM2 issue UA neg for ketones but caution given pt hx elevated Glc at home - Plan: ketorolac (TORADOL) injection 60 mg, promethazine (PHENERGAN) injection 25 mg  Frequent UTI - NEED FOLLOWUP WITH UROLOGY - Plan: POCT Urinalysis Dipstick, Urine Culture, CANCELED: Urine culture  Uncontrolled type 2 diabetes mellitus with complication, without long-term current use of insulin ([facility]) - good control based on A1C and most recent Glc checks - Plan: POCT HgB A1C  Acute viral pharyngitis - canbe contributing to N/V, malaise, costochondritis. Supportive care advised.   Costochondritis   Patient Instructions  We'll call you with results of urine culture, may need to change antibiotics based on this but we have prescribed Augmentin for you today. Please be sure that you are following up with urology, please have them send [place] any updates tear plan of care.  Please schedule follow-up with [facility] to address diabetes care and other chronic medical problems and maintenance of your controlled substance prescriptions.  If your symptoms worsen, if you're unable to keep down any food, please proceed to the emergency  department    Visit summary with medication list and pertinent instructions was printed for patient to review. All questions at time of visit were answered - patient instructed to contact office with any additional concerns. ER/RTC precautions were reviewed with the patient. Follow-up plan: Return if symptoms worsen or fail to improve.   Total  time spent 40 minutes, greater than 50% of the visit was face to face counseling and coordinating care for diagnosis of The primary encounter diagnosis was Non-intractable vomiting with nausea, unspecified vomiting type. Diagnoses of Frequent UTI, Uncontrolled type 2 diabetes mellitus with complication, without long-term current use of insulin ([place]), Acute viral pharyngitis, and Costochondritis were also pertinent to this visit. Marland Kitchen

## 2016-09-01 NOTE — Patient Instructions (Signed)
We'll call you with results of urine culture, may need to change antibiotics based on this but we have prescribed Augmentin for you today. Please be sure that you are following up with urology, please have them send [place] any updates tear plan of care.  Please schedule follow-up with Brittney Gonzalez to address diabetes care and other chronic medical problems and maintenance of your controlled substance prescriptions.  If your symptoms worsen, if you're unable to keep down any food, please proceed to the emergency department

## 2016-09-02 ENCOUNTER — Telehealth: Payer: Self-pay

## 2016-09-02 DIAGNOSIS — R112 Nausea with vomiting, unspecified: Secondary | ICD-10-CM

## 2016-09-02 MED ORDER — ONDANSETRON 8 MG PO TBDP: 8.0000 mg | ORAL_TABLET | Freq: Three times a day (TID) | ORAL | 3 refills | Status: DC | PRN

## 2016-09-02 NOTE — Telephone Encounter (Signed)
FYI  Joen Laura called and left a message stating we are not to give phenergan to [place]. He states she overdosed in the past with phenergan.

## 2016-09-02 NOTE — Telephone Encounter (Signed)
Okay. Can cancel Phenergan. I sent in Zofran

## 2016-09-04 ENCOUNTER — Ambulatory Visit: Payer: Self-pay | Admitting: Osteopathic Medicine

## 2016-09-04 LAB — URINE CULTURE

## 2016-09-09 ENCOUNTER — Encounter: Payer: Self-pay | Admitting: Physician Assistant

## 2016-09-09 ENCOUNTER — Ambulatory Visit (INDEPENDENT_AMBULATORY_CARE_PROVIDER_SITE_OTHER): Payer: Medicare Other | Admitting: Physician Assistant

## 2016-09-09 VITALS — BP 81/57 | HR 81 | Ht 68.0 in

## 2016-09-09 DIAGNOSIS — M545 Low back pain, unspecified: Secondary | ICD-10-CM

## 2016-09-09 DIAGNOSIS — R809 Proteinuria, unspecified: Secondary | ICD-10-CM

## 2016-09-09 DIAGNOSIS — R110 Nausea: Secondary | ICD-10-CM | POA: Diagnosis not present

## 2016-09-09 DIAGNOSIS — N898 Other specified noninflammatory disorders of vagina: Secondary | ICD-10-CM

## 2016-09-09 DIAGNOSIS — R300 Dysuria: Secondary | ICD-10-CM | POA: Diagnosis not present

## 2016-09-09 LAB — WET PREP FOR TRICH, YEAST, CLUE
CLUE CELLS WET PREP: NONE SEEN
Trich, Wet Prep: NONE SEEN
Yeast Wet Prep HPF POC: NONE SEEN

## 2016-09-09 LAB — POCT URINALYSIS DIPSTICK
BILIRUBIN UA: NEGATIVE
Blood, UA: NEGATIVE
GLUCOSE UA: NEGATIVE
KETONES UA: NEGATIVE
LEUKOCYTES UA: NEGATIVE
Nitrite, UA: NEGATIVE
PH UA: 5
Spec Grav, UA: 1.025
Urobilinogen, UA: 0.2

## 2016-09-09 MED ORDER — PROMETHAZINE HCL 25 MG/ML IJ SOLN
25.0000 mg | Freq: Once | INTRAMUSCULAR | Status: AC
  Administered 2016-09-09: 25 mg via INTRAMUSCULAR

## 2016-09-09 MED ORDER — FLUCONAZOLE 150 MG PO TABS: 150.0000 mg | ORAL_TABLET | Freq: Once | ORAL | 0 refills | Status: AC

## 2016-09-09 MED ORDER — KETOROLAC TROMETHAMINE 10 MG PO TABS: 10.0000 mg | ORAL_TABLET | Freq: Three times a day (TID) | ORAL | 2 refills | Status: DC | PRN

## 2016-09-09 MED ORDER — KETOROLAC TROMETHAMINE 30 MG/ML IJ SOLN
30.0000 mg | Freq: Once | INTRAMUSCULAR | Status: AC
  Administered 2016-09-09: 30 mg via INTRAMUSCULAR

## 2016-09-10 ENCOUNTER — Encounter: Payer: Self-pay | Admitting: Physician Assistant

## 2016-09-10 ENCOUNTER — Telehealth: Payer: Self-pay

## 2016-09-10 NOTE — Telephone Encounter (Signed)
Pt don't seem to be in a normal state of mind.  Pt reports that her insurance will not cover toradol. Please advise.

## 2016-09-10 NOTE — Telephone Encounter (Signed)
Have you tried diclofenac twice a day?

## 2016-09-10 NOTE — Progress Notes (Signed)
   Subjective:    Patient ID: Brittney Gonzalez, female    DOB: 1961/02/19, 55 y.o.   MRN: [id number]  HPI Pt is a 55 yo female who presents to the with persistent dysuria and vaginal irritation. She has appt with urologist tomorrow. She feels like her external vagina itches more than hurts. She has not noticed any discharge. She does feel nauseated. No fever, chills, or body aches. She was treated for a UTI on 10/16. She just finished macrobid. No flank pain.   She tested positive for marijuana on a UDS. I am not going to give codiene productions. She asked for toradol. She feels like that helps the most.    Review of Systems  Constitutional: Negative.   Genitourinary: Positive for dysuria, frequency, hematuria, pelvic pain and urgency. Negative for difficulty urinating, flank pain, vaginal bleeding and vaginal discharge.       Objective:   Physical Exam  Constitutional: She is oriented to person, place, and time. She appears well-developed and well-nourished.  HENT:  Head: Normocephalic and atraumatic.  Cardiovascular: Normal rate, regular rhythm and normal heart sounds.   Pulmonary/Chest: Effort normal and breath sounds normal.  No CVA tenderness.   Abdominal: Soft. Bowel sounds are normal. She exhibits no distension and no mass. There is no tenderness. There is no rebound and no guarding.  Neurological: She is oriented to person, place, and time.  Psychiatric:  Flat affect.           Assessment & Plan:  Marland KitchenMarland KitchenDiagnoses and all orders for this visit:  Dysuria -     POCT urinalysis dipstick -     Urine Culture -     ketorolac (TORADOL) 30 MG/ML injection 30 mg; Inject 1 mL (30 mg total) into the muscle once.  Vaginal irritation -     fluconazole (DIFLUCAN) 150 MG tablet; Take 1 tablet (150 mg total) by mouth once. Repeat in 48-72 hours if symptoms persist. -     WET PREP FOR TRICH, YEAST, CLUE  Proteinuria, unspecified type -     Urine Culture  Nausea without  vomiting -     promethazine (PHENERGAN) injection 25 mg; Inject 1 mL (25 mg total) into the muscle once.  Low back pain at multiple sites -     ketorolac (TORADOL) 10 MG tablet; Take 1 tablet (10 mg total) by mouth every 8 (eight) hours as needed.   .. Results for orders placed or performed in visit on 09/09/16  WET PREP FOR [place], YEAST, CLUE  Result Value Ref Range   Yeast Wet Prep HPF POC NONE SEEN NONE SEEN   Trich, Wet Prep NONE SEEN NONE SEEN   Clue Cells Wet Prep HPF POC NONE SEEN NONE SEEN   WBC, Wet Prep HPF POC FEW NONE SEEN  POCT urinalysis dipstick  Result Value Ref Range   Color, UA orange    Clarity, UA clear    Glucose, UA neg    Bilirubin, UA neg    Ketones, UA neg    Spec Grav, UA 1.025    Blood, UA neg    pH, UA 5.0    Protein, UA trace    Urobilinogen, UA 0.2    Nitrite, UA neg    Leukocytes, UA Negative Negative   No signs of yeast infection. Will send topical steroid to help with vaginal itching.  Urine dipstick looks much better than 10/19 will culture.

## 2016-09-10 NOTE — Telephone Encounter (Signed)
Left vm for pt to return call to clinic  

## 2016-09-11 LAB — URINE CULTURE: ORGANISM ID, BACTERIA: NO GROWTH

## 2016-09-12 ENCOUNTER — Telehealth: Payer: Self-pay

## 2016-09-12 NOTE — Telephone Encounter (Signed)
Can we call pharmacy and see if this was filled? Or look it up and see if filled anywhere? Her last note was that toradol was too expensive.

## 2016-09-12 NOTE — Telephone Encounter (Signed)
Brittney Gonzalez called and reports she lost the Toradol prescription. She would like another prescription written but for # 60 with 2 refills. Please advise.

## 2016-09-16 NOTE — Telephone Encounter (Signed)
Ok so she has the toradol?

## 2016-09-16 NOTE — Telephone Encounter (Signed)
Spoke with pharmacy and pt picked up the rx on Friday the 27th.  Her ins doesn't cover the Toradol but the pharm gave her a discount card and she paid $24 out of pocket.

## 2016-09-17 NOTE — Telephone Encounter (Signed)
Pt called clinic requesting Rx for Toradol. Pt states she only got a partial Rx due to cost. Insurance will not cover. Request we send a new Rx.  Called pharmacy to verify what Rx Pt picked up. Spoke with the Pharmacist Burna Forts. Was advised this Pt has strong history of abusing Rx's and taking much more than the directions states. Was advised Pt took 25 Phenergan tablets in less than 2 days, causing her to be hospitalized. This was added to her allergy list but Pt continued to request Rx from Provider and when Rx got denied over and over she found a new Provider.   Pt picked up quantity #20 of Toradol on 09/12/16. This was all Pt could afford based on her OOP cost. Insurance will not cover Rx because this Rx is "designed for short term use." Pharmacist states Pt's should not be on this Rx for greater than 10 days for potential kidney/liver damage. Pharmacist does state Pt was taking Meloxicam back in June, unsure why Rx was discontinued.   Will advise PCP.

## 2016-09-18 NOTE — Telephone Encounter (Signed)
Called and spoke with Pt. Questions why we won't write her narcotics. Went into detail that she broke her pain contract. Pt states "she shouldn't be able to drug test me when she wants, she has to give me notice." Informed when Pt signed her contract that gave the Provider the right to drug test when required, and must be completed within 2 days. Pt request a copy of her pain contact be mailed to her, it has been printed and will go out today.  Pt is OK to try diclofenac twice a day. Denied Meloxicam, states it "doesn't work." Denies pain clinic referral stating "I don't like what they do there." Again ask for narcotics, or Toradol. Denied Pt's request. Will route to PCP for new Rx to be sent.

## 2016-09-18 NOTE — Telephone Encounter (Signed)
Call pt:  Let her know that clearly she is taking toradol "very" regularly due to the need for a refill this soon. This medication can effect kidney and liver. You should only be using it sparingly.  Due to UDS last visit I am not able to prescribe any narcotics for you. If you need something every day we can given mobic daily or diclofenac twice a day. If you are still struggling with pain you may need to consider pain clinic.

## 2016-09-19 ENCOUNTER — Other Ambulatory Visit: Payer: Self-pay | Admitting: Physician Assistant

## 2016-09-19 MED ORDER — DICLOFENAC SODIUM 75 MG PO TBEC: 75.0000 mg | DELAYED_RELEASE_TABLET | Freq: Two times a day (BID) | ORAL | 5 refills | Status: DC

## 2016-09-19 NOTE — Telephone Encounter (Signed)
Diclofenac sent.

## 2016-10-14 ENCOUNTER — Telehealth: Payer: Self-pay

## 2016-10-14 ENCOUNTER — Ambulatory Visit: Payer: Self-pay | Admitting: Physician Assistant

## 2016-10-19 ENCOUNTER — Other Ambulatory Visit: Payer: Self-pay | Admitting: Physician Assistant

## 2016-10-27 ENCOUNTER — Other Ambulatory Visit: Payer: Self-pay | Admitting: Physician Assistant

## 2016-10-29 ENCOUNTER — Ambulatory Visit: Payer: Self-pay | Admitting: Physician Assistant

## 2016-10-30 ENCOUNTER — Telehealth: Payer: Self-pay | Admitting: *Deleted

## 2016-10-30 DIAGNOSIS — E785 Hyperlipidemia, unspecified: Secondary | ICD-10-CM

## 2016-10-30 DIAGNOSIS — E119 Type 2 diabetes mellitus without complications: Secondary | ICD-10-CM

## 2016-10-30 NOTE — Telephone Encounter (Signed)
Fasting labs and referral placed.

## 2016-11-25 ENCOUNTER — Telehealth: Payer: Self-pay | Admitting: *Deleted

## 2016-11-25 MED ORDER — AMBULATORY NON FORMULARY MEDICATION: 11 refills | Status: AC

## 2016-11-25 NOTE — Telephone Encounter (Signed)
rx sent

## 2016-11-25 NOTE — Telephone Encounter (Signed)
PA submitted through [id number] - PA Case ID: [id number]

## 2016-12-02 ENCOUNTER — Other Ambulatory Visit: Payer: Self-pay | Admitting: Physician Assistant

## 2016-12-02 ENCOUNTER — Ambulatory Visit: Payer: Self-pay | Admitting: Physician Assistant

## 2016-12-16 NOTE — Telephone Encounter (Signed)
toradol was denied.letter in providers basket

## 2016-12-22 ENCOUNTER — Ambulatory Visit: Payer: Self-pay | Admitting: Physician Assistant

## 2016-12-30 ENCOUNTER — Telehealth: Payer: Self-pay | Admitting: Physician Assistant

## 2017-01-05 ENCOUNTER — Other Ambulatory Visit: Payer: Self-pay | Admitting: Physician Assistant

## 2017-01-06 ENCOUNTER — Other Ambulatory Visit: Payer: Self-pay | Admitting: Physician Assistant

## 2017-01-08 ENCOUNTER — Other Ambulatory Visit: Payer: Self-pay | Admitting: *Deleted

## 2017-01-13 ENCOUNTER — Other Ambulatory Visit: Payer: Self-pay | Admitting: Physician Assistant

## 2017-01-14 ENCOUNTER — Other Ambulatory Visit: Payer: Self-pay

## 2017-01-14 ENCOUNTER — Telehealth: Payer: Self-pay | Admitting: Physician Assistant

## 2017-01-14 MED ORDER — ONETOUCH ULTRASOFT LANCETS MISC: 99 refills | Status: AC

## 2017-01-14 MED ORDER — ONETOUCH ULTRA 2 W/DEVICE KIT: PACK | 99 refills | Status: AC

## 2017-01-14 NOTE — Telephone Encounter (Signed)
LM for pt to sch AWV with [place]

## 2017-01-20 ENCOUNTER — Ambulatory Visit: Payer: Self-pay | Admitting: Physician Assistant

## 2017-02-04 ENCOUNTER — Other Ambulatory Visit: Payer: Self-pay | Admitting: Physician Assistant

## 2017-02-18 ENCOUNTER — Other Ambulatory Visit: Payer: Self-pay | Admitting: Physician Assistant

## 2017-03-04 ENCOUNTER — Telehealth: Payer: Self-pay | Admitting: Physician Assistant

## 2017-03-04 ENCOUNTER — Other Ambulatory Visit: Payer: Self-pay | Admitting: Physician Assistant

## 2017-03-04 NOTE — Telephone Encounter (Signed)
She needs to discuss something with you guys about her meds. She is in pain from a fall.

## 2017-03-06 NOTE — Telephone Encounter (Signed)
Ok to call and see what patient wants.

## 2017-03-16 ENCOUNTER — Telehealth: Payer: Self-pay

## 2017-03-16 NOTE — Telephone Encounter (Signed)
Pt needs insulin. Please send over toujeo 10units at bedtime and needs follow up in office this week. We have samples if she would like first pen a sample.

## 2017-03-16 NOTE — Telephone Encounter (Signed)
Called and left a message for patient regarding providers response.

## 2017-03-16 NOTE — Telephone Encounter (Signed)
Brittney Gonzalez with home health called and said that patient was not feeling well today.  Pt told her that her BG was >600 this morning around 6 am.  After lunch it was still 354.  She took a metformin this morning, and was going to take another.  Please advise as to any instructions you want for the patient.

## 2017-03-22 ENCOUNTER — Other Ambulatory Visit: Payer: Self-pay | Admitting: Physician Assistant

## 2017-03-24 ENCOUNTER — Other Ambulatory Visit: Payer: Self-pay | Admitting: Physician Assistant

## 2017-03-31 ENCOUNTER — Other Ambulatory Visit: Payer: Self-pay | Admitting: Physician Assistant

## 2017-06-04 ENCOUNTER — Other Ambulatory Visit: Payer: Self-pay | Admitting: Physician Assistant

## 2017-08-19 NOTE — Telephone Encounter (Signed)
Issue resolved.

## 2017-12-24 ENCOUNTER — Other Ambulatory Visit: Payer: Self-pay

## 2017-12-24 ENCOUNTER — Emergency Department (INDEPENDENT_AMBULATORY_CARE_PROVIDER_SITE_OTHER)
Admission: EM | Admit: 2017-12-24 | Discharge: 2017-12-24 | Disposition: A | Discharge status: Home / Self Care | Payer: Medicare Other | Source: Home / Self Care | Attending: Family Medicine | Admitting: Family Medicine

## 2017-12-24 DIAGNOSIS — R197 Diarrhea, unspecified: Secondary | ICD-10-CM

## 2017-12-24 DIAGNOSIS — R69 Illness, unspecified: Secondary | ICD-10-CM | POA: Diagnosis not present

## 2017-12-24 DIAGNOSIS — R51 Headache: Secondary | ICD-10-CM

## 2017-12-24 DIAGNOSIS — J111 Influenza due to unidentified influenza virus with other respiratory manifestations: Secondary | ICD-10-CM

## 2017-12-24 DIAGNOSIS — R519 Headache, unspecified: Secondary | ICD-10-CM

## 2017-12-24 DIAGNOSIS — R112 Nausea with vomiting, unspecified: Secondary | ICD-10-CM

## 2017-12-24 MED ORDER — AZITHROMYCIN 250 MG PO TABS: ORAL_TABLET | ORAL | 0 refills | Status: DC

## 2017-12-24 MED ORDER — PROMETHAZINE HCL 25 MG PO TABS: 25.0000 mg | ORAL_TABLET | Freq: Four times a day (QID) | ORAL | 0 refills | Status: DC | PRN

## 2017-12-24 MED ORDER — BENZONATATE 200 MG PO CAPS: ORAL_CAPSULE | ORAL | 0 refills | Status: DC

## 2017-12-24 MED ORDER — KETOROLAC TROMETHAMINE 30 MG/ML IJ SOLN
30.0000 mg | Freq: Once | INTRAMUSCULAR | Status: AC
  Administered 2017-12-24: 30 mg via INTRAMUSCULAR

## 2017-12-24 MED ORDER — PROMETHAZINE HCL 25 MG/ML IJ SOLN
25.0000 mg | Freq: Once | INTRAMUSCULAR | Status: AC
  Administered 2017-12-24: 25 mg via INTRAMUSCULAR

## 2017-12-24 NOTE — Discharge Instructions (Signed)
Take plain guaifenesin (1200mg  extended release tabs such as Mucinex) twice daily, with plenty of water, for cough and congestion.  Get adequate rest.   Begin clear liquids (Pedialyte while having diarrhea) until improved, then advance to a [employer] (Bananas, Rice, Applesauce, Toast).  Then gradually resume a regular diet when tolerated.  Avoid milk products until well.  To decrease diarrhea, mix one teaspoon Citrucel (methylcellulose) in 2 oz water and drink one to three times daily.  Do not drink extra fluids with this dose and do not drink fluids for one hour afterwards.     If symptoms become significantly worse during the night or over the weekend, proceed to the local emergency room.   May use Afrin nasal spray (or generic oxymetazoline) each morning for about 5 days and then discontinue.  Also recommend using saline nasal spray several times daily and saline nasal irrigation (AYR is a common brand).  Use Flonase nasal spray each morning after using Afrin nasal spray and saline nasal irrigation. Try warm salt water gargles for sore throat.  Stop all antihistamines for now, and other non-prescription cough/cold preparations. May take Delsym Cough Suppressant with Tessalon at bedtime for nighttime cough.

## 2017-12-24 NOTE — ED Triage Notes (Signed)
Pt has had a severe headache, pain in the cheeks, and behind the eyes for 2 days.  She feels short of breath, O2 sat 96%, and ear pain bilat.  Pt has been having V/D, all these sx have been for 2 days.

## 2017-12-24 NOTE — ED Provider Notes (Signed)
[facility]    CSN: [id number] Arrival date & time: 12/24/17  0846     History   Chief Complaint Chief Complaint  Patient presents with  . Headache  . Cough  . Otalgia    HPI Brittney Gonzalez is a 57 y.o. female.   Complains of 4 day history flu-like illness including myalgias, headache, fever/chills, fatigue, and cough.  Also has mild nasal congestion and sore throat.  Cough is non-productive and somewhat worse at night.  No pleuritic pain but she does complain of shortness of breath, and has developed a typical migraine.  She has had nausea/vomiting, but is able to take fluids   The history is provided by the patient and the spouse.    Past Medical History:  Diagnosis Date  . Anxiety   . Bipolar disorder ([place])   . Diabetes mellitus type II   . GERD (gastroesophageal reflux disease)   . Hyperlipemia   . Hypertension   . TIA (transient ischemic attack)     Patient Active Problem List   Diagnosis Date Noted  . Diabetes type 2, uncontrolled ([place]) 09/01/2016  . Marijuana use 07/04/2016  . Cystocele 07/01/2016  . Vaginal irritation 07/01/2016  . Decreased libido 05/14/2016  . PTSD (post-traumatic stress disorder) 05/14/2016  . Fatty liver disease, nonalcoholic 41/66/0630  . Vitamin D deficiency 03/12/2016  . Elevated liver enzymes 03/12/2016  . Elevated TSH 03/12/2016  . Loose stools 03/12/2016  . Controlled type 2 diabetes mellitus without complication, without long-term current use of insulin ([place]) 03/11/2016  . Bipolar 1 disorder, mixed, moderate ([place]) 03/11/2016  . COPD exacerbation ([place]) 03/11/2016  . Insomnia 03/11/2016  . Hyperlipidemia associated with type 2 diabetes mellitus ([place]) 03/11/2016  . No energy 03/11/2016  . Right shoulder pain 03/11/2016  . Atypical chest pain 03/11/2016  . Bipolar 1 disorder, mixed ([place]) 10/03/2011    Class: Chronic  . ANKLE SPRAIN, RIGHT 09/23/2010  . COUGH 06/27/2010  . ALLERGIC RHINITIS 06/19/2010  . G E  R D 06/19/2010  . LUMBAR STRAIN, ACUTE 05/25/2010  . MYALGIA 03/28/2010  . DIABETES MELLITUS, TYPE II 03/11/2010  . Hyperlipidemia 03/11/2010  . Musculoskeletal disease 03/11/2010    Past Surgical History:  Procedure Laterality Date  . ABDOMINAL HYSTERECTOMY    . APPENDECTOMY    . RIGHT OOPHORECTOMY      OB History    No data available       Home Medications    Prior to Admission medications   Medication Sig Start Date End Date Taking? Authorizing Provider  albuterol (PROVENTIL HFA;VENTOLIN HFA) 108 (90 Base) MCG/ACT inhaler Inhale 2 puffs into the lungs every 6 (six) hours as needed for wheezing or shortness of breath. 03/11/16   Breeback, Jade L, PA-C  alprazolam (XANAX) 2 MG tablet Take 2 mg by mouth 4 (four) times daily.    [provider]  AMBULATORY NON FORMULARY MEDICATION One touch test strips and lancets. 06/26/16   Breeback, Royetta Car, PA-C  AMBULATORY NON FORMULARY MEDICATION One touch Delica 16W Lancets [id number] Use to test blood sugar three times daily [phone] 11/25/16   Breeback, Jade L, PA-C  azithromycin (ZITHROMAX Z-PAK) 250 MG tablet Take 2 tabs today; then begin one tab once daily for 4 more days. 12/24/17   Kandra Nicolas, MD  benzonatate (TESSALON) 200 MG capsule Take one cap by mouth at bedtime as needed for cough.  May repeat in 4 to 6 hours 12/24/17   Kandra Nicolas, MD  Blood Glucose Monitoring Suppl (ONE TOUCH ULTRA 2) w/Device KIT Check fasting blood sugar every morning and 2 hours after one meal of the day. 01/14/17   Breeback, Royetta Car, PA-C  budesonide-formoterol (SYMBICORT) 160-4.5 MCG/ACT inhaler Inhale 2 puffs into the lungs 2 (two) times daily. 03/11/16   Breeback, Jade L, PA-C  cetirizine (ZYRTEC) 10 MG tablet Take 1 tablet (10 mg total) by mouth daily. 03/11/16   Breeback, Royetta Car, PA-C  diclofenac (VOLTAREN) 75 MG EC tablet Take 1 tablet (75 mg total) by mouth 2 (two) times daily. 09/19/16   Breeback, Jade L, PA-C    diphenoxylate-atropine (LOMOTIL) 2.5-0.025 MG tablet TAKE 1 TO 2 TABLETS BY MOUTH FOUR TIMES DAILY AS NEEDED FOR DIARRHEA 01/14/17   Breeback, Jade L, PA-C  fluticasone (FLONASE) 50 MCG/ACT nasal spray Place 2 sprays into both nostrils daily. 03/11/16   Breeback, Jade L, PA-C  furosemide (LASIX) 40 MG tablet TAKE 1 TABLET BY MOUTH DAILY AS NEEDED 07/18/16   Breeback, Jade L, PA-C  glipiZIDE (GLUCOTROL) 10 MG tablet Take 1 tablet (10 mg total) by mouth daily before breakfast. MUST make appointment for future refills. 03/24/17   Breeback, Jade L, PA-C  ketorolac (TORADOL) 10 MG tablet Take 1 tablet (10 mg total) by mouth every 8 (eight) hours as needed. 09/09/16   Breeback, Jade L, PA-C  Lancets (ONETOUCH ULTRASOFT) lancets Check fasting blood sugar every morning and 2 hours after one meal of the day. 01/14/17   Breeback, Royetta Car, PA-C  metFORMIN (GLUCOPHAGE) 500 MG tablet Take 2 tablets (1,000 mg total) by mouth 2 (two) times daily with a meal. NEED FOLLOW UP APPOINTMENT FOR MORE REFILLS 12/02/16   Breeback, Jade L, PA-C  metFORMIN (GLUCOPHAGE) 500 MG tablet Take 2 tablets (1,000 mg total) by mouth 2 (two) times daily with a meal. NEED FOLLOW UP APPOINTMENT FOR MORE REFILLS 02/04/17   Breeback, Jade L, PA-C  ondansetron (ZOFRAN-ODT) 8 MG disintegrating tablet Take 1 tablet (8 mg total) by mouth every 8 (eight) hours as needed for nausea or vomiting. 09/02/16   Emeterio Reeve, DO  ONE TOUCH ULTRA TEST test strip USE TO TEST BLOOD SUGAR FOR A MAXIMUM OF 4 TIMES DAILY 01/08/17   Breeback, Jade L, PA-C  promethazine (PHENERGAN) 25 MG tablet Take 1 tablet (25 mg total) by mouth every 6 (six) hours as needed for nausea or vomiting. 12/24/17   Kandra Nicolas, MD  traZODone (DESYREL) 150 MG tablet Take 150 mg by mouth at bedtime.    [provider]  Vitamin D, Ergocalciferol, (DRISDOL) 50000 units CAPS capsule Take 1 capsule (50,000 Units total) by mouth every 7 (seven) days. 03/12/16   Donella Stade, PA-C     Family History Family History  Problem Relation Age of Onset  . Alcohol abuse Father   . Hypertension Father   . Diabetes Father   . Aneurysm Father   . COPD Father   . COPD Mother   . Colon cancer Mother     Social History Social History   Tobacco Use  . Smoking status: Never Smoker  . Smokeless tobacco: Never Used  Substance Use Topics  . Alcohol use: Yes    Comment: socially  . Drug use: No     Allergies   Celebrex [celecoxib]; Ciprofloxacin; Darvon; Hydrocodone-acetaminophen; Morphine sulfate; Oxycodone-acetaminophen; Phenergan [promethazine hcl]; Propoxyphene hcl; Propoxyphene n-acetaminophen; Sulfonamide derivatives; and Tramadol hcl   Review of Systems Review of Systems + sore throat + cough No pleuritic pain No  wheezing + nasal congestion + post-nasal drainage + sinus pain/pressure No itchy/red eyes + earache No hemoptysis + SOB + fever, + chills + nausea + vomiting No abdominal pain No diarrhea No urinary symptoms No skin rash + fatigue + myalgias + headache Used OTC meds without relief   Physical Exam Triage Vital Signs ED Triage Vitals [12/24/17 0909]  Enc Vitals Group     BP 112/73     Pulse Rate 99     Resp 18     Temp 97.7 F (36.5 C)     Temp Source Oral     SpO2 96 %     Weight 131 lb (59.4 kg)     Height '5\' 8"'$  (1.727 m)     Head Circumference      Peak Flow      Pain Score 10     Pain Loc      Pain Edu?      Excl. in [place]?    No data found.  Updated Vital Signs BP 112/73 (BP Location: Right Arm)   Pulse 99   Temp 97.7 F (36.5 C) (Oral)   Resp 18   Ht '5\' 8"'$  (1.727 m)   Wt 131 lb (59.4 kg)   SpO2 96%   BMI 19.92 kg/m   Visual Acuity Right Eye Distance:   Left Eye Distance:   Bilateral Distance:    Right Eye Near:   Left Eye Near:    Bilateral Near:     Physical Exam Nursing notes and Vital Signs reviewed. Appearance:  Patient appears stated age, and in no acute distress Eyes:  Pupils are equal,  round, and reactive to light and accomodation.  Extraocular movement is intact.  Conjunctivae are not inflamed  Ears:  Canals normal.  Tympanic membranes normal.  Nose:  Mildly congested turbinates.  No sinus tenderness. Pharynx:  Normal; moist mucous membranes  Neck:  Supple.  Enlarged posterior/lateral nodes are palpated bilaterally, tender to palpation on the left.   Lungs:  Clear to auscultation.  Breath sounds are equal.  Moving air well. Heart:  Regular rate and rhythm without murmurs, rubs, or gallops.  Abdomen:  Nontender without masses or hepatosplenomegaly.  Bowel sounds are present.  No CVA or flank tenderness.  Extremities:  No edema.  Skin:  No rash present.  Neurologic:  Cranial nerves 2 through 12 are normal.   UC Treatments / Results  Labs (all labs ordered are listed, but only abnormal results are displayed) Labs Reviewed - No data to display  EKG  EKG Interpretation None       Radiology No results found.  Procedures Procedures (including critical care time)  Medications Ordered in UC Medications  ketorolac (TORADOL) 30 MG/ML injection 30 mg (not administered)  promethazine (PHENERGAN) injection 25 mg (not administered)     Initial Impression / Assessment and Plan / UC Course  I have reviewed the triage vital signs and the nursing notes.  Pertinent labs & imaging results that were available during my care of the patient were reviewed by me and considered in my medical decision making (see chart for details).    Patient has missed 48 hour window of opportunity for beginning Tamiflu.  Will begin empiric Z-pak.  Administered Toradol '30mg'$  IM for headache.  Administered Phenergan '25mg'$  IM for nausea.  Given Rx for Phenergan '25mg'$  PO. Prescription written for Benzonatate (Tessalon) to take at bedtime for night-time cough.  Take plain guaifenesin ('1200mg'$  extended release tabs such as Mucinex)  twice daily, with plenty of water, for cough and congestion.  Get  adequate rest.    Begin clear liquids (Pedialyte while having diarrhea) until improved, then advance to a [employer] (Bananas, Rice, Applesauce, Toast).  Then gradually resume a regular diet when tolerated.  Avoid milk products until well.  To decrease diarrhea, mix one teaspoon Citrucel (methylcellulose) in 2 oz water and drink one to three times daily.  Do not drink extra fluids with this dose and do not drink fluids for one hour afterwards.     If symptoms become significantly worse during the night or over the weekend, proceed to the local emergency room.    May use Afrin nasal spray (or generic oxymetazoline) each morning for about 5 days and then discontinue.  Also recommend using saline nasal spray several times daily and saline nasal irrigation (AYR is a common brand).  Use Flonase nasal spray each morning after using Afrin nasal spray and saline nasal irrigation. Try warm salt water gargles for sore throat.  Stop all antihistamines for now, and other non-prescription cough/cold preparations.    Final Clinical Impressions(s) / UC Diagnoses   Final diagnoses:  Influenza-like illness  Nausea vomiting and diarrhea    ED Discharge Orders        Ordered    azithromycin (ZITHROMAX Z-PAK) 250 MG tablet     12/24/17 0946    benzonatate (TESSALON) 200 MG capsule     12/24/17 0946    promethazine (PHENERGAN) 25 MG tablet  Every 6 hours PRN     12/24/17 0948          Kandra Nicolas, MD 12/27/17 1447

## 2018-03-05 ENCOUNTER — Encounter: Payer: Self-pay | Admitting: Emergency Medicine

## 2018-03-05 ENCOUNTER — Emergency Department (INDEPENDENT_AMBULATORY_CARE_PROVIDER_SITE_OTHER)
Admission: EM | Admit: 2018-03-05 | Discharge: 2018-03-05 | Disposition: A | Discharge status: Home / Self Care | Payer: Medicare Other | Source: Home / Self Care | Attending: Family Medicine | Admitting: Family Medicine

## 2018-03-05 ENCOUNTER — Other Ambulatory Visit: Payer: Self-pay

## 2018-03-05 DIAGNOSIS — M545 Low back pain, unspecified: Secondary | ICD-10-CM

## 2018-03-05 DIAGNOSIS — R197 Diarrhea, unspecified: Secondary | ICD-10-CM

## 2018-03-05 DIAGNOSIS — J0101 Acute recurrent maxillary sinusitis: Secondary | ICD-10-CM

## 2018-03-05 DIAGNOSIS — R112 Nausea with vomiting, unspecified: Secondary | ICD-10-CM

## 2018-03-05 MED ORDER — KETOROLAC TROMETHAMINE 60 MG/2ML IM SOLN
60.0000 mg | Freq: Once | INTRAMUSCULAR | Status: AC
  Administered 2018-03-05: 60 mg via INTRAMUSCULAR

## 2018-03-05 MED ORDER — PROMETHAZINE HCL 25 MG/ML IJ SOLN
50.0000 mg | Freq: Once | INTRAMUSCULAR | Status: AC
  Administered 2018-03-05: 50 mg via INTRAMUSCULAR

## 2018-03-05 MED ORDER — PROMETHAZINE HCL 25 MG PO TABS: 25.0000 mg | ORAL_TABLET | Freq: Four times a day (QID) | ORAL | 0 refills | Status: DC | PRN

## 2018-03-05 MED ORDER — DIPHENOXYLATE-ATROPINE 2.5-0.025 MG PO TABS: 1.0000 | ORAL_TABLET | Freq: Four times a day (QID) | ORAL | 0 refills | Status: DC | PRN

## 2018-03-05 MED ORDER — AMOXICILLIN-POT CLAVULANATE ER 1000-62.5 MG PO TB12: ORAL_TABLET | ORAL | 0 refills | Status: DC

## 2018-03-05 MED ORDER — KETOROLAC TROMETHAMINE 10 MG PO TABS: 10.0000 mg | ORAL_TABLET | Freq: Three times a day (TID) | ORAL | 2 refills | Status: DC | PRN

## 2018-03-05 NOTE — ED Provider Notes (Signed)
Brittney Gonzalez CARE    CSN: [id number] Arrival date & time: 03/05/18  1003     History   Chief Complaint Chief Complaint  Patient presents with  . Headache  . Facial Pain  . Emesis    HPI Brittney Gonzalez is a 57 y.o. female.   Patient states that she has had sinus congestion and right earache for about a week.  She developed a typical migraine headache 3 days ago.  Two days ago she developed intermittent diarrhea, and yesterday she developed nausea/vomiting.  She states that she had fever to 102 at 0400 today.  No cough.  No urinary symptoms.     Past Medical History:  Diagnosis Date  . Anxiety   . Bipolar disorder ([place])   . Diabetes mellitus type II   . GERD (gastroesophageal reflux disease)   . Hyperlipemia   . Hypertension   . TIA (transient ischemic attack)     Patient Active Problem List   Diagnosis Date Noted  . Diabetes type 2, uncontrolled ([place]) 09/01/2016  . Marijuana use 07/04/2016  . Cystocele 07/01/2016  . Vaginal irritation 07/01/2016  . Decreased libido 05/14/2016  . PTSD (post-traumatic stress disorder) 05/14/2016  . Fatty liver disease, nonalcoholic 23/76/2831  . Vitamin D deficiency 03/12/2016  . Elevated liver enzymes 03/12/2016  . Elevated TSH 03/12/2016  . Loose stools 03/12/2016  . Controlled type 2 diabetes mellitus without complication, without long-term current use of insulin ([place]) 03/11/2016  . Bipolar 1 disorder, mixed, moderate ([place]) 03/11/2016  . COPD exacerbation ([place]) 03/11/2016  . Insomnia 03/11/2016  . Hyperlipidemia associated with type 2 diabetes mellitus ([place]) 03/11/2016  . No energy 03/11/2016  . Right shoulder pain 03/11/2016  . Atypical chest pain 03/11/2016  . Bipolar 1 disorder, mixed ([place]) 10/03/2011    Class: Chronic  . ANKLE SPRAIN, RIGHT 09/23/2010  . COUGH 06/27/2010  . ALLERGIC RHINITIS 06/19/2010  . G E R D 06/19/2010  . LUMBAR STRAIN, ACUTE 05/25/2010  . MYALGIA 03/28/2010  . DIABETES MELLITUS,  TYPE II 03/11/2010  . Hyperlipidemia 03/11/2010  . Musculoskeletal disease 03/11/2010    Past Surgical History:  Procedure Laterality Date  . ABDOMINAL HYSTERECTOMY    . APPENDECTOMY    . RIGHT OOPHORECTOMY      OB History   None      Home Medications    Prior to Admission medications   Medication Sig Start Date End Date Taking? Authorizing Provider  albuterol (PROVENTIL HFA;VENTOLIN HFA) 108 (90 Base) MCG/ACT inhaler Inhale 2 puffs into the lungs every 6 (six) hours as needed for wheezing or shortness of breath. 03/11/16   Breeback, Jade L, PA-C  alprazolam (XANAX) 2 MG tablet Take 2 mg by mouth 4 (four) times daily.    [provider]  AMBULATORY NON FORMULARY MEDICATION One touch test strips and lancets. 06/26/16   Breeback, Royetta Car, PA-C  AMBULATORY NON FORMULARY MEDICATION One touch Delica 51V Lancets [id number] Use to test blood sugar three times daily [phone] 11/25/16   Breeback, Jade L, PA-C  amoxicillin-clavulanate (AUGMENTIN XR) 1000-62.5 MG 12 hr tablet Take one tab by mouth every 12 hours 03/05/18   Kandra Nicolas, MD  azithromycin (ZITHROMAX Z-PAK) 250 MG tablet Take 2 tabs today; then begin one tab once daily for 4 more days. 12/24/17   Kandra Nicolas, MD  benzonatate (TESSALON) 200 MG capsule Take one cap by mouth at bedtime as needed for cough.  May repeat in 4 to 6 hours 12/24/17  Kandra Nicolas, MD  Blood Glucose Monitoring Suppl (ONE TOUCH ULTRA 2) w/Device KIT Check fasting blood sugar every morning and 2 hours after one meal of the day. 01/14/17   Breeback, Royetta Car, PA-C  budesonide-formoterol (SYMBICORT) 160-4.5 MCG/ACT inhaler Inhale 2 puffs into the lungs 2 (two) times daily. 03/11/16   Breeback, Jade L, PA-C  cetirizine (ZYRTEC) 10 MG tablet Take 1 tablet (10 mg total) by mouth daily. 03/11/16   Breeback, Royetta Car, PA-C  diclofenac (VOLTAREN) 75 MG EC tablet Take 1 tablet (75 mg total) by mouth 2 (two) times daily. 09/19/16   Breeback, Royetta Car,  PA-C  diphenoxylate-atropine (LOMOTIL) 2.5-0.025 MG tablet Take 1 tablet by mouth 4 (four) times daily as needed for diarrhea or loose stools. 03/05/18   Kandra Nicolas, MD  fluticasone (FLONASE) 50 MCG/ACT nasal spray Place 2 sprays into both nostrils daily. 03/11/16   Breeback, Jade L, PA-C  furosemide (LASIX) 40 MG tablet TAKE 1 TABLET BY MOUTH DAILY AS NEEDED 07/18/16   Breeback, Jade L, PA-C  glipiZIDE (GLUCOTROL) 10 MG tablet Take 1 tablet (10 mg total) by mouth daily before breakfast. MUST make appointment for future refills. 03/24/17   Breeback, Jade L, PA-C  ketorolac (TORADOL) 10 MG tablet Take 1 tablet (10 mg total) by mouth every 8 (eight) hours as needed for moderate pain. 03/05/18   Kandra Nicolas, MD  Lancets [facility] ULTRASOFT) lancets Check fasting blood sugar every morning and 2 hours after one meal of the day. 01/14/17   Breeback, Royetta Car, PA-C  metFORMIN (GLUCOPHAGE) 500 MG tablet Take 2 tablets (1,000 mg total) by mouth 2 (two) times daily with a meal. NEED FOLLOW UP APPOINTMENT FOR MORE REFILLS 12/02/16   Breeback, Jade L, PA-C  metFORMIN (GLUCOPHAGE) 500 MG tablet Take 2 tablets (1,000 mg total) by mouth 2 (two) times daily with a meal. NEED FOLLOW UP APPOINTMENT FOR MORE REFILLS 02/04/17   Breeback, Jade L, PA-C  ondansetron (ZOFRAN-ODT) 8 MG disintegrating tablet Take 1 tablet (8 mg total) by mouth every 8 (eight) hours as needed for nausea or vomiting. 09/02/16   Emeterio Reeve, DO  ONE TOUCH ULTRA TEST test strip USE TO TEST BLOOD SUGAR FOR A MAXIMUM OF 4 TIMES DAILY 01/08/17   Breeback, Jade L, PA-C  promethazine (PHENERGAN) 25 MG tablet Take 1 tablet (25 mg total) by mouth every 6 (six) hours as needed for nausea or vomiting. 03/05/18   Kandra Nicolas, MD  traZODone (DESYREL) 150 MG tablet Take 150 mg by mouth at bedtime.    [provider]  Vitamin D, Ergocalciferol, (DRISDOL) 50000 units CAPS capsule Take 1 capsule (50,000 Units total) by mouth every 7 (seven) days.  03/12/16   Donella Stade, PA-C    Family History Family History  Problem Relation Age of Onset  . Alcohol abuse Father   . Hypertension Father   . Diabetes Father   . Aneurysm Father   . COPD Father   . COPD Mother   . Colon cancer Mother     Social History Social History   Tobacco Use  . Smoking status: Never Smoker  . Smokeless tobacco: Never Used  Substance Use Topics  . Alcohol use: Yes    Comment: socially  . Drug use: No     Allergies   Celebrex [celecoxib]; Ciprofloxacin; Darvon; Hydrocodone-acetaminophen; Morphine sulfate; Oxycodone-acetaminophen; Phenergan [promethazine hcl]; Propoxyphene hcl; Propoxyphene n-acetaminophen; Sulfonamide derivatives; and Tramadol hcl   Review of Systems Review of Systems  No sore throat No cough No pleuritic pain No wheezing + nasal congestion + post-nasal drainage + sinus pain/pressure No itchy/red eyes + earache No hemoptysis No SOB + fever, + chills + nausea + vomiting No abdominal pain + diarrhea No urinary symptoms No skin rash + fatigue No myalgias + headache    Physical Exam Triage Vital Signs ED Triage Vitals  Enc Vitals Group     BP 03/05/18 1039 116/68     Pulse Rate 03/05/18 1039 81     Resp --      Temp 03/05/18 1039 98.5 F (36.9 C)     Temp src --      SpO2 03/05/18 1039 98 %     Weight 03/05/18 1040 112 lb (50.8 kg)     Height 03/05/18 1040 _0  (1.651 m)     Head Circumference --      Peak Flow --      Pain Score 03/05/18 1040 10     Pain Loc --      Pain Edu? --      Excl. in [place]? --    No data found.  Updated Vital Signs BP 116/68 (BP Location: Right Arm)   Pulse 81   Temp 98.5 F (36.9 C)   Ht _1  (1.651 m)   Wt 132 lb (59.9 kg)   SpO2 98%   BMI 21.97 kg/m   Visual Acuity Right Eye Distance:   Left Eye Distance:   Bilateral Distance:    Right Eye Near:   Left Eye Near:    Bilateral Near:     Physical Exam Nursing notes and Vital Signs reviewed. Appearance:   Patient appears stated age, and in no acute distress Eyes:  Pupils are equal, round, and reactive to light and accomodation.  Extraocular movement is intact.  Conjunctivae are not inflamed  Ears:  Canals normal.  Tympanic membranes normal.  Nose:  Congested turbinates.  Maxillary sinus tenderness is present.  Pharynx:  Normal Neck:  Supple.  No adenopathy Lungs:  Clear to auscultation.  Breath sounds are equal.  Moving air well. Heart:  Regular rate and rhythm without murmurs, rubs, or gallops.  Abdomen:  Nontender without masses or hepatosplenomegaly.  Bowel sounds are present.  No CVA or flank tenderness.  Extremities:  No edema.  Skin:  No rash present.    UC Treatments / Results  Labs (all labs ordered are listed, but only abnormal results are displayed) Labs Reviewed - No data to display  EKG None Radiology No results found.  Procedures Procedures (including critical care time)  Medications Ordered in UC Medications  ketorolac (TORADOL) injection 60 mg (has no administration in time range)  promethazine (PHENERGAN) injection 50 mg (has no administration in time range)     Initial Impression / Assessment and Plan / UC Course  I have reviewed the triage vital signs and the nursing notes.  Pertinent labs & imaging results that were available during Brittney care of the patient were reviewed by me and considered in Brittney medical decision making (see chart for details).    Administered Toradol 20m IM Administered Phenergan 564mIM Begin Augmentin.  Rx for Toradol and Lomotil. Begin clear liquids (Pedialyte while having diarrhea) until improved, then advance to a BRMolson Coors BrewingBananas, Rice, Applesauce, Toast).  Then gradually resume a regular diet when tolerated.  Avoid milk products until well.   Followup with Family Doctor if not improved in about 5 days.    Final  Clinical Impressions(s) / UC Diagnoses   Final diagnoses:  Acute recurrent maxillary sinusitis  Nausea vomiting  and diarrhea    ED Discharge Orders        Ordered    ketorolac (TORADOL) 10 MG tablet  Every 8 hours PRN     03/05/18 1157    promethazine (PHENERGAN) 25 MG tablet  Every 6 hours PRN     03/05/18 1157    diphenoxylate-atropine (LOMOTIL) 2.5-0.025 MG tablet  4 times daily PRN     03/05/18 1159    amoxicillin-clavulanate (AUGMENTIN XR) 1000-62.5 MG 12 hr tablet     03/05/18 1159          Kandra Nicolas, MD 03/11/18 1445

## 2018-03-05 NOTE — ED Triage Notes (Signed)
Patient states it has been over a week that she has had sinus pressure and pain, headache and now vomiting; had fever of 102 at 0400 and took ibuprofen 400mg .

## 2018-03-05 NOTE — Discharge Instructions (Addendum)
Begin clear liquids (Pedialyte while having diarrhea) until improved, then advance to a [employer] (Bananas, Rice, Applesauce, Toast).  Then gradually resume a regular diet when tolerated.  Avoid milk products until well.

## 2018-03-23 ENCOUNTER — Emergency Department (INDEPENDENT_AMBULATORY_CARE_PROVIDER_SITE_OTHER)
Admission: EM | Admit: 2018-03-23 | Discharge: 2018-03-23 | Disposition: A | Discharge status: Home / Self Care | Payer: Medicare Other | Source: Home / Self Care | Attending: Family Medicine | Admitting: Family Medicine

## 2018-03-23 ENCOUNTER — Encounter: Payer: Self-pay | Admitting: *Deleted

## 2018-03-23 ENCOUNTER — Other Ambulatory Visit: Payer: Self-pay

## 2018-03-23 DIAGNOSIS — B9789 Other viral agents as the cause of diseases classified elsewhere: Secondary | ICD-10-CM | POA: Diagnosis not present

## 2018-03-23 DIAGNOSIS — M26629 Arthralgia of temporomandibular joint, unspecified side: Secondary | ICD-10-CM

## 2018-03-23 DIAGNOSIS — M545 Low back pain, unspecified: Secondary | ICD-10-CM

## 2018-03-23 DIAGNOSIS — J069 Acute upper respiratory infection, unspecified: Secondary | ICD-10-CM

## 2018-03-23 DIAGNOSIS — R112 Nausea with vomiting, unspecified: Secondary | ICD-10-CM

## 2018-03-23 DIAGNOSIS — L03213 Periorbital cellulitis: Secondary | ICD-10-CM

## 2018-03-23 MED ORDER — PROMETHAZINE HCL 25 MG RE SUPP: 25.0000 mg | Freq: Four times a day (QID) | RECTAL | 1 refills | Status: DC | PRN

## 2018-03-23 MED ORDER — PREDNISONE 20 MG PO TABS: ORAL_TABLET | ORAL | 0 refills | Status: DC

## 2018-03-23 MED ORDER — PROMETHAZINE HCL 25 MG/ML IJ SOLN
50.0000 mg | Freq: Once | INTRAMUSCULAR | Status: AC
  Administered 2018-03-23: 50 mg via INTRAMUSCULAR

## 2018-03-23 MED ORDER — PROMETHAZINE HCL 25 MG PO TABS: 25.0000 mg | ORAL_TABLET | Freq: Four times a day (QID) | ORAL | 1 refills | Status: DC | PRN

## 2018-03-23 MED ORDER — DIPHENOXYLATE-ATROPINE 2.5-0.025 MG PO TABS: 1.0000 | ORAL_TABLET | Freq: Four times a day (QID) | ORAL | 0 refills | Status: DC | PRN

## 2018-03-23 MED ORDER — CLINDAMYCIN HCL 300 MG PO CAPS: ORAL_CAPSULE | ORAL | 0 refills | Status: DC

## 2018-03-23 MED ORDER — KETOROLAC TROMETHAMINE 60 MG/2ML IM SOLN
60.0000 mg | Freq: Once | INTRAMUSCULAR | Status: AC
  Administered 2018-03-23: 60 mg via INTRAMUSCULAR

## 2018-03-23 MED ORDER — KETOROLAC TROMETHAMINE 10 MG PO TABS: 10.0000 mg | ORAL_TABLET | Freq: Three times a day (TID) | ORAL | 1 refills | Status: DC | PRN

## 2018-03-23 NOTE — Discharge Instructions (Addendum)
Apply ice to the painful jaw joints Put ice in a plastic bag. Place a towel between your skin and the bag. Leave the ice on for 15 minutes, 2-3 times a day.  Take plain guaifenesin (1200mg  extended release tabs such as Mucinex) twice daily, with plenty of water, for cough and congestion.  May add Pseudoephedrine (30mg , one or two every 4 to 6 hours) for sinus congestion.  Get adequate rest.   May use Afrin nasal spray (or generic oxymetazoline) each morning for about 5 days and then discontinue.  Also recommend using saline nasal spray several times daily and saline nasal irrigation (AYR is a common brand).  Use Flonase nasal spray each morning after using Afrin nasal spray and saline nasal irrigation. Try warm salt water gargles for sore throat.  Stop all antihistamines for now, and other non-prescription cough/cold preparations.

## 2018-03-23 NOTE — ED Provider Notes (Signed)
[name]    CSN: [id number] Arrival date & time: 03/23/18  1435     History   Chief Complaint Chief Complaint  Patient presents with  . Otalgia  . Nasal Congestion    HPI Brittney Gonzalez is a 57 y.o. female.   Patient complains of two day history of typical cold-like symptoms, including sore throat, sinus congestion, headache, fatigue, and cough.  Yesterday she developed nausea/vomiting and diarrhea.  Last night she had fever to 102.  Today she developed bilateral earache and pain/swelling in her left upper and lower eyelids.  The history is provided by the patient.    Past Medical History:  Diagnosis Date  . Anxiety   . Bipolar disorder ([place])   . Diabetes mellitus type II   . GERD (gastroesophageal reflux disease)   . Hyperlipemia   . Hypertension   . TIA (transient ischemic attack)     Patient Active Problem List   Diagnosis Date Noted  . Diabetes type 2, uncontrolled ([place]) 09/01/2016  . Marijuana use 07/04/2016  . Cystocele 07/01/2016  . Vaginal irritation 07/01/2016  . Decreased libido 05/14/2016  . PTSD (post-traumatic stress disorder) 05/14/2016  . Fatty liver disease, nonalcoholic 93/23/5573  . Vitamin D deficiency 03/12/2016  . Elevated liver enzymes 03/12/2016  . Elevated TSH 03/12/2016  . Loose stools 03/12/2016  . Controlled type 2 diabetes mellitus without complication, without long-term current use of insulin ([place]) 03/11/2016  . Bipolar 1 disorder, mixed, moderate ([place]) 03/11/2016  . COPD exacerbation ([place]) 03/11/2016  . Insomnia 03/11/2016  . Hyperlipidemia associated with type 2 diabetes mellitus ([place]) 03/11/2016  . No energy 03/11/2016  . Right shoulder pain 03/11/2016  . Atypical chest pain 03/11/2016  . Bipolar 1 disorder, mixed ([place]) 10/03/2011    Class: Chronic  . ANKLE SPRAIN, RIGHT 09/23/2010  . COUGH 06/27/2010  . ALLERGIC RHINITIS 06/19/2010  . G E R D 06/19/2010  . LUMBAR STRAIN, ACUTE 05/25/2010  . MYALGIA  03/28/2010  . DIABETES MELLITUS, TYPE II 03/11/2010  . Hyperlipidemia 03/11/2010  . Musculoskeletal disease 03/11/2010    Past Surgical History:  Procedure Laterality Date  . ABDOMINAL HYSTERECTOMY    . APPENDECTOMY    . RIGHT OOPHORECTOMY      OB History   None      Home Medications    Prior to Admission medications   Medication Sig Start Date End Date Taking? Authorizing Provider  albuterol (PROVENTIL HFA;VENTOLIN HFA) 108 (90 Base) MCG/ACT inhaler Inhale 2 puffs into the lungs every 6 (six) hours as needed for wheezing or shortness of breath. 03/11/16   Breeback, Jade L, PA-C  alprazolam (XANAX) 2 MG tablet Take 2 mg by mouth 4 (four) times daily.    [provider]  AMBULATORY NON FORMULARY MEDICATION One touch test strips and lancets. 06/26/16   Breeback, Royetta Car, PA-C  AMBULATORY NON FORMULARY MEDICATION One touch Delica 22G Lancets [id number] Use to test blood sugar three times daily Fax:[phone] 11/25/16   Breeback, Jade L, PA-C  benzonatate (TESSALON) 200 MG capsule Take one cap by mouth at bedtime as needed for cough.  May repeat in 4 to 6 hours 12/24/17   Kandra Nicolas, MD  Blood Glucose Monitoring Suppl (ONE TOUCH ULTRA 2) w/Device KIT Check fasting blood sugar every morning and 2 hours after one meal of the day. 01/14/17   Breeback, Royetta Car, PA-C  budesonide-formoterol (SYMBICORT) 160-4.5 MCG/ACT inhaler Inhale 2 puffs into the lungs 2 (two) times daily. 03/11/16  Breeback, Jade L, PA-C  cetirizine (ZYRTEC) 10 MG tablet Take 1 tablet (10 mg total) by mouth daily. 03/11/16   Donella Stade, PA-C  clindamycin (CLEOCIN) 300 MG capsule Take one cap by mouth every 8 hours 03/23/18   Kandra Nicolas, MD  diclofenac (VOLTAREN) 75 MG EC tablet Take 1 tablet (75 mg total) by mouth 2 (two) times daily. 09/19/16   Breeback, Royetta Car, PA-C  diphenoxylate-atropine (LOMOTIL) 2.5-0.025 MG tablet Take 1 tablet by mouth 4 (four) times daily as needed for diarrhea or loose  stools. 03/23/18   Kandra Nicolas, MD  fluticasone (FLONASE) 50 MCG/ACT nasal spray Place 2 sprays into both nostrils daily. 03/11/16   Breeback, Jade L, PA-C  furosemide (LASIX) 40 MG tablet TAKE 1 TABLET BY MOUTH DAILY AS NEEDED 07/18/16   Breeback, Jade L, PA-C  glipiZIDE (GLUCOTROL) 10 MG tablet Take 1 tablet (10 mg total) by mouth daily before breakfast. MUST make appointment for future refills. 03/24/17   Breeback, Jade L, PA-C  ketorolac (TORADOL) 10 MG tablet Take 1 tablet (10 mg total) by mouth every 8 (eight) hours as needed for moderate pain. 03/23/18   Kandra Nicolas, MD  Lancets [facility] ULTRASOFT) lancets Check fasting blood sugar every morning and 2 hours after one meal of the day. 01/14/17   Breeback, Royetta Car, PA-C  metFORMIN (GLUCOPHAGE) 500 MG tablet Take 2 tablets (1,000 mg total) by mouth 2 (two) times daily with a meal. NEED FOLLOW UP APPOINTMENT FOR MORE REFILLS 12/02/16   Breeback, Jade L, PA-C  metFORMIN (GLUCOPHAGE) 500 MG tablet Take 2 tablets (1,000 mg total) by mouth 2 (two) times daily with a meal. NEED FOLLOW UP APPOINTMENT FOR MORE REFILLS 02/04/17   Breeback, Jade L, PA-C  ondansetron (ZOFRAN-ODT) 8 MG disintegrating tablet Take 1 tablet (8 mg total) by mouth every 8 (eight) hours as needed for nausea or vomiting. 09/02/16   Emeterio Reeve, DO  ONE TOUCH ULTRA TEST test strip USE TO TEST BLOOD SUGAR FOR A MAXIMUM OF 4 TIMES DAILY 01/08/17   Breeback, Jade L, PA-C  predniSONE (DELTASONE) 20 MG tablet Take one tab by mouth twice daily for 4 days, then one daily. Take with food. 03/23/18   Kandra Nicolas, MD  promethazine (PHENERGAN) 25 MG suppository Place 1 suppository (25 mg total) rectally every 6 (six) hours as needed for nausea. 03/23/18 03/23/19  Kandra Nicolas, MD  promethazine (PHENERGAN) 25 MG tablet Take 1 tablet (25 mg total) by mouth every 6 (six) hours as needed for nausea or vomiting. 03/23/18   Kandra Nicolas, MD  traZODone (DESYREL) 150 MG tablet Take 150 mg by  mouth at bedtime.    [provider]  Vitamin D, Ergocalciferol, (DRISDOL) 50000 units CAPS capsule Take 1 capsule (50,000 Units total) by mouth every 7 (seven) days. 03/12/16   Donella Stade, PA-C    Family History Family History  Problem Relation Age of Onset  . Alcohol abuse Father   . Hypertension Father   . Diabetes Father   . Aneurysm Father   . COPD Father   . COPD Mother   . Colon cancer Mother     Social History Social History   Tobacco Use  . Smoking status: Never Smoker  . Smokeless tobacco: Never Used  Substance Use Topics  . Alcohol use: Yes    Comment: socially  . Drug use: No     Allergies   Celebrex [celecoxib]; Ciprofloxacin; Darvon; Hydrocodone-acetaminophen; Morphine  sulfate; Oxycodone-acetaminophen; Phenergan [promethazine hcl]; Propoxyphene hcl; Propoxyphene n-acetaminophen; Sulfonamide derivatives; and Tramadol hcl   Review of Systems Review of Systems + sore throat + cough No pleuritic pain No wheezing + nasal congestion + post-nasal drainage No sinus pain/pressure No itchy/red eyes ? earache No hemoptysis No SOB No fever, + chills No nausea No vomiting No abdominal pain No diarrhea No urinary symptoms No skin rash + fatigue No myalgias + headache Used OTC meds without relief  Physical Exam Triage Vital Signs ED Triage Vitals  Enc Vitals Group     BP 03/23/18 1502 118/78     Pulse Rate 03/23/18 1502 99     Resp 03/23/18 1502 18     Temp 03/23/18 1502 98.2 F (36.8 C)     Temp Source 03/23/18 1502 Oral     SpO2 03/23/18 1502 98 %     Weight 03/23/18 1510 133 lb (60.3 kg)     Height 03/23/18 1510 _0  (1.727 m)     Head Circumference --      Peak Flow --      Pain Score 03/23/18 1502 0     Pain Loc --      Pain Edu? --      Excl. in [place]? --    No data found.  Updated Vital Signs BP 118/78 (BP Location: Right Arm)   Pulse 99   Temp 98.2 F (36.8 C) (Oral)   Resp 18   Ht _1  (1.727 m)   Wt 133 lb  (60.3 kg)   SpO2 98%   BMI 20.22 kg/m   Visual Acuity Right Eye Distance:   Left Eye Distance:   Bilateral Distance:    Right Eye Near:   Left Eye Near:    Bilateral Near:     Physical Exam Nursing notes and Vital Signs reviewed. Appearance:  Patient appears stated age, and in no acute distress Eyes:  Pupils are equal, round, and reactive to light and accomodation.  Extraocular movement is intact.  Conjunctivae are not inflamed.  Left upper and lower lids are mildly swollen and tender to palpation.  Ears:  Canals normal.  Tympanic membranes normal. There is distinct tenderness over both temporomandibular joints.  Palpation there recreates her pain.   Nose:  Mildly congested turbinates.  No sinus tenderness.   Mouth:  moist mucous membranes  Pharynx:  Normal Neck:  Supple.  Enlarged posterior/lateral nodes are palpated bilaterally, tender to palpation on the left.   Lungs:  Clear to auscultation.  Breath sounds are equal.  Moving air well. Heart:  Regular rate and rhythm without murmurs, rubs, or gallops.  Abdomen:  Nontender without masses or hepatosplenomegaly.  Bowel sounds are present.  No CVA or flank tenderness.  Extremities:  No edema.  Skin:  No rash present.    UC Treatments / Results  Labs (all labs ordered are listed, but only abnormal results are displayed) Labs Reviewed - No data to display  EKG None  Radiology No results found.  Procedures Procedures (including critical care time)  Medications Ordered in UC Medications  ketorolac (TORADOL) injection 60 mg (has no administration in time range)  promethazine (PHENERGAN) injection 50 mg (has no administration in time range)    Initial Impression / Assessment and Plan / UC Course  I have reviewed the triage vital signs and the nursing notes.  Pertinent labs & imaging results that were available during my care of the patient were reviewed by me and considered in  my medical decision making (see chart for  details).    Administered Toradol 6m IM; Administered Phenergan 518mIM. For TMJ pain, begin prednisone burst/taper.  Begin clindamycin for preseptal cellulitis. Administered Phenergan IM and Toradol IM at patient's request. Refill Lomotil, Phenergan, and Toradol at patient's request.  Final Clinical Impressions(s) / UC Diagnoses   Final diagnoses:  Viral URI with cough  Non-intractable vomiting with nausea, unspecified vomiting type  Preseptal cellulitis of left eye  TMJ pain dysfunction syndrome     Discharge Instructions      Apply ice to the painful jaw joints ? Put ice in a plastic bag. ? Place a towel between your skin and the bag. ? Leave the ice on for 15 minutes, 2-3 times a day. ?  Take plain guaifenesin (120057mxtended release tabs such as Mucinex) twice daily, with plenty of water, for cough and congestion.  May add Pseudoephedrine (82m46mne or two every 4 to 6 hours) for sinus congestion.  Get adequate rest.   May use Afrin nasal spray (or generic oxymetazoline) each morning for about 5 days and then discontinue.  Also recommend using saline nasal spray several times daily and saline nasal irrigation (AYR is a common brand).  Use Flonase nasal spray each morning after using Afrin nasal spray and saline nasal irrigation. Try warm salt water gargles for sore throat.  Stop all antihistamines for now, and other non-prescription cough/cold preparations.        ED Prescriptions    Medication Sig Dispense Auth. Provider   clindamycin (CLEOCIN) 300 MG capsule Take one cap by mouth every 8 hours 21 capsule BeesKandra Nicolas   diphenoxylate-atropine (LOMOTIL) 2.5-0.025 MG tablet Take 1 tablet by mouth 4 (four) times daily as needed for diarrhea or loose stools. 20 tablet BeesKandra Nicolas   promethazine (PHENERGAN) 25 MG tablet Take 1 tablet (25 mg total) by mouth every 6 (six) hours as needed for nausea or vomiting. 15 tablet BeesKandra Nicolas   promethazine  (PHENERGAN) 25 MG suppository Place 1 suppository (25 mg total) rectally every 6 (six) hours as needed for nausea. 15 suppository BeesKandra Nicolas   ketorolac (TORADOL) 10 MG tablet Take 1 tablet (10 mg total) by mouth every 8 (eight) hours as needed for moderate pain. 20 tablet BeesKandra Nicolas   predniSONE (DELTASONE) 20 MG tablet Take one tab by mouth twice daily for 4 days, then one daily. Take with food. 11 tablet BeesKandra Nicolas        BeesKandra Nicolas 03/25/18 [id number]

## 2018-03-23 NOTE — ED Triage Notes (Signed)
Pt c/o LT eye and ear pain, HA, runny nose, temp 102 last night, LT side sinus pain, vomiting and diarrhea x yesterday morning.

## 2018-06-10 ENCOUNTER — Encounter: Payer: Self-pay | Admitting: Emergency Medicine

## 2018-06-10 ENCOUNTER — Emergency Department (INDEPENDENT_AMBULATORY_CARE_PROVIDER_SITE_OTHER)
Admission: EM | Admit: 2018-06-10 | Discharge: 2018-06-10 | Disposition: A | Discharge status: Home / Self Care | Payer: Medicare Other | Source: Home / Self Care | Attending: Emergency Medicine | Admitting: Emergency Medicine

## 2018-06-10 ENCOUNTER — Other Ambulatory Visit: Payer: Self-pay

## 2018-06-10 DIAGNOSIS — R112 Nausea with vomiting, unspecified: Secondary | ICD-10-CM | POA: Diagnosis not present

## 2018-06-10 DIAGNOSIS — R197 Diarrhea, unspecified: Secondary | ICD-10-CM

## 2018-06-10 DIAGNOSIS — G43001 Migraine without aura, not intractable, with status migrainosus: Secondary | ICD-10-CM

## 2018-06-10 LAB — POCT CBC W AUTO DIFF (K'VILLE URGENT CARE)

## 2018-06-10 LAB — POCT FASTING CBG KUC MANUAL ENTRY: POCT Glucose (KUC): 240 mg/dL — AB (ref 70–99)

## 2018-06-10 MED ORDER — DIPHENOXYLATE-ATROPINE 2.5-0.025 MG PO TABS: 1.0000 | ORAL_TABLET | Freq: Three times a day (TID) | ORAL | 0 refills | Status: DC | PRN

## 2018-06-10 MED ORDER — PROMETHAZINE HCL 25 MG/ML IJ SOLN
25.0000 mg | INTRAMUSCULAR | Status: AC
Start: 2018-06-10 — End: 2018-06-10
  Administered 2018-06-10: 25 mg via INTRAMUSCULAR

## 2018-06-10 MED ORDER — KETOROLAC TROMETHAMINE 60 MG/2ML IM SOLN
60.0000 mg | Freq: Once | INTRAMUSCULAR | Status: AC
Start: 2018-06-10 — End: 2018-06-10
  Administered 2018-06-10: 60 mg via INTRAMUSCULAR

## 2018-06-10 MED ORDER — PROMETHAZINE HCL 25 MG RE SUPP: 25.0000 mg | Freq: Three times a day (TID) | RECTAL | 0 refills | Status: DC | PRN

## 2018-06-10 MED ORDER — PROMETHAZINE HCL 25 MG PO TABS: 25.0000 mg | ORAL_TABLET | Freq: Three times a day (TID) | ORAL | 0 refills | Status: DC | PRN

## 2018-06-10 NOTE — ED Provider Notes (Signed)
[name]    CSN: [id number] Arrival date & time: 06/10/18  1029     History   Chief Complaint Chief Complaint  Patient presents with  . Emesis   Patient was driven here by her fianc HPI Brittney Gonzalez is a 57 y.o. female.   HPI Complains of 3 days of fatigue, decreased appetite, then yesterday started vomiting 3 times a day for the past 24 hours, she states had low-grade fever, with several loose watery stools today.  No blood in the vomit or stool.  Denies any significant abdominal pain.  She states she feels she has viral infection and also acute recurrence of her chronic migraine headaches.  Left parietal headache, moderate intensity with mild photophobia with nausea.  Improves when she is in a dark room or can sleep. She has history of type 2 diabetes, unsure of compliance, but she states she checked her blood sugar this morning that was around 200. She denies syncope or focal neurologic symptoms or focal weakness or numbness.  Denies visual change.  Denies chest pain or shortness of breath.  Denies dysuria or urinary frequency.  I reviewed her past medical history in the chart, and she has presented to our urgent care several times over the past 6 months with some similar symptoms. Today, she specifically requests a Z-Pak, prescription for #25 of Lomotil, and prescriptions for oral Phenergan and Phenergan suppositories, which help her nausea and vomiting when she gets a migraine flareup.  She states that Phenergan and Toradol shot always helps to break migraine headaches such as this.  I asked her about Zofran, and she states she refuses to take Zofran, because it "never works" Past Medical History:  Diagnosis Date  . Anxiety   . Bipolar disorder ([place])   . Diabetes mellitus type II   . GERD (gastroesophageal reflux disease)   . Hyperlipemia   . Hypertension   . TIA (transient ischemic attack)     Patient Active Problem List   Diagnosis Date Noted  .  Diabetes type 2, uncontrolled ([place]) 09/01/2016  . Marijuana use 07/04/2016  . Cystocele 07/01/2016  . Vaginal irritation 07/01/2016  . Decreased libido 05/14/2016  . PTSD (post-traumatic stress disorder) 05/14/2016  . Fatty liver disease, nonalcoholic 18/56/3149  . Vitamin D deficiency 03/12/2016  . Elevated liver enzymes 03/12/2016  . Elevated TSH 03/12/2016  . Loose stools 03/12/2016  . Controlled type 2 diabetes mellitus without complication, without long-term current use of insulin ([place]) 03/11/2016  . Bipolar 1 disorder, mixed, moderate ([place]) 03/11/2016  . COPD exacerbation ([place]) 03/11/2016  . Insomnia 03/11/2016  . Hyperlipidemia associated with type 2 diabetes mellitus ([place]) 03/11/2016  . No energy 03/11/2016  . Right shoulder pain 03/11/2016  . Atypical chest pain 03/11/2016  . Bipolar 1 disorder, mixed ([place]) 10/03/2011    Class: Chronic  . ANKLE SPRAIN, RIGHT 09/23/2010  . COUGH 06/27/2010  . ALLERGIC RHINITIS 06/19/2010  . G E R D 06/19/2010  . LUMBAR STRAIN, ACUTE 05/25/2010  . MYALGIA 03/28/2010  . DIABETES MELLITUS, TYPE II 03/11/2010  . Hyperlipidemia 03/11/2010  . Musculoskeletal disease 03/11/2010    Past Surgical History:  Procedure Laterality Date  . ABDOMINAL HYSTERECTOMY    . APPENDECTOMY    . RIGHT OOPHORECTOMY      OB History   None      Home Medications    Prior to Admission medications   Medication Sig Start Date End Date Taking? Authorizing Provider  albuterol (PROVENTIL HFA;VENTOLIN  HFA) 108 (90 Base) MCG/ACT inhaler Inhale 2 puffs into the lungs every 6 (six) hours as needed for wheezing or shortness of breath. 03/11/16   Breeback, Jade L, PA-C  alprazolam (XANAX) 2 MG tablet Take 2 mg by mouth 4 (four) times daily.    [provider]  AMBULATORY NON FORMULARY MEDICATION One touch test strips and lancets. 06/26/16   Breeback, Royetta Car, PA-C  AMBULATORY NON FORMULARY MEDICATION One touch Delica 40G Lancets [id number] Use to test  blood sugar three times daily [phone] 11/25/16   Breeback, Jade L, PA-C  Blood Glucose Monitoring Suppl (ONE TOUCH ULTRA 2) w/Device KIT Check fasting blood sugar every morning and 2 hours after one meal of the day. 01/14/17   Breeback, Royetta Car, PA-C  budesonide-formoterol (SYMBICORT) 160-4.5 MCG/ACT inhaler Inhale 2 puffs into the lungs 2 (two) times daily. 03/11/16   Breeback, Jade L, PA-C  cetirizine (ZYRTEC) 10 MG tablet Take 1 tablet (10 mg total) by mouth daily. 03/11/16   Donella Stade, PA-C  clindamycin (CLEOCIN) 300 MG capsule Take one cap by mouth every 8 hours 03/23/18   Kandra Nicolas, MD  diclofenac (VOLTAREN) 75 MG EC tablet Take 1 tablet (75 mg total) by mouth 2 (two) times daily. 09/19/16   Breeback, Royetta Car, PA-C  diphenoxylate-atropine (LOMOTIL) 2.5-0.025 MG tablet Take 1 tablet by mouth 3 (three) times daily as needed for diarrhea or loose stools. This is for short-term use only 06/10/18   Jacqulyn Cane, MD  fluticasone [facility]) 50 MCG/ACT nasal spray Place 2 sprays into both nostrils daily. 03/11/16   Breeback, Jade L, PA-C  furosemide (LASIX) 40 MG tablet TAKE 1 TABLET BY MOUTH DAILY AS NEEDED 07/18/16   Breeback, Jade L, PA-C  glipiZIDE (GLUCOTROL) 10 MG tablet Take 1 tablet (10 mg total) by mouth daily before breakfast. MUST make appointment for future refills. 03/24/17   Breeback, Jade L, PA-C  Lancets (ONETOUCH ULTRASOFT) lancets Check fasting blood sugar every morning and 2 hours after one meal of the day. 01/14/17   Breeback, Royetta Car, PA-C  metFORMIN (GLUCOPHAGE) 500 MG tablet Take 2 tablets (1,000 mg total) by mouth 2 (two) times daily with a meal. NEED FOLLOW UP APPOINTMENT FOR MORE REFILLS 12/02/16   Breeback, Jade L, PA-C  metFORMIN (GLUCOPHAGE) 500 MG tablet Take 2 tablets (1,000 mg total) by mouth 2 (two) times daily with a meal. NEED FOLLOW UP APPOINTMENT FOR MORE REFILLS 02/04/17   Breeback, Jade L, PA-C  ondansetron (ZOFRAN-ODT) 8 MG disintegrating tablet Take 1 tablet (8  mg total) by mouth every 8 (eight) hours as needed for nausea or vomiting. 09/02/16   Emeterio Reeve, DO  ONE TOUCH ULTRA TEST test strip USE TO TEST BLOOD SUGAR FOR A MAXIMUM OF 4 TIMES DAILY 01/08/17   Breeback, Jade L, PA-C  promethazine (PHENERGAN) 25 MG suppository Place 1 suppository (25 mg total) rectally every 8 (eight) hours as needed for nausea or vomiting. 06/10/18   Jacqulyn Cane, MD  promethazine (PHENERGAN) 25 MG tablet Take 1 tablet (25 mg total) by mouth every 8 (eight) hours as needed for nausea or vomiting. 06/10/18   Jacqulyn Cane, MD  traZODone (DESYREL) 150 MG tablet Take 150 mg by mouth at bedtime.    [provider]  Vitamin D, Ergocalciferol, (DRISDOL) 50000 units CAPS capsule Take 1 capsule (50,000 Units total) by mouth every 7 (seven) days. 03/12/16   Donella Stade, PA-C    Family History Family History  Problem Relation  Age of Onset  . Alcohol abuse Father   . Hypertension Father   . Diabetes Father   . Aneurysm Father   . COPD Father   . COPD Mother   . Colon cancer Mother     Social History Social History   Tobacco Use  . Smoking status: Never Smoker  . Smokeless tobacco: Never Used  Substance Use Topics  . Alcohol use: Yes    Comment: socially  . Drug use: No  Denies tobacco.  She denies drinking alcohol to excess   Allergies   Celebrex [celecoxib]; Ciprofloxacin; Darvon; Hydrocodone-acetaminophen; Morphine sulfate; Oxycodone-acetaminophen; Phenergan [promethazine hcl]; Propoxyphene hcl; Propoxyphene n-acetaminophen; Sulfonamide derivatives; and Tramadol hcl Carefully reviewed her drug allergies listed.  She has tolerated Phenergan without adverse effect, when she has taken it as prescribed.  Review of Systems Review of Systems Pertinent items noted in HPI and remainder of comprehensive ROS otherwise negative.   Physical Exam Triage Vital Signs ED Triage Vitals  Enc Vitals Group     BP 06/10/18 1131 (!) 82/58     Pulse Rate  06/10/18 1131 99     Resp --      Temp 06/10/18 1131 98 F (36.7 C)     Temp Source 06/10/18 1131 Oral     SpO2 06/10/18 1131 98 %     Weight 06/10/18 1132 134 lb (60.8 kg)     Height 06/10/18 1132 _0  (1.727 m)     Head Circumference --      Peak Flow --      Pain Score 06/10/18 1131 4     Pain Loc --      Pain Edu? --      Excl. in [place]? --    No data found.  Updated Vital Signs BP (!) 82/58 (BP Location: Right Arm)   Pulse 99   Temp 98 F (36.7 C) (Oral)   Ht _1  (1.727 m)   Wt 134 lb (60.8 kg)   SpO2 98%   BMI 20.37 kg/m   BP115/75 when rechecked by me towards the end of visit. Physical Exam  Constitutional: She appears well-developed and well-nourished. She appears distressed (Uncomfortable from headache.).  Alert, cooperative.  She appears uncomfortable from headache, mild photophobia.  She vomited once clearish vomitus here in exam room.  No acute cardiorespiratory distress.  HENT:  Head: Normocephalic and atraumatic. Head is without raccoon's eyes, without contusion, without right periorbital erythema and without left periorbital erythema.  Right Ear: External ear and ear canal normal. No drainage. Tympanic membrane is not erythematous.  Left Ear: External ear and ear canal normal. No drainage. Tympanic membrane is not erythematous.  Nose: Nose normal. Right sinus exhibits no maxillary sinus tenderness. Left sinus exhibits no maxillary sinus tenderness.  Mouth/Throat: Oropharynx is clear and moist and mucous membranes are normal. No oral lesions. No oropharyngeal exudate, posterior oropharyngeal edema or posterior oropharyngeal erythema.      No cranial bruits.  Normal temporal arteries.  Eyes: Pupils are equal, round, and reactive to light. Conjunctivae, EOM and lids are normal.  Fundoscopic exam:      The right eye shows no papilledema.       The left eye shows no papilledema.  Neck: Neck supple. Normal carotid pulses and no JVD present. Carotid bruit is not  present. No tracheal deviation present. No thyromegaly present.  Cardiovascular: Regular rhythm and normal heart sounds.  Pulmonary/Chest: Effort normal and breath sounds normal. No respiratory distress. She has no  wheezes. She has no rales.  Abdominal: Soft. She exhibits no abdominal bruit. Bowel sounds are increased. There is no tenderness. There is no rigidity, no guarding, no CVA tenderness, no tenderness at McBurney's point and negative Murphy's sign.  Musculoskeletal: She exhibits no edema or tenderness.  Neurological: She is alert. She has normal strength and normal reflexes. No cranial nerve deficit or sensory deficit. Coordination normal.  Reflex Scores:      Patellar reflexes are 2+ on the right side and 2+ on the left side. Skin: Skin is warm and dry. No rash noted.  Psychiatric: Her mood appears anxious (Mildly). Her speech is tangential. Thought content is not delusional. She expresses no homicidal and no suicidal ideation.  Nursing note and vitals reviewed.    UC Treatments / Results  Labs (all labs ordered are listed, but only abnormal results are displayed) Labs Reviewed  POCT FASTING CBG [facility] MANUAL ENTRY - Abnormal; Notable for the following components:      Result Value   POCT Glucose ([facility]) 240 (*)    All other components within normal limits  POCT CBC W AUTO DIFF ([facility])   CBC: Within normal limits.   WBC count 6.3, hemoglobin 14.7  EKG None  Radiology No results found.  Procedures Procedures (including critical care time)  Medications Ordered in UC Medications  ketorolac (TORADOL) injection 60 mg (60 mg Intramuscular Given 06/10/18 1226)  promethazine (PHENERGAN) injection 25 mg (25 mg Intramuscular Given 06/10/18 1226)    Initial Impression / Assessment and Plan / UC Course Likely has viral gastroenteritis as cause for nausea and vomiting and diarrhea, with overlapping symptoms consistent with acute episode of her chronic recurrent migraine  headaches. No meningeal signs, no evidence of any acute neurologic event. Although diabetes uncontrolled, no clinical evidence of DKA or dehydration as she is now tolerating p.o. clear liquids assessed.  No evidence of any acute cardiorespiratory event.  No evidence of acute abdomen.  After risk benefits alternatives discussed, she agreed with the following treatment provided: Toradol 60 mg IM and Phenergan 25 mg IM given.  She tolerated these well without side effects.    When rechecked 30 minutes later, her nausea and vomiting and headache were significantly improved.  She stated that she felt significantly better. She was able to tolerate 6 ounces of water without vomiting. Blood pressure improved to 115/75.  Pulse 80.  Respirations 16  I have reviewed the triage vital signs and the nursing notes.  Pertinent labs & imaging results that were available during my care of the patient were reviewed by me and considered in my medical decision making (see chart for details).  Final Clinical Impressions(s) / UC Diagnoses   Final diagnoses:  Nausea vomiting and diarrhea  Migraine without aura and with status migrainosus, not intractable  Discussed at length with patient.  Although she requested prescription for Z-Pak, I explained that would not be indicated, as she has no evidence of a bacterial infection. She had requested larger quantities of Lomotil, and I explained I am only prescribing a limited number because of risks of large quantities of Lomotil usage. An After Visit Summary was printed and given to the patient. Questions invited and answered. Upon discharge, she was able to ambulate without assistance.  She stated that she felt much better.    she will be driven home by her fianc.   Discharge Instructions     You likely have viral syndrome and migraine headache as cause for nausea  vomiting diarrhea. Your CBC was within normal limits, white blood cell count 6.3, hemoglobin  14.7.-Blood glucose 240, showing diabetes not controlled. Today in urgent care, we gave you a shot of Toradol and Phenergan. See prescriptions listed in this packet.  See instruction sheets attached. Drink plenty of clear liquids to avoid dehydration. You need to follow-up with your PCP tomorrow-very important.  If any severely worsening symptoms, go to emergency room    ED Prescriptions    Medication Sig Dispense Auth. Provider   diphenoxylate-atropine (LOMOTIL) 2.5-0.025 MG tablet  (Status: Discontinued) Take 1 tablet by mouth 3 (three) times daily as needed for diarrhea or loose stools. This is for short-term use only. 9 tablet Jacqulyn Cane, MD   promethazine (PHENERGAN) 25 MG suppository Place 1 suppository (25 mg total) rectally every 8 (eight) hours as needed for nausea or vomiting. 3 each Jacqulyn Cane, MD   promethazine (PHENERGAN) 25 MG tablet Take 1 tablet (25 mg total) by mouth every 8 (eight) hours as needed for nausea or vomiting. 12 tablet Jacqulyn Cane, MD   diphenoxylate-atropine (LOMOTIL) 2.5-0.025 MG tablet Take 1 tablet by mouth 3 (three) times daily as needed for diarrhea or loose stools. This is for short-term use only 9 tablet Jacqulyn Cane, MD     Controlled Substance Prescriptions [place] Controlled Substance Registry consulted? Yes, I have consulted the [place] Controlled Substances Registry for this patient, and feel the risk/benefit ratio today is favorable for proceeding with this prescription for a controlled substance.   Jacqulyn Cane, MD 06/10/18 [phone]

## 2018-06-10 NOTE — ED Triage Notes (Signed)
Emesis, diarrhea, chills, fever, headache, fatigue x 4 days

## 2018-06-10 NOTE — Discharge Instructions (Signed)
You likely have viral syndrome and migraine headache as cause for nausea vomiting diarrhea. Your CBC was within normal limits, white blood cell count 6.3, hemoglobin 14.7.-Blood glucose 240, showing diabetes not controlled. Today in urgent care, we gave you a shot of Toradol and Phenergan. See prescriptions listed in this packet.  See instruction sheets attached. Drink plenty of clear liquids to avoid dehydration. You need to follow-up with your PCP tomorrow-very important.  If any severely worsening symptoms, go to emergency room

## 2018-09-06 ENCOUNTER — Emergency Department (INDEPENDENT_AMBULATORY_CARE_PROVIDER_SITE_OTHER)
Admission: EM | Admit: 2018-09-06 | Discharge: 2018-09-06 | Disposition: A | Discharge status: Home / Self Care | Payer: Medicare Other | Source: Home / Self Care | Attending: Family Medicine | Admitting: Family Medicine

## 2018-09-06 ENCOUNTER — Other Ambulatory Visit: Payer: Self-pay

## 2018-09-06 ENCOUNTER — Encounter: Payer: Self-pay | Admitting: *Deleted

## 2018-09-06 DIAGNOSIS — R112 Nausea with vomiting, unspecified: Secondary | ICD-10-CM

## 2018-09-06 DIAGNOSIS — H6591 Unspecified nonsuppurative otitis media, right ear: Secondary | ICD-10-CM

## 2018-09-06 DIAGNOSIS — B9789 Other viral agents as the cause of diseases classified elsewhere: Secondary | ICD-10-CM | POA: Diagnosis not present

## 2018-09-06 DIAGNOSIS — J069 Acute upper respiratory infection, unspecified: Secondary | ICD-10-CM | POA: Diagnosis not present

## 2018-09-06 DIAGNOSIS — R197 Diarrhea, unspecified: Secondary | ICD-10-CM

## 2018-09-06 MED ORDER — PROMETHAZINE HCL 25 MG PO TABS: 25.0000 mg | ORAL_TABLET | Freq: Four times a day (QID) | ORAL | 0 refills | Status: DC | PRN

## 2018-09-06 MED ORDER — DIPHENOXYLATE-ATROPINE 2.5-0.025 MG PO TABS: 1.0000 | ORAL_TABLET | Freq: Four times a day (QID) | ORAL | 0 refills | Status: DC | PRN

## 2018-09-06 MED ORDER — KETOROLAC TROMETHAMINE 60 MG/2ML IM SOLN
60.0000 mg | Freq: Once | INTRAMUSCULAR | Status: AC
  Administered 2018-09-06: 60 mg via INTRAMUSCULAR

## 2018-09-06 MED ORDER — PROMETHAZINE HCL 25 MG/ML IJ SOLN: 50.0000 mg | Freq: Once | INTRAMUSCULAR | Status: DC

## 2018-09-06 MED ORDER — PROMETHAZINE HCL 25 MG/ML IJ SOLN
25.0000 mg | Freq: Once | INTRAMUSCULAR | Status: AC
  Administered 2018-09-06: 25 mg via INTRAMUSCULAR

## 2018-09-06 MED ORDER — KETOROLAC TROMETHAMINE 10 MG PO TABS: 10.0000 mg | ORAL_TABLET | Freq: Four times a day (QID) | ORAL | 0 refills | Status: DC | PRN

## 2018-09-06 MED ORDER — AMOXICILLIN-POT CLAVULANATE 875-125 MG PO TABS: 1.0000 | ORAL_TABLET | Freq: Two times a day (BID) | ORAL | 0 refills | Status: DC

## 2018-09-06 MED ORDER — BENZONATATE 200 MG PO CAPS: ORAL_CAPSULE | ORAL | 0 refills | Status: DC

## 2018-09-06 NOTE — Discharge Instructions (Addendum)
Begin clear liquids (Pedialyte while having diarrhea) until improved, then advance to a [employer] (Bananas, Rice, Applesauce, Toast).  Then gradually resume a regular diet when tolerated.  Avoid milk products until well.     Take plain guaifenesin (1200mg  extended release tabs such as Mucinex) twice daily, with plenty of water, for cough and congestion.  May add Pseudoephedrine (30mg , one or two every 4 to 6 hours) for sinus congestion.  Get adequate rest.   May use Afrin nasal spray (or generic oxymetazoline) each morning for about 5 days and then discontinue.  Also recommend using saline nasal spray several times daily and saline nasal irrigation (AYR is a common brand).   Try warm salt water gargles for sore throat.  Stop all antihistamines for now, and other non-prescription cough/cold preparations.   If symptoms become significantly worse during the night or over the weekend, proceed to the local emergency room.

## 2018-09-06 NOTE — ED Triage Notes (Signed)
Pt c/o vomiting, cough, sneezing, HA, diarrhea, and ears feel clogged x 1 day.

## 2018-09-06 NOTE — ED Provider Notes (Signed)
[name]    CSN: [id number] Arrival date & time: 09/06/18  1139     History   Chief Complaint Chief Complaint  Patient presents with  . Cough  . Emesis    HPI Brittney Gonzalez is a 57 y.o. female.   Yesterday patient developed typical cold-like symptoms including mild sore throat, sinus congestion, headache, fatigue, myalgias, chills, and cough.  She has developed a flare-up of irritable bowel syndrome with nausea/vomiting and diarrhea.  The history is provided by the patient.    Past Medical History:  Diagnosis Date  . Anxiety   . Bipolar disorder ([place])   . Diabetes mellitus type II   . GERD (gastroesophageal reflux disease)   . Hyperlipemia   . Hypertension   . TIA (transient ischemic attack)     Patient Active Problem List   Diagnosis Date Noted  . Diabetes type 2, uncontrolled ([place]) 09/01/2016  . Marijuana use 07/04/2016  . Cystocele 07/01/2016  . Vaginal irritation 07/01/2016  . Decreased libido 05/14/2016  . PTSD (post-traumatic stress disorder) 05/14/2016  . Fatty liver disease, nonalcoholic 42/87/6811  . Vitamin D deficiency 03/12/2016  . Elevated liver enzymes 03/12/2016  . Elevated TSH 03/12/2016  . Loose stools 03/12/2016  . Controlled type 2 diabetes mellitus without complication, without long-term current use of insulin ([place]) 03/11/2016  . Bipolar 1 disorder, mixed, moderate ([place]) 03/11/2016  . COPD exacerbation ([place]) 03/11/2016  . Insomnia 03/11/2016  . Hyperlipidemia associated with type 2 diabetes mellitus ([place]) 03/11/2016  . No energy 03/11/2016  . Right shoulder pain 03/11/2016  . Atypical chest pain 03/11/2016  . Bipolar 1 disorder, mixed ([place]) 10/03/2011    Class: Chronic  . ANKLE SPRAIN, RIGHT 09/23/2010  . COUGH 06/27/2010  . ALLERGIC RHINITIS 06/19/2010  . G E R D 06/19/2010  . LUMBAR STRAIN, ACUTE 05/25/2010  . MYALGIA 03/28/2010  . DIABETES MELLITUS, TYPE II 03/11/2010  . Hyperlipidemia 03/11/2010  .  Musculoskeletal disease 03/11/2010    Past Surgical History:  Procedure Laterality Date  . ABDOMINAL HYSTERECTOMY    . APPENDECTOMY    . RIGHT OOPHORECTOMY      OB History   None      Home Medications    Prior to Admission medications   Medication Sig Start Date End Date Taking? Authorizing Provider  albuterol (PROVENTIL HFA;VENTOLIN HFA) 108 (90 Base) MCG/ACT inhaler Inhale 2 puffs into the lungs every 6 (six) hours as needed for wheezing or shortness of breath. 03/11/16   Breeback, Jade L, PA-C  alprazolam (XANAX) 2 MG tablet Take 2 mg by mouth 4 (four) times daily.    [provider]  AMBULATORY NON FORMULARY MEDICATION One touch test strips and lancets. 06/26/16   Breeback, Royetta Car, PA-C  AMBULATORY NON FORMULARY MEDICATION One touch Delica 57W Lancets [id number] Use to test blood sugar three times daily [phone] 11/25/16   Breeback, Jade L, PA-C  amoxicillin-clavulanate (AUGMENTIN) 875-125 MG tablet Take 1 tablet by mouth 2 (two) times daily. Take with food 09/06/18   Kandra Nicolas, MD  benzonatate (TESSALON) 200 MG capsule Take one cap by mouth at bedtime as needed for cough.  May repeat in 4 to 6 hours 09/06/18   Kandra Nicolas, MD  Blood Glucose Monitoring Suppl (ONE TOUCH ULTRA 2) w/Device KIT Check fasting blood sugar every morning and 2 hours after one meal of the day. 01/14/17   Breeback, Jade L, PA-C  budesonide-formoterol (SYMBICORT) 160-4.5 MCG/ACT inhaler Inhale 2 puffs into  the lungs 2 (two) times daily. 03/11/16   Breeback, Jade L, PA-C  cetirizine (ZYRTEC) 10 MG tablet Take 1 tablet (10 mg total) by mouth daily. 03/11/16   Breeback, Royetta Car, PA-C  diclofenac (VOLTAREN) 75 MG EC tablet Take 1 tablet (75 mg total) by mouth 2 (two) times daily. 09/19/16   Breeback, Royetta Car, PA-C  diphenoxylate-atropine (LOMOTIL) 2.5-0.025 MG tablet Take 1 tablet by mouth 4 (four) times daily as needed for diarrhea or loose stools. 09/06/18 09/06/19  Kandra Nicolas,  MD  fluticasone (FLONASE) 50 MCG/ACT nasal spray Place 2 sprays into both nostrils daily. 03/11/16   Breeback, Jade L, PA-C  furosemide (LASIX) 40 MG tablet TAKE 1 TABLET BY MOUTH DAILY AS NEEDED 07/18/16   Breeback, Jade L, PA-C  glipiZIDE (GLUCOTROL) 10 MG tablet Take 1 tablet (10 mg total) by mouth daily before breakfast. MUST make appointment for future refills. 03/24/17   Breeback, Jade L, PA-C  ketorolac (TORADOL) 10 MG tablet Take 1 tablet (10 mg total) by mouth every 6 (six) hours as needed for moderate pain. 09/06/18   Kandra Nicolas, MD  Lancets [facility] ULTRASOFT) lancets Check fasting blood sugar every morning and 2 hours after one meal of the day. 01/14/17   Breeback, Royetta Car, PA-C  metFORMIN (GLUCOPHAGE) 500 MG tablet Take 2 tablets (1,000 mg total) by mouth 2 (two) times daily with a meal. NEED FOLLOW UP APPOINTMENT FOR MORE REFILLS 12/02/16   Breeback, Jade L, PA-C  metFORMIN (GLUCOPHAGE) 500 MG tablet Take 2 tablets (1,000 mg total) by mouth 2 (two) times daily with a meal. NEED FOLLOW UP APPOINTMENT FOR MORE REFILLS 02/04/17   Breeback, Jade L, PA-C  ondansetron (ZOFRAN-ODT) 8 MG disintegrating tablet Take 1 tablet (8 mg total) by mouth every 8 (eight) hours as needed for nausea or vomiting. 09/02/16   Emeterio Reeve, DO  ONE TOUCH ULTRA TEST test strip USE TO TEST BLOOD SUGAR FOR A MAXIMUM OF 4 TIMES DAILY 01/08/17   Breeback, Jade L, PA-C  promethazine (PHENERGAN) 25 MG tablet Take 1 tablet (25 mg total) by mouth every 6 (six) hours as needed for nausea or vomiting. 09/06/18   Kandra Nicolas, MD  traZODone (DESYREL) 150 MG tablet Take 150 mg by mouth at bedtime.    [provider]  Vitamin D, Ergocalciferol, (DRISDOL) 50000 units CAPS capsule Take 1 capsule (50,000 Units total) by mouth every 7 (seven) days. 03/12/16   Donella Stade, PA-C    Family History Family History  Problem Relation Age of Onset  . Alcohol abuse Father   . Hypertension Father   . Diabetes  Father   . Aneurysm Father   . COPD Father   . COPD Mother   . Colon cancer Mother     Social History Social History   Tobacco Use  . Smoking status: Never Smoker  . Smokeless tobacco: Never Used  Substance Use Topics  . Alcohol use: Yes    Comment: socially  . Drug use: No     Allergies   Celebrex [celecoxib]; Ciprofloxacin; Darvon; Hydrocodone-acetaminophen; Morphine sulfate; Oxycodone-acetaminophen; Phenergan [promethazine hcl]; Propoxyphene hcl; Propoxyphene n-acetaminophen; Sulfonamide derivatives; and Tramadol hcl   Review of Systems Review of Systems + sore throat + cough No pleuritic pain No wheezing + nasal congestion + post-nasal drainage + sinus pain/pressure No itchy/red eyes ? earache No hemoptysis No SOB No fever, + chills + nausea + vomiting No abdominal pain + diarrhea No urinary symptoms No skin rash +  fatigue + myalgias + headache Used OTC meds without relief   Physical Exam Triage Vital Signs ED Triage Vitals  Enc Vitals Group     BP 09/06/18 1227 (!) 156/75     Pulse Rate 09/06/18 1227 96     Resp 09/06/18 1227 18     Temp 09/06/18 1227 98.1 F (36.7 C)     Temp Source 09/06/18 1227 Oral     SpO2 09/06/18 1227 96 %     Weight 09/06/18 1228 136 lb (61.7 kg)     Height 09/06/18 1228 _0  (1.753 m)     Head Circumference --      Peak Flow --      Pain Score 09/06/18 1228 0     Pain Loc --      Pain Edu? --      Excl. in [place]? --    No data found.  Updated Vital Signs BP (!) 156/75 (BP Location: Right Arm)   Pulse 96   Temp 98.1 F (36.7 C) (Oral)   Resp 18   Ht _1  (1.753 m)   Wt 61.7 kg   SpO2 96%   BMI 20.08 kg/m   Visual Acuity Right Eye Distance:   Left Eye Distance:   Bilateral Distance:    Right Eye Near:   Left Eye Near:    Bilateral Near:     Physical Exam Nursing notes and Vital Signs reviewed. Appearance:  Patient appears stated age, and in no acute distress Eyes:  Pupils are equal, round, and  reactive to light and accomodation.  Extraocular movement is intact.  Conjunctivae are not inflamed  Ears:  Canals normal.  Tympanic membranes normal.  Nose:  Mildly congested turbinates.  No sinus tenderness.   Pharynx:  Normal; moist mucous membranes  Neck:  Supple.  Enlarged posterior/lateral nodes are palpated bilaterally, tender to palpation on the left.   Lungs:  Clear to auscultation.  Breath sounds are equal.  Moving air well. Heart:  Regular rate and rhythm without murmurs, rubs, or gallops.  Abdomen:  Nontender without masses or hepatosplenomegaly.  Bowel sounds are present.  No CVA or flank tenderness.  Extremities:  No edema.  Skin:  No rash present.    UC Treatments / Results  Labs (all labs ordered are listed, but only abnormal results are displayed) Labs Reviewed - No data to display  EKG None  Radiology No results found.  Procedures Procedures (including critical care time)  Medications Ordered in UC Medications  ketorolac (TORADOL) injection 60 mg (has no administration in time range)  promethazine (PHENERGAN) injection 25 mg (has no administration in time range)    Initial Impression / Assessment and Plan / UC Course  I have reviewed the triage vital signs and the nursing notes.  Pertinent labs & imaging results that were available during my care of the patient were reviewed by me and considered in my medical decision making (see chart for details).    Administered Toradol 48m IM and Phenergan 251mIM at patient's request.  Refill Toradol 1064mnd Phenergan 12.5mg65mD prn.  Refill Lomotil QID prn. Begin Augmentin.  Pres[facility] at bedtime for night-time cough.  Followup with Family Doctor if not improved in one week.  Final Clinical Impressions(s) / UC Diagnoses   Final diagnoses:  Viral URI with cough  Right otitis media with effusion  Nausea vomiting and diarrhea     Discharge Instructions  Begin  clear liquids (Pedialyte while having diarrhea) until improved, then advance to a [employer] (Bananas, Rice, Applesauce, Toast).  Then gradually resume a regular diet when tolerated.  Avoid milk products until well.     Take plain guaifenesin (1224m extended release tabs such as Mucinex) twice daily, with plenty of water, for cough and congestion.  May add Pseudoephedrine (328m one or two every 4 to 6 hours) for sinus congestion.  Get adequate rest.   May use Afrin nasal spray (or generic oxymetazoline) each morning for about 5 days and then discontinue.  Also recommend using saline nasal spray several times daily and saline nasal irrigation (AYR is a common brand).   Try warm salt water gargles for sore throat.  Stop all antihistamines for now, and other non-prescription cough/cold preparations.   If symptoms become significantly worse during the night or over the weekend, proceed to the local emergency room.       ED Prescriptions    Medication Sig Dispense Auth. Provider   amoxicillin-clavulanate (AUGMENTIN) 875-125 MG tablet Take 1 tablet by mouth 2 (two) times daily. Take with food 20 tablet BeKandra NicolasMD   diphenoxylate-atropine (LOMOTIL) 2.5-0.025 MG tablet Take 1 tablet by mouth 4 (four) times daily as needed for diarrhea or loose stools. 20 tablet BeKandra NicolasMD   promethazine (PHENERGAN) 25 MG tablet Take 1 tablet (25 mg total) by mouth every 6 (six) hours as needed for nausea or vomiting. 15 tablet BeKandra NicolasMD   ketorolac (TORADOL) 10 MG tablet Take 1 tablet (10 mg total) by mouth every 6 (six) hours as needed for moderate pain. 20 tablet BeKandra NicolasMD   benzonatate (TESSALON) 200 MG capsule Take one cap by mouth at bedtime as needed for cough.  May repeat in 4 to 6 hours 15 capsule BeAssunta FoundtIshmael HolterMD        BeKandra NicolasMD 09/08/18 2156

## 2019-01-06 ENCOUNTER — Emergency Department (INDEPENDENT_AMBULATORY_CARE_PROVIDER_SITE_OTHER)
Admission: EM | Admit: 2019-01-06 | Discharge: 2019-01-06 | Disposition: A | Discharge status: Home / Self Care | Payer: Medicare Other | Source: Home / Self Care | Attending: Family Medicine | Admitting: Family Medicine

## 2019-01-06 ENCOUNTER — Other Ambulatory Visit: Payer: Self-pay

## 2019-01-06 DIAGNOSIS — R197 Diarrhea, unspecified: Secondary | ICD-10-CM

## 2019-01-06 DIAGNOSIS — J111 Influenza due to unidentified influenza virus with other respiratory manifestations: Secondary | ICD-10-CM

## 2019-01-06 DIAGNOSIS — R112 Nausea with vomiting, unspecified: Secondary | ICD-10-CM

## 2019-01-06 DIAGNOSIS — R69 Illness, unspecified: Secondary | ICD-10-CM | POA: Diagnosis not present

## 2019-01-06 MED ORDER — KETOROLAC TROMETHAMINE 30 MG/ML IJ SOLN
30.0000 mg | Freq: Once | INTRAMUSCULAR | Status: AC
  Administered 2019-01-06: 30 mg via INTRAMUSCULAR

## 2019-01-06 MED ORDER — OSELTAMIVIR PHOSPHATE 75 MG PO CAPS: 75.0000 mg | ORAL_CAPSULE | Freq: Two times a day (BID) | ORAL | 0 refills | Status: DC

## 2019-01-06 MED ORDER — PROMETHAZINE HCL 25 MG/ML IJ SOLN
50.0000 mg | Freq: Once | INTRAMUSCULAR | Status: AC
  Administered 2019-01-06: 50 mg via INTRAMUSCULAR

## 2019-01-06 MED ORDER — PROMETHAZINE HCL 25 MG PO TABS: 25.0000 mg | ORAL_TABLET | Freq: Four times a day (QID) | ORAL | 0 refills | Status: DC | PRN

## 2019-01-06 MED ORDER — DIPHENOXYLATE-ATROPINE 2.5-0.025 MG PO TABS: 1.0000 | ORAL_TABLET | Freq: Four times a day (QID) | ORAL | 0 refills | Status: DC | PRN

## 2019-01-06 NOTE — Discharge Instructions (Addendum)
Begin clear liquids for about 24 hours (Pedialyte while having diarrhea), then gradually advance to a BRAT diet (bananas, rice, apple sauce, toast) when improving. Take plain guaifenesin (1200mg  extended release tabs such as Mucinex) twice daily, with plenty of water, for cough and congestion.   Get adequate rest.   Stop all antihistamines for now, and other non-prescription cough/cold preparations. May take Delsym Cough Suppressant at bedtime for nighttime cough.

## 2019-01-06 NOTE — ED Provider Notes (Signed)
[name]    CSN: [id number] Arrival date & time: 01/06/19  1245     History   Chief Complaint Chief Complaint  Patient presents with  . Sore Throat  . Emesis  . Diarrhea    HPI Brittney Gonzalez is a 58 y.o. female.   Complains of 2 day history flu-like illness including myalgias, headache, chills/sweats, fatigue, and cough.  Also has nasal congestion and sore throat.  Cough is non-productive and worse at night.  No pleuritic pain or shortness of breath but she feels tightness in her anterior chest.  She has not had a flu shot this season.  She has nausea/vomiting and diarrhea since onset of her symptoms, and notes that she almost always gets GI symptoms with onset of illness.  The history is provided by the patient.    Past Medical History:  Diagnosis Date  . Anxiety   . Bipolar disorder ([place])   . Diabetes mellitus type II   . GERD (gastroesophageal reflux disease)   . Hyperlipemia   . Hypertension   . TIA (transient ischemic attack)     Patient Active Problem List   Diagnosis Date Noted  . Diabetes type 2, uncontrolled ([place]) 09/01/2016  . Marijuana use 07/04/2016  . Cystocele 07/01/2016  . Vaginal irritation 07/01/2016  . Decreased libido 05/14/2016  . PTSD (post-traumatic stress disorder) 05/14/2016  . Fatty liver disease, nonalcoholic 80/99/8338  . Vitamin D deficiency 03/12/2016  . Elevated liver enzymes 03/12/2016  . Elevated TSH 03/12/2016  . Loose stools 03/12/2016  . Controlled type 2 diabetes mellitus without complication, without long-term current use of insulin ([place]) 03/11/2016  . Bipolar 1 disorder, mixed, moderate ([place]) 03/11/2016  . COPD exacerbation ([place]) 03/11/2016  . Insomnia 03/11/2016  . Hyperlipidemia associated with type 2 diabetes mellitus ([place]) 03/11/2016  . No energy 03/11/2016  . Right shoulder pain 03/11/2016  . Atypical chest pain 03/11/2016  . Bipolar 1 disorder, mixed ([place]) 10/03/2011    Class: Chronic  . ANKLE  SPRAIN, RIGHT 09/23/2010  . COUGH 06/27/2010  . ALLERGIC RHINITIS 06/19/2010  . G E R D 06/19/2010  . LUMBAR STRAIN, ACUTE 05/25/2010  . MYALGIA 03/28/2010  . DIABETES MELLITUS, TYPE II 03/11/2010  . Hyperlipidemia 03/11/2010  . Musculoskeletal disease 03/11/2010    Past Surgical History:  Procedure Laterality Date  . ABDOMINAL HYSTERECTOMY    . APPENDECTOMY    . RIGHT OOPHORECTOMY      OB History   No obstetric history on file.      Home Medications    Prior to Admission medications   Medication Sig Start Date End Date Taking? Authorizing Provider  albuterol (PROVENTIL HFA;VENTOLIN HFA) 108 (90 Base) MCG/ACT inhaler Inhale 2 puffs into the lungs every 6 (six) hours as needed for wheezing or shortness of breath. 03/11/16   Breeback, Jade L, PA-C  alprazolam (XANAX) 2 MG tablet Take 2 mg by mouth 4 (four) times daily.    [provider]  AMBULATORY NON FORMULARY MEDICATION One touch test strips and lancets. 06/26/16   Breeback, Royetta Car, PA-C  AMBULATORY NON FORMULARY MEDICATION One touch Delica 25K Lancets [id number]:P37.9 Use to test blood sugar three times daily Fax:[phone] 11/25/16   Breeback, Jade L, PA-C  amoxicillin-clavulanate (AUGMENTIN) 875-125 MG tablet Take 1 tablet by mouth 2 (two) times daily. Take with food 09/06/18   Kandra Nicolas, MD  benzonatate (TESSALON) 200 MG capsule Take one cap by mouth at bedtime as needed for cough.  May  repeat in 4 to 6 hours 09/06/18   Kandra Nicolas, MD  Blood Glucose Monitoring Suppl (ONE TOUCH ULTRA 2) w/Device KIT Check fasting blood sugar every morning and 2 hours after one meal of the day. 01/14/17   Breeback, Royetta Car, PA-C  budesonide-formoterol (SYMBICORT) 160-4.5 MCG/ACT inhaler Inhale 2 puffs into the lungs 2 (two) times daily. 03/11/16   Breeback, Jade L, PA-C  cetirizine (ZYRTEC) 10 MG tablet Take 1 tablet (10 mg total) by mouth daily. 03/11/16   Breeback, Royetta Car, PA-C  diclofenac (VOLTAREN) 75 MG EC tablet Take 1  tablet (75 mg total) by mouth 2 (two) times daily. 09/19/16   Breeback, Royetta Car, PA-C  diphenoxylate-atropine (LOMOTIL) 2.5-0.025 MG tablet Take 1 tablet by mouth 4 (four) times daily as needed for diarrhea or loose stools. 01/06/19 01/06/20  Kandra Nicolas, MD  fluticasone (FLONASE) 50 MCG/ACT nasal spray Place 2 sprays into both nostrils daily. 03/11/16   Breeback, Jade L, PA-C  furosemide (LASIX) 40 MG tablet TAKE 1 TABLET BY MOUTH DAILY AS NEEDED 07/18/16   Breeback, Jade L, PA-C  glipiZIDE (GLUCOTROL) 10 MG tablet Take 1 tablet (10 mg total) by mouth daily before breakfast. MUST make appointment for future refills. 03/24/17   Breeback, Jade L, PA-C  ketorolac (TORADOL) 10 MG tablet Take 1 tablet (10 mg total) by mouth every 6 (six) hours as needed for moderate pain. 09/06/18   Kandra Nicolas, MD  Lancets [facility] ULTRASOFT) lancets Check fasting blood sugar every morning and 2 hours after one meal of the day. 01/14/17   Breeback, Royetta Car, PA-C  metFORMIN (GLUCOPHAGE) 500 MG tablet Take 2 tablets (1,000 mg total) by mouth 2 (two) times daily with a meal. NEED FOLLOW UP APPOINTMENT FOR MORE REFILLS 12/02/16   Breeback, Jade L, PA-C  metFORMIN (GLUCOPHAGE) 500 MG tablet Take 2 tablets (1,000 mg total) by mouth 2 (two) times daily with a meal. NEED FOLLOW UP APPOINTMENT FOR MORE REFILLS 02/04/17   Breeback, Jade L, PA-C  ondansetron (ZOFRAN-ODT) 8 MG disintegrating tablet Take 1 tablet (8 mg total) by mouth every 8 (eight) hours as needed for nausea or vomiting. 09/02/16   Emeterio Reeve, DO  ONE TOUCH ULTRA TEST test strip USE TO TEST BLOOD SUGAR FOR A MAXIMUM OF 4 TIMES DAILY 01/08/17   Breeback, Jade L, PA-C  oseltamivir (TAMIFLU) 75 MG capsule Take 1 capsule (75 mg total) by mouth every 12 (twelve) hours. 01/06/19   Kandra Nicolas, MD  promethazine (PHENERGAN) 25 MG tablet Take 1 tablet (25 mg total) by mouth every 6 (six) hours as needed for nausea or vomiting. 01/06/19   Kandra Nicolas, MD    traZODone (DESYREL) 150 MG tablet Take 150 mg by mouth at bedtime.    [provider]  Vitamin D, Ergocalciferol, (DRISDOL) 50000 units CAPS capsule Take 1 capsule (50,000 Units total) by mouth every 7 (seven) days. 03/12/16   Donella Stade, PA-C    Family History Family History  Problem Relation Age of Onset  . Alcohol abuse Father   . Hypertension Father   . Diabetes Father   . Aneurysm Father   . COPD Father   . COPD Mother   . Colon cancer Mother     Social History Social History   Tobacco Use  . Smoking status: Never Smoker  . Smokeless tobacco: Never Used  Substance Use Topics  . Alcohol use: Yes    Comment: socially  . Drug use: No  Allergies   Celebrex [celecoxib]; Ciprofloxacin; Darvon; Hydrocodone-acetaminophen; Morphine sulfate; Oxycodone-acetaminophen; Phenergan [promethazine hcl]; Propoxyphene hcl; Propoxyphene n-acetaminophen; Sulfonamide derivatives; and Tramadol hcl   Review of Systems Review of Systems + sore throat + cough No pleuritic pain, but feels tight in anterior chest ? wheezing + nasal congestion + post-nasal drainage No sinus pain/pressure No itchy/red eyes ? earache No hemoptysis No SOB ? fever, + chills/sweats + nausea + vomiting No abdominal pain + diarrhea No urinary symptoms No skin rash + fatigue + myalgias + headache Used OTC meds without relief   Physical Exam Triage Vital Signs ED Triage Vitals  Enc Vitals Group     BP 01/06/19 1019 116/79     Pulse Rate 01/06/19 1019 93     Resp 01/06/19 1019 16     Temp 01/06/19 1019 (!) 97.5 F (36.4 C)     Temp Source 01/06/19 1019 Oral     SpO2 01/06/19 1019 96 %     Weight 01/06/19 1024 129 lb (58.5 kg)     Height 01/06/19 1024 '5\' 8"'$  (1.727 m)     Head Circumference --      Peak Flow --      Pain Score 01/06/19 1024 0     Pain Loc --      Pain Edu? --      Excl. in [place]? --    No data found.  Updated Vital Signs BP 116/79 (BP Location: Right Arm)    Pulse 93   Temp (!) 97.5 F (36.4 C) (Oral)   Resp 16   Ht '5\' 8"'$  (1.727 m)   Wt 58.5 kg   SpO2 96%   BMI 19.61 kg/m   Visual Acuity Right Eye Distance:   Left Eye Distance:   Bilateral Distance:    Right Eye Near:   Left Eye Near:    Bilateral Near:     Physical Exam Nursing notes and Vital Signs reviewed. Appearance:  Patient appears stated age, and uncomfortable but in no acute distress Eyes:  Pupils are equal, round, and reactive to light and accomodation.  Extraocular movement is intact.  Conjunctivae are not inflamed  Ears:  Canals normal.  Tympanic membranes normal.  Nose:  Mildly congested turbinates.  No sinus tenderness.   Pharynx:  Normal; moist mucous membranes  Neck:  Supple.  Enlarged posterior/lateral nodes are palpated bilaterally, tender to palpation on the left.   Lungs:  Clear to auscultation.  Breath sounds are equal.  Moving air well. Heart:  Regular rate and rhythm without murmurs, rubs, or gallops.  Abdomen:  Nontender without masses or hepatosplenomegaly.  Bowel sounds are present.  No CVA or flank tenderness.  Extremities:  No edema.  Skin:  No rash present.    UC Treatments / Results  Labs (all labs ordered are listed, but only abnormal results are displayed) Labs Reviewed - No data to display  EKG None  Radiology No results found.  Procedures Procedures (including critical care time)  Medications Ordered in UC Medications  promethazine (PHENERGAN) injection 50 mg (has no administration in time range)  ketorolac (TORADOL) 30 MG/ML injection 30 mg (has no administration in time range)    Initial Impression / Assessment and Plan / UC Course  I have reviewed the triage vital signs and the nursing notes.  Pertinent labs & imaging results that were available during my care of the patient were reviewed by me and considered in my medical decision making (see chart for details).  Begin Tamiflu.  Administered Phenergan '50mg'$  IM at  patient's request.  Administered Toradol '30mg'$  IM. Rx for Phenergan '25mg'$  (#15, no refill).  Rx for Lomotil (#20, no refill) at patient's request. Followup with Family Doctor if not improved in one week.    Final Clinical Impressions(s) / UC Diagnoses   Final diagnoses:  Influenza-like illness  Nausea vomiting and diarrhea     Discharge Instructions     Begin clear liquids for about 24 hours (Pedialyte while having diarrhea), then gradually advance to a BRAT diet (bananas, rice, apple sauce, toast) when improving. Take plain guaifenesin ('1200mg'$  extended release tabs such as Mucinex) twice daily, with plenty of water, for cough and congestion.   Get adequate rest.   Stop all antihistamines for now, and other non-prescription cough/cold preparations. May take Delsym Cough Suppressant at bedtime for nighttime cough.       ED Prescriptions    Medication Sig Dispense Auth. Provider   diphenoxylate-atropine (LOMOTIL) 2.5-0.025 MG tablet Take 1 tablet by mouth 4 (four) times daily as needed for diarrhea or loose stools. 20 tablet Kandra Nicolas, MD   promethazine (PHENERGAN) 25 MG tablet Take 1 tablet (25 mg total) by mouth every 6 (six) hours as needed for nausea or vomiting. 15 tablet Kandra Nicolas, MD   oseltamivir (TAMIFLU) 75 MG capsule Take 1 capsule (75 mg total) by mouth every 12 (twelve) hours. 10 capsule Kandra Nicolas, MD         Kandra Nicolas, MD 01/06/19 1800

## 2019-01-06 NOTE — ED Triage Notes (Signed)
Pt c/o sore throat, cough, chest soreness, chills and vomiting/diarrhea since Tuesday. No OTC meds tried.

## 2019-02-23 ENCOUNTER — Other Ambulatory Visit: Payer: Self-pay

## 2019-02-23 ENCOUNTER — Emergency Department (INDEPENDENT_AMBULATORY_CARE_PROVIDER_SITE_OTHER)
Admission: EM | Admit: 2019-02-23 | Discharge: 2019-02-23 | Disposition: A | Discharge status: Home / Self Care | Payer: Medicare Other | Source: Home / Self Care | Attending: Family Medicine | Admitting: Family Medicine

## 2019-02-23 DIAGNOSIS — R197 Diarrhea, unspecified: Secondary | ICD-10-CM | POA: Diagnosis not present

## 2019-02-23 DIAGNOSIS — J111 Influenza due to unidentified influenza virus with other respiratory manifestations: Secondary | ICD-10-CM

## 2019-02-23 DIAGNOSIS — R112 Nausea with vomiting, unspecified: Secondary | ICD-10-CM | POA: Diagnosis not present

## 2019-02-23 DIAGNOSIS — R69 Illness, unspecified: Secondary | ICD-10-CM

## 2019-02-23 MED ORDER — KETOROLAC TROMETHAMINE 30 MG/ML IJ SOLN
30.0000 mg | Freq: Once | INTRAMUSCULAR | Status: AC
  Administered 2019-02-23: 30 mg via INTRAMUSCULAR

## 2019-02-23 MED ORDER — GUAIFENESIN-CODEINE 100-10 MG/5ML PO SOLN: ORAL | 0 refills | Status: DC

## 2019-02-23 MED ORDER — PROMETHAZINE HCL 12.5 MG PO TABS: 12.5000 mg | ORAL_TABLET | Freq: Four times a day (QID) | ORAL | 0 refills | Status: DC | PRN

## 2019-02-23 MED ORDER — AMOXICILLIN-POT CLAVULANATE ER 1000-62.5 MG PO TB12: 1.0000 | ORAL_TABLET | Freq: Two times a day (BID) | ORAL | 0 refills | Status: DC

## 2019-02-23 MED ORDER — PROMETHAZINE HCL 25 MG/ML IJ SOLN
12.5000 mg | Freq: Once | INTRAMUSCULAR | Status: AC
  Administered 2019-02-23: 11:00:00 12.5 mg via INTRAMUSCULAR

## 2019-02-23 NOTE — ED Triage Notes (Signed)
Pt stated that diarrhea and vomiting started on Saturday.  Has a frontal headache, nasal congestion, and facial pain.  Body aches, sneezing and SOB.

## 2019-02-23 NOTE — Discharge Instructions (Addendum)
Begin clear liquids (Pedialyte while having diarrhea) until improved, then advance to a [employer] (Bananas, Rice, Applesauce, Toast).  Then gradually resume a regular diet when tolerated.  Avoid milk products until well.    Take plain guaifenesin (1200mg  extended release tabs such as Mucinex) twice daily, with plenty of water, for cough and congestion.  Get adequate rest.   May use Afrin nasal spray (or generic oxymetazoline) each morning for about 5 days and then discontinue.  Also recommend using saline nasal spray several times daily and saline nasal irrigation (AYR is a common brand).  Use Flonase nasal spray each morning after using Afrin nasal spray and saline nasal irrigation. Try warm salt water gargles for sore throat.  Stop all antihistamines for now, and other non-prescription cough/cold preparations. May take Ibuprofen 200mg , 4 tabs every 8 hours with food for body aches, fever, etc.   If symptoms become significantly worse during the night or over the weekend, call local emergency room.

## 2019-02-23 NOTE — ED Provider Notes (Signed)
[name]    CSN: [id number] Arrival date & time: 02/23/19  0903     History   Chief Complaint Chief Complaint  Patient presents with  . Shortness of Breath  . Nausea  . Emesis  . Cough  . Headache    HPI Brittney Gonzalez is a 58 y.o. female.   Patient complains of onset of headache and fatigue 6 days ago.  The next day she developed a productive cough, fever to 102, nasal congestion, and myalgias.  She has developed progressive increase in wheezing and shortness of breath with activity.  She feels tight in her anterior chest, but no pleuritic pain.  The history is provided by the patient.    Past Medical History:  Diagnosis Date  . Anxiety   . Bipolar disorder ([place])   . Diabetes mellitus type II   . GERD (gastroesophageal reflux disease)   . Hyperlipemia   . Hypertension   . TIA (transient ischemic attack)     Patient Active Problem List   Diagnosis Date Noted  . Diabetes type 2, uncontrolled ([place]) 09/01/2016  . Marijuana use 07/04/2016  . Cystocele 07/01/2016  . Vaginal irritation 07/01/2016  . Decreased libido 05/14/2016  . PTSD (post-traumatic stress disorder) 05/14/2016  . Fatty liver disease, nonalcoholic 67/10/4579  . Vitamin D deficiency 03/12/2016  . Elevated liver enzymes 03/12/2016  . Elevated TSH 03/12/2016  . Loose stools 03/12/2016  . Controlled type 2 diabetes mellitus without complication, without long-term current use of insulin ([place]) 03/11/2016  . Bipolar 1 disorder, mixed, moderate ([place]) 03/11/2016  . COPD exacerbation ([place]) 03/11/2016  . Insomnia 03/11/2016  . Hyperlipidemia associated with type 2 diabetes mellitus ([place]) 03/11/2016  . No energy 03/11/2016  . Right shoulder pain 03/11/2016  . Atypical chest pain 03/11/2016  . Bipolar 1 disorder, mixed ([place]) 10/03/2011    Class: Chronic  . ANKLE SPRAIN, RIGHT 09/23/2010  . COUGH 06/27/2010  . ALLERGIC RHINITIS 06/19/2010  . G E R D 06/19/2010  . LUMBAR STRAIN, ACUTE  05/25/2010  . MYALGIA 03/28/2010  . DIABETES MELLITUS, TYPE II 03/11/2010  . Hyperlipidemia 03/11/2010  . Musculoskeletal disease 03/11/2010    Past Surgical History:  Procedure Laterality Date  . ABDOMINAL HYSTERECTOMY    . APPENDECTOMY    . RIGHT OOPHORECTOMY      OB History   No obstetric history on file.      Home Medications    Prior to Admission medications   Medication Sig Start Date End Date Taking? Authorizing Provider  albuterol (PROVENTIL HFA;VENTOLIN HFA) 108 (90 Base) MCG/ACT inhaler Inhale 2 puffs into the lungs every 6 (six) hours as needed for wheezing or shortness of breath. 03/11/16   Breeback, Jade L, PA-C  alprazolam (XANAX) 2 MG tablet Take 2 mg by mouth 4 (four) times daily.    [provider]  AMBULATORY NON FORMULARY MEDICATION One touch test strips and lancets. 06/26/16   Breeback, Royetta Car, PA-C  AMBULATORY NON FORMULARY MEDICATION One touch Delica 99I Lancets [id number]:J67.3 Use to test blood sugar three times daily Fax:[phone] 11/25/16   Breeback, Jade L, PA-C  amoxicillin-clavulanate (AUGMENTIN XR) 1000-62.5 MG 12 hr tablet Take 1 tablet by mouth 2 (two) times daily for 14 days. Take with food. 02/23/19 03/09/19  Kandra Nicolas, MD  benzonatate (TESSALON) 200 MG capsule Take one cap by mouth at bedtime as needed for cough.  May repeat in 4 to 6 hours 09/06/18   Kandra Nicolas, MD  Blood Glucose Monitoring Suppl (ONE TOUCH ULTRA 2) w/Device KIT Check fasting blood sugar every morning and 2 hours after one meal of the day. 01/14/17   Breeback, Royetta Car, PA-C  budesonide-formoterol (SYMBICORT) 160-4.5 MCG/ACT inhaler Inhale 2 puffs into the lungs 2 (two) times daily. 03/11/16   Breeback, Jade L, PA-C  cetirizine (ZYRTEC) 10 MG tablet Take 1 tablet (10 mg total) by mouth daily. 03/11/16   Breeback, Royetta Car, PA-C  diclofenac (VOLTAREN) 75 MG EC tablet Take 1 tablet (75 mg total) by mouth 2 (two) times daily. 09/19/16   Breeback, Royetta Car, PA-C   diphenoxylate-atropine (LOMOTIL) 2.5-0.025 MG tablet Take 1 tablet by mouth 4 (four) times daily as needed for diarrhea or loose stools. 01/06/19 01/06/20  Kandra Nicolas, MD  fluticasone (FLONASE) 50 MCG/ACT nasal spray Place 2 sprays into both nostrils daily. 03/11/16   Breeback, Jade L, PA-C  furosemide (LASIX) 40 MG tablet TAKE 1 TABLET BY MOUTH DAILY AS NEEDED 07/18/16   Breeback, Jade L, PA-C  glipiZIDE (GLUCOTROL) 10 MG tablet Take 1 tablet (10 mg total) by mouth daily before breakfast. MUST make appointment for future refills. 03/24/17   Breeback, Jade L, PA-C  guaiFENesin-codeine 100-10 MG/5ML syrup Take 38m by mouth at bedtime as needed for cough.  May repeat dose in 4 to 6 hours. 02/23/19   BKandra Nicolas MD  ketorolac (TORADOL) 10 MG tablet Take 1 tablet (10 mg total) by mouth every 6 (six) hours as needed for moderate pain. 09/06/18   BKandra Nicolas MD  Lancets ([facility]) lancets Check fasting blood sugar every morning and 2 hours after one meal of the day. 01/14/17   Breeback, JRoyetta Car PA-C  metFORMIN (GLUCOPHAGE) 500 MG tablet Take 2 tablets (1,000 mg total) by mouth 2 (two) times daily with a meal. NEED FOLLOW UP APPOINTMENT FOR MORE REFILLS 12/02/16   Breeback, Jade L, PA-C  metFORMIN (GLUCOPHAGE) 500 MG tablet Take 2 tablets (1,000 mg total) by mouth 2 (two) times daily with a meal. NEED FOLLOW UP APPOINTMENT FOR MORE REFILLS 02/04/17   Breeback, Jade L, PA-C  ondansetron (ZOFRAN-ODT) 8 MG disintegrating tablet Take 1 tablet (8 mg total) by mouth every 8 (eight) hours as needed for nausea or vomiting. 09/02/16   AEmeterio Reeve DO  ONE TOUCH ULTRA TEST test strip USE TO TEST BLOOD SUGAR FOR A MAXIMUM OF 4 TIMES DAILY 01/08/17   Breeback, Jade L, PA-C  oseltamivir (TAMIFLU) 75 MG capsule Take 1 capsule (75 mg total) by mouth every 12 (twelve) hours. 01/06/19   BKandra Nicolas MD  promethazine (PHENERGAN) 12.5 MG tablet Take 1 tablet (12.5 mg total) by mouth every 6 (six) hours  as needed for nausea or vomiting. 02/23/19   BKandra Nicolas MD  traZODone (DESYREL) 150 MG tablet Take 150 mg by mouth at bedtime.    [provider]  Vitamin D, Ergocalciferol, (DRISDOL) 50000 units CAPS capsule Take 1 capsule (50,000 Units total) by mouth every 7 (seven) days. 03/12/16   BDonella Stade PA-C    Family History Family History  Problem Relation Age of Onset  . Alcohol abuse Father   . Hypertension Father   . Diabetes Father   . Aneurysm Father   . COPD Father   . COPD Mother   . Colon cancer Mother     Social History Social History   Tobacco Use  . Smoking status: Never Smoker  . Smokeless tobacco: Never Used  Substance Use  Topics  . Alcohol use: Yes    Comment: socially  . Drug use: No     Allergies   Celebrex [celecoxib]; Ciprofloxacin; Darvon; Hydrocodone-acetaminophen; Morphine sulfate; Oxycodone-acetaminophen; Phenergan [promethazine hcl]; Propoxyphene hcl; Propoxyphene n-acetaminophen; Sulfonamide derivatives; and Tramadol hcl   Review of Systems Review of Systems + sore throat, resolved + cough + sneezing No pleuritic pain, but feels tight over anterior chest + wheezing + nasal congestion + post-nasal drainage + sinus pain/pressure No itchy/red eyes No earache No hemoptysis + SOB + fever, + chills + nausea + vomiting No abdominal pain + diarrhea No urinary symptoms No skin rash + fatigue + myalgias + headache    Physical Exam Triage Vital Signs ED Triage Vitals  Enc Vitals Group     BP 02/23/19 0948 119/72     Pulse Rate 02/23/19 0948 97     Resp 02/23/19 0948 20     Temp 02/23/19 0948 97.8 F (36.6 C)     Temp Source 02/23/19 0948 Oral     SpO2 02/23/19 0948 93 %     Weight 02/23/19 0949 131 lb (59.4 kg)     Height 02/23/19 0949 '5\' 5"'$  (1.651 m)     Head Circumference --      Peak Flow --      Pain Score 02/23/19 0948 0     Pain Loc --      Pain Edu? --      Excl. in [place]? --    No data found.  Updated  Vital Signs BP 119/72 (BP Location: Right Arm)   Pulse 97   Temp 97.8 F (36.6 C) (Oral)   Resp 20   Ht '5\' 5"'$  (1.651 m)   Wt 59.4 kg   SpO2 93%   BMI 21.80 kg/m   Visual Acuity Right Eye Distance:   Left Eye Distance:   Bilateral Distance:    Right Eye Near:   Left Eye Near:    Bilateral Near:     Physical Exam Nursing notes and Vital Signs reviewed. Appearance:  Patient appears stated age, and in no acute distress Eyes:  Pupils are equal, round, and reactive to light and accomodation.  Extraocular movement is intact.  Conjunctivae are not inflamed  Ears:  Canals normal.  Tympanic membranes normal.  Nose:  Mildly congested turbinates.  No sinus tenderness.   Pharynx:  Normal Neck:  Supple.  Enlarged posterior/lateral nodes are palpated bilaterally, tender to palpation on the left.   Lungs:  Clear to auscultation.  Breath sounds are equal.  Moving air well. Heart:  Regular rate and rhythm without murmurs, rubs, or gallops.  Abdomen:  Nontender without masses or hepatosplenomegaly.  Bowel sounds are present.  No CVA or flank tenderness.  Extremities:  No edema.  Skin:  No rash present.    UC Treatments / Results  Labs (all labs ordered are listed, but only abnormal results are displayed) Labs Reviewed - No data to display  EKG None  Radiology No results found.  Procedures Procedures (including critical care time)  Medications Ordered in UC Medications  ketorolac (TORADOL) 30 MG/ML injection 30 mg (has no administration in time range)  promethazine (PHENERGAN) injection 12.5 mg (has no administration in time range)    Initial Impression / Assessment and Plan / UC Course  I have reviewed the triage vital signs and the nursing notes.  Pertinent labs & imaging results that were available during my care of the patient were reviewed by me and considered  in my medical decision making (see chart for details).    Suspect viral URI; ?bronchitis.  Note decreased SpO2  93% Administered Toradol '30mg'$  at patient's request. Rx for Robitussin AC for night time cough (she notes that she can tolerated codeine) Controlled Substance Prescriptions I have consulted the [place] Controlled Substances Registry for this patient, and feel the risk/benefit ratio today is favorable for proceeding with this prescription for a controlled substance.    Administered Phenergan 12.'5mg'$  IM.  Given Rx for 12.'5mg'$ . Administered Toradol '30mg'$  at patient's request. Begin empiric Augmentin. Call Family Doctor if not improved in one week.    Final Clinical Impressions(s) / UC Diagnoses   Final diagnoses:  Nausea vomiting and diarrhea  Influenza-like illness   Discharge Instructions   None    ED Prescriptions    Medication Sig Dispense Auth. Provider   guaiFENesin-codeine 100-10 MG/5ML syrup Take 98m by mouth at bedtime as needed for cough.  May repeat dose in 4 to 6 hours. 100 mL BKandra Nicolas MD   amoxicillin-clavulanate (AUGMENTIN XR) 1000-62.5 MG 12 hr tablet Take 1 tablet by mouth 2 (two) times daily for 14 days. Take with food. 14 tablet BKandra Nicolas MD   promethazine (PHENERGAN) 12.5 MG tablet Take 1 tablet (12.5 mg total) by mouth every 6 (six) hours as needed for nausea or vomiting. 30 tablet BKandra Nicolas MD        BKandra Nicolas MD 02/24/19 [id number]

## 2019-03-08 ENCOUNTER — Telehealth: Payer: Self-pay | Admitting: Family Medicine

## 2019-03-08 ENCOUNTER — Emergency Department (INDEPENDENT_AMBULATORY_CARE_PROVIDER_SITE_OTHER)
Admission: EM | Admit: 2019-03-08 | Discharge: 2019-03-08 | Disposition: A | Discharge status: Home / Self Care | Payer: Medicare Other | Source: Home / Self Care | Attending: Family Medicine | Admitting: Family Medicine

## 2019-03-08 ENCOUNTER — Ambulatory Visit (INDEPENDENT_AMBULATORY_CARE_PROVIDER_SITE_OTHER): Payer: Medicare Other

## 2019-03-08 ENCOUNTER — Other Ambulatory Visit: Payer: Self-pay

## 2019-03-08 ENCOUNTER — Encounter: Payer: Self-pay | Admitting: Emergency Medicine

## 2019-03-08 DIAGNOSIS — R112 Nausea with vomiting, unspecified: Secondary | ICD-10-CM

## 2019-03-08 DIAGNOSIS — J302 Other seasonal allergic rhinitis: Secondary | ICD-10-CM | POA: Diagnosis not present

## 2019-03-08 DIAGNOSIS — R0989 Other specified symptoms and signs involving the circulatory and respiratory systems: Secondary | ICD-10-CM | POA: Diagnosis not present

## 2019-03-08 DIAGNOSIS — R51 Headache: Secondary | ICD-10-CM

## 2019-03-08 MED ORDER — KETOROLAC TROMETHAMINE 30 MG/ML IJ SOLN
30.0000 mg | Freq: Once | INTRAMUSCULAR | Status: AC
  Administered 2019-03-08: 30 mg via INTRAMUSCULAR

## 2019-03-08 MED ORDER — PROMETHAZINE HCL 25 MG/ML IJ SOLN
25.0000 mg | Freq: Once | INTRAMUSCULAR | Status: AC
  Administered 2019-03-08: 15:00:00 25 mg via INTRAMUSCULAR

## 2019-03-08 MED ORDER — MONTELUKAST SODIUM 10 MG PO TABS: ORAL_TABLET | ORAL | 2 refills | Status: DC

## 2019-03-08 MED ORDER — PROMETHAZINE HCL 12.5 MG PO TABS: 12.5000 mg | ORAL_TABLET | Freq: Four times a day (QID) | ORAL | 0 refills | Status: DC | PRN

## 2019-03-08 NOTE — Telephone Encounter (Signed)
Patient was recently treated for respiratory infection with Augmentin.  She calls today reporting that she has developed increased facial pain and nasal congestion, and is concerned that she has developed sinusitis. Chart review reveals a CT Head WO contrast done 10/13/03 for evaluation of headache that showed mild mucosal thickening in the right maxillary sinus.  Interpretation:  Mild chronic right maxillary sinusitis.  PLAN:  Will schedule sinus films.

## 2019-03-08 NOTE — ED Triage Notes (Signed)
Nausea, vomiting, sneezing, cough, SOB, congestion, headache, fatigue x 4 weeks

## 2019-03-08 NOTE — Discharge Instructions (Addendum)
During allergy season: Keep windows closed as much as possible. Plan outdoor activities when pollen counts are lowest. This is usually during the evening hours. When coming indoors, change clothing and shower before sitting on furniture or bedding.

## 2019-03-08 NOTE — ED Provider Notes (Signed)
[name]    CSN: [id number] Arrival date & time: 03/08/19  1321     History   Chief Complaint Chief Complaint  Patient presents with  . Nausea    HPI Brittney Gonzalez is a 58 y.o. female.   Patient complains of approximately 4 week history of increased sinus congestion with headache, facial pressure, cough, wheezing, and shortness of breath with activity.  She believes that she may have had a fever, but denies pleuritic pain.  She has a history of recurrent nausea/vomiting, now worse.  She believes that she has sinusitis, and requests a Z-pak, although she recently finished a course of Augmentin. Review of records reveals that she had visited the [facility] yesterday.  Her CBC there revealed a normal white blood count (WBC 7.5).  Other parameters were normal except for elevated eosinophils (1.0 X 10 E3/microliter). CMP was WNL except gluc 243, AST 44, and ALT 40.  COVID test was negative.  The history is provided by the patient.    Past Medical History:  Diagnosis Date  . Anxiety   . Bipolar disorder ([place])   . Diabetes mellitus type II   . GERD (gastroesophageal reflux disease)   . Hyperlipemia   . Hypertension   . TIA (transient ischemic attack)     Patient Active Problem List   Diagnosis Date Noted  . Diabetes type 2, uncontrolled ([place]) 09/01/2016  . Marijuana use 07/04/2016  . Cystocele 07/01/2016  . Vaginal irritation 07/01/2016  . Decreased libido 05/14/2016  . PTSD (post-traumatic stress disorder) 05/14/2016  . Fatty liver disease, nonalcoholic 67/34/1937  . Vitamin D deficiency 03/12/2016  . Elevated liver enzymes 03/12/2016  . Elevated TSH 03/12/2016  . Loose stools 03/12/2016  . Controlled type 2 diabetes mellitus without complication, without long-term current use of insulin ([place]) 03/11/2016  . Bipolar 1 disorder, mixed, moderate ([place]) 03/11/2016  . COPD exacerbation ([place]) 03/11/2016  . Insomnia  03/11/2016  . Hyperlipidemia associated with type 2 diabetes mellitus ([place]) 03/11/2016  . No energy 03/11/2016  . Right shoulder pain 03/11/2016  . Atypical chest pain 03/11/2016  . Bipolar 1 disorder, mixed ([place]) 10/03/2011    Class: Chronic  . ANKLE SPRAIN, RIGHT 09/23/2010  . COUGH 06/27/2010  . ALLERGIC RHINITIS 06/19/2010  . G E R D 06/19/2010  . LUMBAR STRAIN, ACUTE 05/25/2010  . MYALGIA 03/28/2010  . DIABETES MELLITUS, TYPE II 03/11/2010  . Hyperlipidemia 03/11/2010  . Musculoskeletal disease 03/11/2010    Past Surgical History:  Procedure Laterality Date  . ABDOMINAL HYSTERECTOMY    . APPENDECTOMY    . RIGHT OOPHORECTOMY      OB History   No obstetric history on file.      Home Medications    Prior to Admission medications   Medication Sig Start Date End Date Taking? Authorizing Provider  alprazolam Duanne Moron) 2 MG tablet Take 2 mg by mouth 4 (four) times daily.    [provider]  AMBULATORY NON FORMULARY MEDICATION One touch test strips and lancets. 06/26/16   Breeback, Royetta Car, PA-C  AMBULATORY NON FORMULARY MEDICATION One touch Delica 90W Lancets [id number]:J24.2 Use to test blood sugar three times daily Fax:[phone] 11/25/16   Breeback, Jade L, PA-C  Blood Glucose Monitoring Suppl (ONE TOUCH ULTRA 2) w/Device KIT Check fasting blood sugar every morning and 2 hours after one meal of the day. 01/14/17   Breeback, Jade L, PA-C  furosemide (LASIX) 40 MG tablet TAKE 1 TABLET BY MOUTH  DAILY AS NEEDED 07/18/16   Breeback, Jade L, PA-C  glipiZIDE (GLUCOTROL) 10 MG tablet Take 1 tablet (10 mg total) by mouth daily before breakfast. MUST make appointment for future refills. 03/24/17   Breeback, Jade L, PA-C  Lancets (ONETOUCH ULTRASOFT) lancets Check fasting blood sugar every morning and 2 hours after one meal of the day. 01/14/17   Breeback, Royetta Car, PA-C  metFORMIN (GLUCOPHAGE) 500 MG tablet Take 2 tablets (1,000 mg total) by mouth 2 (two) times daily with a meal. NEED  FOLLOW UP APPOINTMENT FOR MORE REFILLS 12/02/16   Breeback, Jade L, PA-C  metFORMIN (GLUCOPHAGE) 500 MG tablet Take 2 tablets (1,000 mg total) by mouth 2 (two) times daily with a meal. NEED FOLLOW UP APPOINTMENT FOR MORE REFILLS 02/04/17   Breeback, Jade L, PA-C  montelukast (SINGULAIR) 10 MG tablet Take one tab PO HS for allergy symptoms 03/08/19   Kandra Nicolas, MD  ONE TOUCH ULTRA TEST test strip USE TO TEST BLOOD SUGAR FOR A MAXIMUM OF 4 TIMES DAILY 01/08/17   Breeback, Jade L, PA-C  promethazine (PHENERGAN) 12.5 MG tablet Take 1 tablet (12.5 mg total) by mouth every 6 (six) hours as needed for nausea or vomiting. 03/08/19   Kandra Nicolas, MD  traZODone (DESYREL) 150 MG tablet Take 150 mg by mouth at bedtime.    [provider]  Vitamin D, Ergocalciferol, (DRISDOL) 50000 units CAPS capsule Take 1 capsule (50,000 Units total) by mouth every 7 (seven) days. 03/12/16   Donella Stade, PA-C    Family History Family History  Problem Relation Age of Onset  . Alcohol abuse Father   . Hypertension Father   . Diabetes Father   . Aneurysm Father   . COPD Father   . COPD Mother   . Colon cancer Mother     Social History Social History   Tobacco Use  . Smoking status: Never Smoker  . Smokeless tobacco: Never Used  Substance Use Topics  . Alcohol use: Yes    Comment: socially  . Drug use: No     Allergies   Celebrex [celecoxib]; Ciprofloxacin; Darvon; Hydrocodone-acetaminophen; Morphine sulfate; Oxycodone-acetaminophen; Phenergan [promethazine hcl]; Propoxyphene hcl; Propoxyphene n-acetaminophen; Sulfonamide derivatives; and Tramadol hcl   Review of Systems Review of Systems + sore throat + cough No pleuritic pain + wheezing + nasal congestion + post-nasal drainage + sinus pain/pressure + itchy/red eyes No earache No hemoptysis + SOB with activity ? fever/chills + nausea + vomiting No abdominal pain No diarrhea No urinary symptoms No skin rash + fatigue No  myalgias + headache    Physical Exam Triage Vital Signs ED Triage Vitals  Enc Vitals Group     BP 03/08/19 1405 104/66     Pulse Rate 03/08/19 1405 (!) 101     Resp --      Temp 03/08/19 1405 97.7 F (36.5 C)     Temp Source 03/08/19 1405 Oral     SpO2 03/08/19 1405 98 %     Weight 03/08/19 1407 132 lb (59.9 kg)     Height 03/08/19 1407 '5\' 6"'$  (1.676 m)     Head Circumference --      Peak Flow --      Pain Score 03/08/19 1406 6     Pain Loc --      Pain Edu? --      Excl. in [place]? --    No data found.  Updated Vital Signs BP 104/66 (BP Location: Right Arm)  Pulse (!) 101   Temp 97.7 F (36.5 C) (Oral)   Ht '5\' 6"'$  (1.676 m)   Wt 59.9 kg   SpO2 98%   BMI 21.31 kg/m   Visual Acuity Right Eye Distance:   Left Eye Distance:   Bilateral Distance:    Right Eye Near:   Left Eye Near:    Bilateral Near:     Physical Exam Nursing notes and Vital Signs reviewed. Appearance:  Patient appears stated age, and in no acute distress Eyes:  Pupils are equal, round, and reactive to light and accomodation.  Extraocular movement is intact.  Conjunctivae are not inflamed  Ears:  Canals normal.  Tympanic membranes normal.  Nose:  Mildly congested turbinates.  No sinus tenderness.   Pharynx:  Normal Neck:  Supple.  Mildly enlarged tender posterior/lateral nodes are palpated bilaterally. Lungs:  Clear to auscultation.  Breath sounds are equal.  Moving air well. Heart:  Regular rate and rhythm without murmurs, rubs, or gallops.  Abdomen:  Mild tenderness without masses or hepatosplenomegaly.  Bowel sounds are present.  No CVA or flank tenderness.  Extremities:  No edema.  Skin:  No rash present.    UC Treatments / Results  Labs (all labs ordered are listed, but only abnormal results are displayed) Labs Reviewed - No data to display  EKG None  Radiology Dg Sinuses Complete  Result Date: 03/08/2019 CLINICAL DATA:  Facial pains and increased sinus congestion for the past few  weeks. EXAM: PARANASAL SINUSES - COMPLETE 3 + VIEW COMPARISON:  MR brain dated November 27, 2003. FINDINGS: The paranasal sinus are aerated. There is no evidence of sinus opacification air-fluid levels or mucosal thickening. No significant bone abnormalities are seen. IMPRESSION: Negative. Electronically Signed   By: Titus Dubin M.D.   On: 03/08/2019 14:04    Procedures Procedures (including critical care time)  Medications Ordered in UC Medications  promethazine (PHENERGAN) injection 25 mg (25 mg Intramuscular Given 03/08/19 1509)  ketorolac (TORADOL) 30 MG/ML injection 30 mg (30 mg Intramuscular Given 03/08/19 1509)    Initial Impression / Assessment and Plan / UC Course  I have reviewed the triage vital signs and the nursing notes.  Pertinent labs & imaging results that were available during my care of the patient were reviewed by me and considered in my medical decision making (see chart for details).    No evidence sinusitis.  With a history of eosinophilia (demonstrated on CBC yesterday), suspect seasonal/perennial rhinitis; will begin trial of Singulair. Administered Toradol '30mg'$  IM, and Phenergan '25mg'$  at patient's request.  Rx given for Phenergan 12.'5mg'$  for nausea. Recommend follow-up with allergist.   Final Clinical Impressions(s) / UC Diagnoses   Final diagnoses:  Seasonal allergic rhinitis, unspecified trigger  Non-intractable vomiting with nausea, unspecified vomiting type     Discharge Instructions     ? During allergy season:  Keep windows closed as much as possible.  Plan outdoor activities when pollen counts are lowest. This is usually during the evening hours.  When coming indoors, change clothing and shower before sitting on furniture or bedding.    ED Prescriptions    Medication Sig Dispense Auth. Provider   montelukast (SINGULAIR) 10 MG tablet Take one tab PO HS for allergy symptoms 15 tablet Kandra Nicolas, MD   promethazine (PHENERGAN) 12.5 MG  tablet Take 1 tablet (12.5 mg total) by mouth every 6 (six) hours as needed for nausea or vomiting. 30 tablet Kandra Nicolas, MD  Kandra Nicolas, MD 03/08/19 [phone]

## 2019-05-26 ENCOUNTER — Emergency Department (INDEPENDENT_AMBULATORY_CARE_PROVIDER_SITE_OTHER)
Admission: EM | Admit: 2019-05-26 | Discharge: 2019-05-26 | Disposition: A | Discharge status: Home / Self Care | Payer: Medicare Other | Source: Home / Self Care | Attending: Family Medicine | Admitting: Family Medicine

## 2019-05-26 ENCOUNTER — Other Ambulatory Visit: Payer: Self-pay

## 2019-05-26 DIAGNOSIS — B9789 Other viral agents as the cause of diseases classified elsewhere: Secondary | ICD-10-CM

## 2019-05-26 DIAGNOSIS — R112 Nausea with vomiting, unspecified: Secondary | ICD-10-CM

## 2019-05-26 DIAGNOSIS — J069 Acute upper respiratory infection, unspecified: Secondary | ICD-10-CM | POA: Diagnosis not present

## 2019-05-26 DIAGNOSIS — R059 Cough, unspecified: Secondary | ICD-10-CM

## 2019-05-26 DIAGNOSIS — R05 Cough: Secondary | ICD-10-CM

## 2019-05-26 MED ORDER — GUAIFENESIN-CODEINE 100-10 MG/5ML PO SOLN: ORAL | 0 refills | Status: DC

## 2019-05-26 MED ORDER — KETOROLAC TROMETHAMINE 30 MG/ML IJ SOLN
30.0000 mg | Freq: Once | INTRAMUSCULAR | Status: AC
  Administered 2019-05-26: 30 mg via INTRAMUSCULAR

## 2019-05-26 MED ORDER — PROMETHAZINE HCL 12.5 MG PO TABS: 12.5000 mg | ORAL_TABLET | Freq: Four times a day (QID) | ORAL | 1 refills | Status: DC | PRN

## 2019-05-26 MED ORDER — AZITHROMYCIN 250 MG PO TABS: ORAL_TABLET | ORAL | 0 refills | Status: DC

## 2019-05-26 MED ORDER — MONTELUKAST SODIUM 10 MG PO TABS: ORAL_TABLET | ORAL | 0 refills | Status: AC

## 2019-05-26 NOTE — ED Triage Notes (Signed)
Nausea, vomiting, diarrhea, cough, sneezing, sore throat, fever, no appetite x 1 week

## 2019-05-26 NOTE — Discharge Instructions (Addendum)
Increase fluid intake. Take plain guaifenesin (1200mg  extended release tabs such as Mucinex) twice daily, with plenty of water, for cough and congestion.   Try warm salt water gargles for sore throat.  Stop all antihistamines for now, and other non-prescription cough/cold preparations.   If symptoms become significantly worse during the night or over the weekend, proceed to the local emergency room.

## 2019-05-26 NOTE — ED Provider Notes (Signed)
[name]    CSN: [id number] Arrival date & time: 05/26/19  4503     History   Chief Complaint Chief Complaint  Patient presents with  . Nausea    HPI Brittney Gonzalez is a 58 y.o. female.   Patient began to feel fatigued one week ago, followed by development of nausea/vomiting, myalgias, loose stools, sore throat, sneezing, cough and tightness in her chest.  She has had fever to 101 during the past four days.  The history is provided by the patient.    Past Medical History:  Diagnosis Date  . Anxiety   . Bipolar disorder ([place])   . Diabetes mellitus type II   . GERD (gastroesophageal reflux disease)   . Hyperlipemia   . Hypertension   . TIA (transient ischemic attack)     Patient Active Problem List   Diagnosis Date Noted  . Diabetes type 2, uncontrolled ([place]) 09/01/2016  . Marijuana use 07/04/2016  . Cystocele 07/01/2016  . Vaginal irritation 07/01/2016  . Decreased libido 05/14/2016  . PTSD (post-traumatic stress disorder) 05/14/2016  . Fatty liver disease, nonalcoholic 88/82/8003  . Vitamin D deficiency 03/12/2016  . Elevated liver enzymes 03/12/2016  . Elevated TSH 03/12/2016  . Loose stools 03/12/2016  . Controlled type 2 diabetes mellitus without complication, without long-term current use of insulin ([place]) 03/11/2016  . Bipolar 1 disorder, mixed, moderate ([place]) 03/11/2016  . COPD exacerbation ([place]) 03/11/2016  . Insomnia 03/11/2016  . Hyperlipidemia associated with type 2 diabetes mellitus ([place]) 03/11/2016  . No energy 03/11/2016  . Right shoulder pain 03/11/2016  . Atypical chest pain 03/11/2016  . Bipolar 1 disorder, mixed ([place]) 10/03/2011    Class: Chronic  . ANKLE SPRAIN, RIGHT 09/23/2010  . COUGH 06/27/2010  . ALLERGIC RHINITIS 06/19/2010  . G E R D 06/19/2010  . LUMBAR STRAIN, ACUTE 05/25/2010  . MYALGIA 03/28/2010  . DIABETES MELLITUS, TYPE II 03/11/2010  . Hyperlipidemia 03/11/2010  . Musculoskeletal disease 03/11/2010     Past Surgical History:  Procedure Laterality Date  . ABDOMINAL HYSTERECTOMY    . APPENDECTOMY    . RIGHT OOPHORECTOMY         Home Medications    Prior to Admission medications   Medication Sig Start Date End Date Taking? Authorizing Provider  alprazolam Duanne Moron) 2 MG tablet Take 2 mg by mouth 4 (four) times daily.    [provider]  AMBULATORY NON FORMULARY MEDICATION One touch test strips and lancets. 06/26/16   Breeback, Royetta Car, PA-C  AMBULATORY NON FORMULARY MEDICATION One touch Delica 49Z Lancets [id number] Use to test blood sugar three times daily Fax:[phone] 11/25/16   Breeback, Jade L, PA-C  azithromycin (ZITHROMAX Z-PAK) 250 MG tablet Take 2 tabs today; then begin one tab once daily for 4 more days. 05/26/19   Kandra Nicolas, MD  Blood Glucose Monitoring Suppl (ONE TOUCH ULTRA 2) w/Device KIT Check fasting blood sugar every morning and 2 hours after one meal of the day. 01/14/17   Breeback, Jade L, PA-C  furosemide (LASIX) 40 MG tablet TAKE 1 TABLET BY MOUTH DAILY AS NEEDED 07/18/16   Breeback, Jade L, PA-C  glipiZIDE (GLUCOTROL) 10 MG tablet Take 1 tablet (10 mg total) by mouth daily before breakfast. MUST make appointment for future refills. 03/24/17   Breeback, Jade L, PA-C  guaiFENesin-codeine 100-10 MG/5ML syrup Take 52m by mouth at bedtime as needed for cough. 05/26/19   BKandra Nicolas MD  Lancets ([facility]) lancets Check  fasting blood sugar every morning and 2 hours after one meal of the day. 01/14/17   Breeback, Royetta Car, PA-C  metFORMIN (GLUCOPHAGE) 500 MG tablet Take 2 tablets (1,000 mg total) by mouth 2 (two) times daily with a meal. NEED FOLLOW UP APPOINTMENT FOR MORE REFILLS 12/02/16   Breeback, Jade L, PA-C  metFORMIN (GLUCOPHAGE) 500 MG tablet Take 2 tablets (1,000 mg total) by mouth 2 (two) times daily with a meal. NEED FOLLOW UP APPOINTMENT FOR MORE REFILLS 02/04/17   Breeback, Jade L, PA-C  montelukast (SINGULAIR) 10 MG tablet Take one tab  PO HS for allergy symptoms 05/26/19   Kandra Nicolas, MD  ONE TOUCH ULTRA TEST test strip USE TO TEST BLOOD SUGAR FOR A MAXIMUM OF 4 TIMES DAILY 01/08/17   Breeback, Jade L, PA-C  promethazine (PHENERGAN) 12.5 MG tablet Take 1 tablet (12.5 mg total) by mouth every 6 (six) hours as needed for nausea or vomiting. 05/26/19   Kandra Nicolas, MD  traZODone (DESYREL) 150 MG tablet Take 150 mg by mouth at bedtime.    [provider]  Vitamin D, Ergocalciferol, (DRISDOL) 50000 units CAPS capsule Take 1 capsule (50,000 Units total) by mouth every 7 (seven) days. 03/12/16   Donella Stade, PA-C    Family History Family History  Problem Relation Age of Onset  . Alcohol abuse Father   . Hypertension Father   . Diabetes Father   . Aneurysm Father   . COPD Father   . COPD Mother   . Colon cancer Mother     Social History Social History   Tobacco Use  . Smoking status: Never Smoker  . Smokeless tobacco: Never Used  Substance Use Topics  . Alcohol use: Yes    Comment: socially  . Drug use: No     Allergies   Celebrex [celecoxib], Ciprofloxacin, Darvon, Hydrocodone-acetaminophen, Morphine sulfate, Oxycodone-acetaminophen, Phenergan [promethazine hcl], Propoxyphene hcl, Propoxyphene n-acetaminophen, Sulfonamide derivatives, and Tramadol hcl   Review of Systems Review of Systems + sore throat + cough No pleuritic pain No wheezing + nasal congestion + post-nasal drainage + sinus pain/pressure No itchy/red eyes No earache No hemoptysis + SOB + fever, + chills + nausea + vomiting No abdominal pain + diarrhea No urinary symptoms No skin rash + fatigue + myalgias + headache    Physical Exam Triage Vital Signs ED Triage Vitals  Enc Vitals Group     BP      Pulse      Resp      Temp      Temp src      SpO2      Weight      Height      Head Circumference      Peak Flow      Pain Score      Pain Loc      Pain Edu?      Excl. in [place]?    No data found.   Updated Vital Signs BP (!) 81/53 (BP Location: Right Arm)   Pulse 83   Temp 99 F (37.2 C) (Oral)   Ht '5\' 8"'$  (1.727 m)   Wt 55.3 kg   SpO2 98%   BMI 18.55 kg/m   Visual Acuity Right Eye Distance:   Left Eye Distance:   Bilateral Distance:    Right Eye Near:   Left Eye Near:    Bilateral Near:     Physical Exam Nursing notes and Vital Signs reviewed. Appearance:  Patient appears stated age, and in no acute distress Eyes:  Pupils are equal, round, and reactive to light and accomodation.  Extraocular movement is intact.  Conjunctivae are not inflamed  Ears:  Canals normal.  Tympanic membranes normal.  Nose:  Mildly congested turbinates.   Maxillary sinus tenderness is present.  Pharynx:  Normal Neck:  Supple.  Enlarged posterior/lateral nodes are palpated bilaterally, tender to palpation on the left.   Lungs:  Clear to auscultation.  Breath sounds are equal.  Moving air well. Heart:  Regular rate and rhythm without murmurs, rubs, or gallops.  Abdomen:  Nontender without masses or hepatosplenomegaly.  Bowel sounds are present.  No CVA or flank tenderness.  Extremities:  No edema.  Skin:  No rash present.    UC Treatments / Results  Labs (all labs ordered are listed, but only abnormal results are displayed) Labs Reviewed - No data to display  EKG   Radiology No results found.  Procedures Procedures (including critical care time)  Medications Ordered in UC Medications  ketorolac (TORADOL) 30 MG/ML injection 30 mg (30 mg Intramuscular Given 05/26/19 0951)    Initial Impression / Assessment and Plan / UC Course  I have reviewed the triage vital signs and the nursing notes.  Pertinent labs & imaging results that were available during my care of the patient were reviewed by me and considered in my medical decision making (see chart for details).    Suspect viral URI; however will order COVID19 testing. Begin empiric Z-pak Administered Toradol '30mg'$  IM Rx for  Phenergan 12.'5mg'$  PO for nausea. Refill Singulair Rx for Robitussin AC bedtime (75m, no refill)   Final Clinical Impressions(s) / UC Diagnoses   Final diagnoses:  Viral URI with cough  Non-intractable vomiting with nausea, unspecified vomiting type     Discharge Instructions     Increase fluid intake. Take plain guaifenesin ('1200mg'$  extended release tabs such as Mucinex) twice daily, with plenty of water, for cough and congestion.   Try warm salt water gargles for sore throat.  Stop all antihistamines for now, and other non-prescription cough/cold preparations.   If symptoms become significantly worse during the night or over the weekend, proceed to the local emergency room.     ED Prescriptions    Medication Sig Dispense Auth. Provider   promethazine (PHENERGAN) 12.5 MG tablet Take 1 tablet (12.5 mg total) by mouth every 6 (six) hours as needed for nausea or vomiting. 30 tablet BKandra Nicolas MD   azithromycin (ZITHROMAX Z-PAK) 250 MG tablet Take 2 tabs today; then begin one tab once daily for 4 more days. 6 tablet BKandra Nicolas MD   montelukast (SINGULAIR) 10 MG tablet Take one tab PO HS for allergy symptoms 30 tablet BKandra Nicolas MD   guaiFENesin-codeine 100-10 MG/5ML syrup Take 179mby mouth at bedtime as needed for cough. 50 mL BeKandra NicolasMD         BeKandra NicolasMD 05/27/19 [id number]

## 2019-05-30 ENCOUNTER — Telehealth: Payer: Self-pay

## 2019-05-30 NOTE — Telephone Encounter (Signed)
Pt called for COVID results.  Not back yet.

## 2019-06-16 LAB — SARS-COV-2 RNA,(COVID-19) QUALITATIVE NAAT: SARS CoV2 RNA: NOT DETECTED

## 2019-06-16 NOTE — Telephone Encounter (Signed)
Left voice message inquiring about patients status. COVID results negative. Encouraged patient to call with questions or concerns.

## 2019-09-25 ENCOUNTER — Other Ambulatory Visit: Payer: Self-pay

## 2019-09-25 ENCOUNTER — Emergency Department (INDEPENDENT_AMBULATORY_CARE_PROVIDER_SITE_OTHER)
Admission: EM | Admit: 2019-09-25 | Discharge: 2019-09-25 | Disposition: A | Discharge status: Home / Self Care | Payer: Medicare Other | Source: Home / Self Care | Attending: Family Medicine | Admitting: Family Medicine

## 2019-09-25 DIAGNOSIS — R197 Diarrhea, unspecified: Secondary | ICD-10-CM

## 2019-09-25 DIAGNOSIS — R112 Nausea with vomiting, unspecified: Secondary | ICD-10-CM

## 2019-09-25 DIAGNOSIS — R519 Headache, unspecified: Secondary | ICD-10-CM

## 2019-09-25 DIAGNOSIS — J069 Acute upper respiratory infection, unspecified: Secondary | ICD-10-CM | POA: Diagnosis not present

## 2019-09-25 MED ORDER — PROMETHAZINE HCL 25 MG/ML IJ SOLN
25.0000 mg | Freq: Once | INTRAMUSCULAR | Status: AC
  Administered 2019-09-25: 14:00:00 25 mg via INTRAMUSCULAR

## 2019-09-25 MED ORDER — KETOROLAC TROMETHAMINE 30 MG/ML IJ SOLN
30.0000 mg | Freq: Once | INTRAMUSCULAR | Status: AC
  Administered 2019-09-25: 30 mg via INTRAMUSCULAR

## 2019-09-25 MED ORDER — AMOXICILLIN-POT CLAVULANATE ER 1000-62.5 MG PO TB12: ORAL_TABLET | ORAL | 0 refills | Status: AC

## 2019-09-25 MED ORDER — GUAIFENESIN-CODEINE 100-10 MG/5ML PO SOLN: ORAL | 0 refills | Status: DC

## 2019-09-25 MED ORDER — PROMETHAZINE HCL 25 MG PO TABS: 25.0000 mg | ORAL_TABLET | Freq: Four times a day (QID) | ORAL | 0 refills | Status: DC | PRN

## 2019-09-25 MED ORDER — ZYRTEC ALLERGY 10 MG PO CAPS: ORAL_CAPSULE | ORAL | 0 refills | Status: AC

## 2019-09-25 NOTE — ED Provider Notes (Signed)
[name]    CSN: [id number] Arrival date & time: 09/25/19  1134      History   Chief Complaint Chief Complaint  Patient presents with  . Headache  . Cough    HPI Brittney Gonzalez is a 58 y.o. female.   Patient complains of onset of nausea/vomiting five days ago, which often occurs at the onset of an illness.  She has developed cough, scratchy throat, sinus congestion, fatigue, and facial pressure.  She denies changes in taste/smell.  The history is provided by the patient.    Past Medical History:  Diagnosis Date  . Anxiety   . Bipolar disorder ([place])   . Diabetes mellitus type II   . GERD (gastroesophageal reflux disease)   . Hyperlipemia   . Hypertension   . TIA (transient ischemic attack)     Patient Active Problem List   Diagnosis Date Noted  . Diabetes type 2, uncontrolled ([place]) 09/01/2016  . Marijuana use 07/04/2016  . Cystocele 07/01/2016  . Vaginal irritation 07/01/2016  . Decreased libido 05/14/2016  . PTSD (post-traumatic stress disorder) 05/14/2016  . Fatty liver disease, nonalcoholic 65/78/4696  . Vitamin D deficiency 03/12/2016  . Elevated liver enzymes 03/12/2016  . Elevated TSH 03/12/2016  . Loose stools 03/12/2016  . Controlled type 2 diabetes mellitus without complication, without long-term current use of insulin ([place]) 03/11/2016  . Bipolar 1 disorder, mixed, moderate ([place]) 03/11/2016  . COPD exacerbation ([place]) 03/11/2016  . Insomnia 03/11/2016  . Hyperlipidemia associated with type 2 diabetes mellitus ([place]) 03/11/2016  . No energy 03/11/2016  . Right shoulder pain 03/11/2016  . Atypical chest pain 03/11/2016  . Bipolar 1 disorder, mixed ([place]) 10/03/2011    Class: Chronic  . ANKLE SPRAIN, RIGHT 09/23/2010  . COUGH 06/27/2010  . ALLERGIC RHINITIS 06/19/2010  . G E R D 06/19/2010  . LUMBAR STRAIN, ACUTE 05/25/2010  . MYALGIA 03/28/2010  . DIABETES MELLITUS, TYPE II 03/11/2010  . Hyperlipidemia 03/11/2010  .  Musculoskeletal disease 03/11/2010    Past Surgical History:  Procedure Laterality Date  . ABDOMINAL HYSTERECTOMY    . APPENDECTOMY    . RIGHT OOPHORECTOMY      OB History   No obstetric history on file.      Home Medications    Prior to Admission medications   Medication Sig Start Date End Date Taking? Authorizing Provider  alprazolam Duanne Moron) 2 MG tablet Take 2 mg by mouth 4 (four) times daily.    [provider]  AMBULATORY NON FORMULARY MEDICATION One touch test strips and lancets. 06/26/16   Breeback, Royetta Car, PA-C  AMBULATORY NON FORMULARY MEDICATION One touch Delica 29B Lancets [id number] Use to test blood sugar three times daily Fax:[phone] 11/25/16   Breeback, Jade L, PA-C  amoxicillin-clavulanate (AUGMENTIN XR) 1000-62.5 MG 12 hr tablet Take one tab BID for 7 days 09/25/19   Kandra Nicolas, MD  azithromycin (ZITHROMAX Z-PAK) 250 MG tablet Take 2 tabs today; then begin one tab once daily for 4 more days. 05/26/19   Kandra Nicolas, MD  Blood Glucose Monitoring Suppl (ONE TOUCH ULTRA 2) w/Device KIT Check fasting blood sugar every morning and 2 hours after one meal of the day. 01/14/17   Donella Stade, PA-C  Cetirizine HCl (ZYRTEC ALLERGY) 10 MG CAPS Take one tab PO daily for allergy symptoms 09/25/19   Kandra Nicolas, MD  furosemide (LASIX) 40 MG tablet TAKE 1 TABLET BY MOUTH DAILY AS NEEDED 07/18/16   Alden Hipp,  Jade L, PA-C  glipiZIDE (GLUCOTROL) 10 MG tablet Take 1 tablet (10 mg total) by mouth daily before breakfast. MUST make appointment for future refills. 03/24/17   Breeback, Jade L, PA-C  guaiFENesin-codeine 100-10 MG/5ML syrup Take 32m by mouth at bedtime as needed for cough. 09/25/19   BKandra Nicolas MD  Lancets ([facility]) lancets Check fasting blood sugar every morning and 2 hours after one meal of the day. 01/14/17   Breeback, JRoyetta Car PA-C  metFORMIN (GLUCOPHAGE) 500 MG tablet Take 2 tablets (1,000 mg total) by mouth 2 (two) times daily  with a meal. NEED FOLLOW UP APPOINTMENT FOR MORE REFILLS 12/02/16   Breeback, Jade L, PA-C  metFORMIN (GLUCOPHAGE) 500 MG tablet Take 2 tablets (1,000 mg total) by mouth 2 (two) times daily with a meal. NEED FOLLOW UP APPOINTMENT FOR MORE REFILLS 02/04/17   Breeback, Jade L, PA-C  montelukast (SINGULAIR) 10 MG tablet Take one tab PO HS for allergy symptoms 05/26/19   BKandra Nicolas MD  ONE TOUCH ULTRA TEST test strip USE TO TEST BLOOD SUGAR FOR A MAXIMUM OF 4 TIMES DAILY 01/08/17   Breeback, Jade L, PA-C  promethazine (PHENERGAN) 25 MG tablet Take 1 tablet (25 mg total) by mouth every 6 (six) hours as needed for nausea or vomiting. 09/25/19   BKandra Nicolas MD  traZODone (DESYREL) 150 MG tablet Take 150 mg by mouth at bedtime.    [provider]  Vitamin D, Ergocalciferol, (DRISDOL) 50000 units CAPS capsule Take 1 capsule (50,000 Units total) by mouth every 7 (seven) days. 03/12/16   BDonella Stade PA-C    Family History Family History  Problem Relation Age of Onset  . Alcohol abuse Father   . Hypertension Father   . Diabetes Father   . Aneurysm Father   . COPD Father   . COPD Mother   . Colon cancer Mother     Social History Social History   Tobacco Use  . Smoking status: Never Smoker  . Smokeless tobacco: Never Used  Substance Use Topics  . Alcohol use: Yes    Comment: socially  . Drug use: No     Allergies   Celebrex [celecoxib], Ciprofloxacin, Darvon, Hydrocodone-acetaminophen, Morphine sulfate, Oxycodone-acetaminophen, Phenergan [promethazine hcl], Propoxyphene hcl, Propoxyphene n-acetaminophen, Sulfonamide derivatives, and Tramadol hcl   Review of Systems Review of Systems + sore throat + cough No pleuritic pain ? wheezing + nasal congestion + post-nasal drainage + sinus pain/pressure No itchy/red eyes No earache No hemoptysis No SOB No fever, + chills + nausea + vomiting No abdominal pain No diarrhea No urinary symptoms No skin rash +  fatigue No myalgias + headache Used OTC meds (Zyrtec) without relief  Physical Exam Triage Vital Signs ED Triage Vitals  Enc Vitals Group     BP 09/25/19 1259 101/71     Pulse Rate 09/25/19 1259 93     Resp 09/25/19 1259 14     Temp 09/25/19 1259 98.4 F (36.9 C)     Temp Source 09/25/19 1259 Oral     SpO2 09/25/19 1259 97 %     Weight 09/25/19 1300 124 lb (56.2 kg)     Height 09/25/19 1300 '5\' 6"'$  (1.676 m)     Head Circumference --      Peak Flow --      Pain Score 09/25/19 1300 0     Pain Loc --      Pain Edu? --      Excl.  in [place]? --    No data found.  Updated Vital Signs BP 101/71 (BP Location: Right Arm)   Pulse 93   Temp 98.4 F (36.9 C) (Oral)   Resp 14   Ht '5\' 6"'$  (1.676 m)   Wt 56.2 kg   SpO2 97%   BMI 20.01 kg/m   Visual Acuity Right Eye Distance:   Left Eye Distance:   Bilateral Distance:    Right Eye Near:   Left Eye Near:    Bilateral Near:     Physical Exam Nursing notes and Vital Signs reviewed. Appearance:  Patient appears stated age, and in no acute distress Eyes:  Pupils are equal, round, and reactive to light and accomodation.  Extraocular movement is intact.  Conjunctivae are not inflamed  Ears:  Canals normal.  Tympanic membranes normal.  Nose:  Congested turbinates.  Maxillary sinus tenderness is present.  Pharynx:  Normal Neck:  Supple.  Enlarged posterior/lateral nodes are palpated bilaterally, tender to palpation on the left.   Lungs:  Clear to auscultation.  Breath sounds are equal.  Moving air well. Heart:  Regular rate and rhythm without murmurs, rubs, or gallops.  Abdomen:  Nontender without masses or hepatosplenomegaly.  Bowel sounds are present.  No CVA or flank tenderness.  Extremities:  No edema.  Skin:  No rash present.    UC Treatments / Results  Labs (all labs ordered are listed, but only abnormal results are displayed) Labs Reviewed - No data to display  EKG   Radiology No results found.  Procedures Procedures  (including critical care time)  Medications Ordered in UC Medications  promethazine (PHENERGAN) injection 25 mg (25 mg Intramuscular Given 09/25/19 1405)  ketorolac (TORADOL) 30 MG/ML injection 30 mg (30 mg Intramuscular Given 09/25/19 1405)    Initial Impression / Assessment and Plan / UC Course  I have reviewed the triage vital signs and the nursing notes.  Pertinent labs & imaging results that were available during my care of the patient were reviewed by me and considered in my medical decision making (see chart for details).    ?sinusitis.  Begin Augmentin.  Rx for Zyrtec at patient's request.  Administered Phenergan '25mg'$  IM.  Given Rx for PO Phenergan. Administered Toradol '30mg'$  IM Rx for Robitussin AC for night time cough.  Controlled Substance Prescriptions I have consulted the [place] Controlled Substances Registry for this patient, and feel the risk/benefit ratio today is favorable for proceeding with this prescription for a controlled substance.  Followup with Family Doctor if not improved in 10 days. Final Clinical Impressions(s) / UC Diagnoses   Final diagnoses:  Viral URI with cough  Nausea vomiting and diarrhea  Nonintractable episodic headache, unspecified headache type     Discharge Instructions     Begin clear liquids for about 24 hours, then may begin a BRAT diet (Bananas, Rice, Applesauce, Toast) when nausea and vomiting resolved.  Then gradually advance to a regular diet as tolerated.  Avoid milk products until well.   Take plain guaifenesin ('1200mg'$  extended release tabs such as Mucinex) twice daily, with plenty of water, for cough and congestion.  Get adequate rest.   May use Afrin nasal spray (or generic oxymetazoline) each morning for about 5 days and then discontinue.  Also recommend using saline nasal spray several times daily and saline nasal irrigation (AYR is a common brand).  Use Flonase nasal spray each morning after using Afrin nasal spray and saline nasal  irrigation. Try warm salt water gargles  for sore throat.  Stop all antihistamines for now, and other non-prescription cough/cold preparations.  If symptoms become significantly worse during the night or over the weekend, proceed to the local emergency room.       ED Prescriptions    Medication Sig Dispense Auth. Provider   guaiFENesin-codeine 100-10 MG/5ML syrup Take 6m by mouth at bedtime as needed for cough. 50 mL BKandra Nicolas MD   promethazine (PHENERGAN) 25 MG tablet Take 1 tablet (25 mg total) by mouth every 6 (six) hours as needed for nausea or vomiting. 30 tablet BKandra Nicolas MD   amoxicillin-clavulanate (AUGMENTIN XR) 1000-62.5 MG 12 hr tablet Take one tab BID for 7 days 14 tablet BKandra Nicolas MD   Cetirizine HCl (ZYRTEC ALLERGY) 10 MG CAPS Take one tab PO daily for allergy symptoms 30 capsule BKandra Nicolas MD        BKandra Nicolas MD 09/29/19 1244

## 2019-09-25 NOTE — ED Triage Notes (Signed)
Pt c/o headache and cough since Wednesday. Hx of allergies.

## 2019-09-25 NOTE — Discharge Instructions (Addendum)
Begin clear liquids for about 24 hours, then may begin a BRAT diet (Bananas, Rice, Applesauce, Toast) when nausea and vomiting resolved.  Then gradually advance to a regular diet as tolerated.  Avoid milk products until well.   Take plain guaifenesin (1200mg  extended release tabs such as Mucinex) twice daily, with plenty of water, for cough and congestion.  Get adequate rest.   May use Afrin nasal spray (or generic oxymetazoline) each morning for about 5 days and then discontinue.  Also recommend using saline nasal spray several times daily and saline nasal irrigation (AYR is a common brand).  Use Flonase nasal spray each morning after using Afrin nasal spray and saline nasal irrigation. Try warm salt water gargles for sore throat.  Stop all antihistamines for now, and other non-prescription cough/cold preparations.  If symptoms become significantly worse during the night or over the weekend, proceed to the local emergency room.

## 2019-12-13 ENCOUNTER — Other Ambulatory Visit: Payer: Self-pay

## 2019-12-13 ENCOUNTER — Emergency Department (INDEPENDENT_AMBULATORY_CARE_PROVIDER_SITE_OTHER)
Admission: EM | Admit: 2019-12-13 | Discharge: 2019-12-13 | Disposition: A | Discharge status: Home / Self Care | Payer: Medicare Other | Source: Home / Self Care | Attending: Family Medicine | Admitting: Family Medicine

## 2019-12-13 DIAGNOSIS — R5383 Other fatigue: Secondary | ICD-10-CM

## 2019-12-13 DIAGNOSIS — R7989 Other specified abnormal findings of blood chemistry: Secondary | ICD-10-CM

## 2019-12-13 DIAGNOSIS — R195 Other fecal abnormalities: Secondary | ICD-10-CM

## 2019-12-13 DIAGNOSIS — R110 Nausea: Secondary | ICD-10-CM

## 2019-12-13 DIAGNOSIS — E559 Vitamin D deficiency, unspecified: Secondary | ICD-10-CM | POA: Diagnosis not present

## 2019-12-13 MED ORDER — PROMETHAZINE HCL 25 MG/ML IJ SOLN
25.0000 mg | Freq: Once | INTRAMUSCULAR | Status: AC
  Administered 2019-12-13: 25 mg via INTRAMUSCULAR

## 2019-12-13 MED ORDER — KETOROLAC TROMETHAMINE 30 MG/ML IJ SOLN
30.0000 mg | Freq: Once | INTRAMUSCULAR | Status: AC
  Administered 2019-12-13: 17:00:00 30 mg via INTRAMUSCULAR

## 2019-12-13 NOTE — ED Provider Notes (Signed)
[name]    CSN: [id number] Arrival date & time: 12/13/19  1447      History   Chief Complaint Chief Complaint  Patient presents with  . Nausea  . Emesis  . Diarrhea    HPI Brittney Gonzalez is a 59 y.o. female.   Patient complains of increase in her frequent nausea/vomiting and diarrhea during the past three days, but little change in her usual symptoms otherwise.  She denies fevers, chills, and sweats, respiratory and GU symptoms.  She has had three negative COVID19 tests. She is being followed by digestive health for IBS/diarrhea, GERD, nausea/vomiting, abdominal pain, and dysphagia.  She was recently treated for C.diff infection with vancomycin.  She is scheduled for EGD, and colonoscopy has been recommended.      Past Medical History:  Diagnosis Date  . Anxiety   . Bipolar disorder ([place])   . Diabetes mellitus type II   . GERD (gastroesophageal reflux disease)   . Hyperlipemia   . Hypertension   . TIA (transient ischemic attack)     Patient Active Problem List   Diagnosis Date Noted  . Diabetes type 2, uncontrolled ([place]) 09/01/2016  . Marijuana use 07/04/2016  . Cystocele 07/01/2016  . Vaginal irritation 07/01/2016  . Decreased libido 05/14/2016  . PTSD (post-traumatic stress disorder) 05/14/2016  . Fatty liver disease, nonalcoholic 76/72/0947  . Vitamin D deficiency 03/12/2016  . Elevated liver enzymes 03/12/2016  . Elevated TSH 03/12/2016  . Loose stools 03/12/2016  . Controlled type 2 diabetes mellitus without complication, without long-term current use of insulin ([place]) 03/11/2016  . Bipolar 1 disorder, mixed, moderate ([place]) 03/11/2016  . COPD exacerbation ([place]) 03/11/2016  . Insomnia 03/11/2016  . Hyperlipidemia associated with type 2 diabetes mellitus ([place]) 03/11/2016  . No energy 03/11/2016  . Right shoulder pain 03/11/2016  . Atypical chest pain 03/11/2016  . Bipolar 1 disorder, mixed ([place]) 10/03/2011    Class: Chronic  . ANKLE  SPRAIN, RIGHT 09/23/2010  . COUGH 06/27/2010  . ALLERGIC RHINITIS 06/19/2010  . G E R D 06/19/2010  . LUMBAR STRAIN, ACUTE 05/25/2010  . MYALGIA 03/28/2010  . DIABETES MELLITUS, TYPE II 03/11/2010  . Hyperlipidemia 03/11/2010  . Musculoskeletal disease 03/11/2010    Past Surgical History:  Procedure Laterality Date  . ABDOMINAL HYSTERECTOMY    . APPENDECTOMY    . RIGHT OOPHORECTOMY      OB History   No obstetric history on file.      Home Medications    Prior to Admission medications   Medication Sig Start Date End Date Taking? Authorizing Provider  alprazolam Duanne Moron) 2 MG tablet Take 2 mg by mouth 4 (four) times daily.    [provider]  AMBULATORY NON FORMULARY MEDICATION One touch test strips and lancets. 06/26/16   Breeback, Royetta Car, PA-C  AMBULATORY NON FORMULARY MEDICATION One touch Delica 09G Lancets [id number] Use to test blood sugar three times daily [phone] 11/25/16   Breeback, Jade L, PA-C  amoxicillin-clavulanate (AUGMENTIN XR) 1000-62.5 MG 12 hr tablet Take one tab BID for 7 days 09/25/19   Kandra Nicolas, MD  azithromycin (ZITHROMAX Z-PAK) 250 MG tablet Take 2 tabs today; then begin one tab once daily for 4 more days. 05/26/19   Kandra Nicolas, MD  Blood Glucose Monitoring Suppl (ONE TOUCH ULTRA 2) w/Device KIT Check fasting blood sugar every morning and 2 hours after one meal of the day. 01/14/17   Iran Planas L, PA-C  Cetirizine HCl (  ZYRTEC ALLERGY) 10 MG CAPS Take one tab PO daily for allergy symptoms 09/25/19   Kandra Nicolas, MD  furosemide (LASIX) 40 MG tablet TAKE 1 TABLET BY MOUTH DAILY AS NEEDED 07/18/16   Breeback, Jade L, PA-C  glipiZIDE (GLUCOTROL) 10 MG tablet Take 1 tablet (10 mg total) by mouth daily before breakfast. MUST make appointment for future refills. 03/24/17   Breeback, Jade L, PA-C  guaiFENesin-codeine 100-10 MG/5ML syrup Take 28m by mouth at bedtime as needed for cough. 09/25/19   BKandra Nicolas MD  Lancets  ([facility] lancets Check fasting blood sugar every morning and 2 hours after one meal of the day. 01/14/17   Breeback, JRoyetta Car PA-C  metFORMIN (GLUCOPHAGE) 500 MG tablet Take 2 tablets (1,000 mg total) by mouth 2 (two) times daily with a meal. NEED FOLLOW UP APPOINTMENT FOR MORE REFILLS 12/02/16   Breeback, Jade L, PA-C  metFORMIN (GLUCOPHAGE) 500 MG tablet Take 2 tablets (1,000 mg total) by mouth 2 (two) times daily with a meal. NEED FOLLOW UP APPOINTMENT FOR MORE REFILLS 02/04/17   Breeback, Jade L, PA-C  montelukast (SINGULAIR) 10 MG tablet Take one tab PO HS for allergy symptoms 05/26/19   BKandra Nicolas MD  ONE TOUCH ULTRA TEST test strip USE TO TEST BLOOD SUGAR FOR A MAXIMUM OF 4 TIMES DAILY 01/08/17   Breeback, Jade L, PA-C  promethazine (PHENERGAN) 25 MG tablet Take 1 tablet (25 mg total) by mouth every 6 (six) hours as needed for nausea or vomiting. 09/25/19   BKandra Nicolas MD  traZODone (DESYREL) 150 MG tablet Take 150 mg by mouth at bedtime.    [provider]  Vitamin D, Ergocalciferol, (DRISDOL) 50000 units CAPS capsule Take 1 capsule (50,000 Units total) by mouth every 7 (seven) days. 03/12/16   BDonella Stade PA-C    Family History Family History  Problem Relation Age of Onset  . Alcohol abuse Father   . Hypertension Father   . Diabetes Father   . Aneurysm Father   . COPD Father   . COPD Mother   . Colon cancer Mother     Social History Social History   Tobacco Use  . Smoking status: Never Smoker  . Smokeless tobacco: Never Used  Substance Use Topics  . Alcohol use: Yes    Comment: socially  . Drug use: No     Allergies   Celebrex [celecoxib], Ciprofloxacin, Darvon, Hydrocodone-acetaminophen, Morphine sulfate, Oxycodone-acetaminophen, Phenergan [promethazine hcl], Propoxyphene hcl, Propoxyphene n-acetaminophen, Sulfonamide derivatives, and Tramadol hcl   Review of Systems Review of Systems  Constitutional: Positive for activity change,  appetite change and fatigue. Negative for chills, diaphoresis, fever and unexpected weight change.  HENT: Negative.   Eyes: Negative.   Respiratory: Negative.   Cardiovascular: Negative.   Gastrointestinal: Positive for abdominal pain, diarrhea, nausea and vomiting. Negative for abdominal distention, anal bleeding, blood in stool and constipation.  Genitourinary: Negative.   Musculoskeletal: Negative.   Skin: Negative.      Physical Exam Triage Vital Signs ED Triage Vitals  Enc Vitals Group     BP 12/13/19 1530 111/72     Pulse Rate 12/13/19 1530 98     Resp 12/13/19 1530 20     Temp 12/13/19 1533 97.6 F (36.4 C)     Temp src --      SpO2 12/13/19 1530 96 %     Weight 12/13/19 1531 129 lb (58.5 kg)     Height 12/13/19 1531 5'  8" (1.727 m)     Head Circumference --      Peak Flow --      Pain Score 12/13/19 1531 0     Pain Loc --      Pain Edu? --      Excl. in [place]? --    No data found.  Updated Vital Signs BP 111/72 (BP Location: Right Arm)   Pulse 98   Temp 97.6 F (36.4 C)   Resp 20   Ht '5\' 8"'$  (1.727 m)   Wt 58.5 kg   SpO2 96%   BMI 19.61 kg/m   Visual Acuity Right Eye Distance:   Left Eye Distance:   Bilateral Distance:    Right Eye Near:   Left Eye Near:    Bilateral Near:     Physical Exam Nursing notes and Vital Signs reviewed. Appearance:  Patient appears older than stated age, pale, but in no acute distress.    Eyes:  Pupils are equal, round, and reactive to light and accomodation.  Extraocular movement is intact.  Conjunctivae are not inflamed   Pharynx:  Moist mucous membranes  Neck:  Supple.  No adenopathy Lungs:  Clear to auscultation.  Breath sounds are equal.  Moving air well. Heart:  Regular rate and rhythm without murmurs, rubs, or gallops.  Abdomen:  Mildly tender without masses or hepatosplenomegaly.  Bowel sounds are present.  No CVA or flank tenderness.  Extremities:  No edema.  Skin:  No rash present.     UC Treatments / Results    Labs (all labs ordered are listed, but only abnormal results are displayed) Labs Reviewed - No data to display  EKG   Radiology No results found.  Procedures Procedures (including critical care time)  Medications Ordered in UC Medications  promethazine (PHENERGAN) injection 25 mg (has no administration in time range)  ketorolac (TORADOL) 30 MG/ML injection 30 mg (has no administration in time range)    Initial Impression / Assessment and Plan / UC Course  I have reviewed the triage vital signs and the nursing notes.  Pertinent labs & imaging results that were available during my care of the patient were reviewed by me and considered in my medical decision making (see chart for details).    Patient has multiple GI problems that do not appear worse than usual today. Administered Toradol '30mg'$  IM, and promethazine '25mg'$  IM. Review of records reveals low Vitamin D 25-OH level (14ng/ml) three years ago.  Suspect persistent deficiency because of chronic diarrhea and malabsorption.  Vitamin D 25-OH level pending. Followup with Family Doctor and gastroenterologist for further planned evaluation and management.  Final Clinical Impressions(s) / UC Diagnoses   Final diagnoses:  Other fatigue  Nausea  Low vitamin D level     Discharge Instructions     Continue present treatments as prescribed by your gastroenterologist and family doctor.   ED Prescriptions    None        Kandra Nicolas, MD 12/15/19 0800

## 2019-12-13 NOTE — ED Triage Notes (Signed)
Started feeling bad Sunday night with diarrhea and vomiting.  Has had 3 test all negative.

## 2019-12-13 NOTE — Discharge Instructions (Addendum)
Continue present treatments as prescribed by your gastroenterologist and family doctor.

## 2019-12-14 LAB — VITAMIN D 25 HYDROXY (VIT D DEFICIENCY, FRACTURES): Vit D, 25-Hydroxy: 23 ng/mL — ABNORMAL LOW (ref 30–100)

## 2019-12-15 ENCOUNTER — Telehealth: Payer: Self-pay

## 2019-12-15 NOTE — Telephone Encounter (Signed)
Pt called requesting Vit D lab results. Informed that labs have improved from three years ago but per Dr Assunta Found, should follow up with PCP or GI for further eval and management.

## 2020-02-11 ENCOUNTER — Emergency Department (INDEPENDENT_AMBULATORY_CARE_PROVIDER_SITE_OTHER)
Admission: EM | Admit: 2020-02-11 | Discharge: 2020-02-11 | Disposition: A | Discharge status: Home / Self Care | Payer: Medicare Other | Source: Home / Self Care | Attending: Family Medicine | Admitting: Family Medicine

## 2020-02-11 ENCOUNTER — Encounter: Payer: Self-pay | Admitting: Emergency Medicine

## 2020-02-11 ENCOUNTER — Other Ambulatory Visit: Payer: Self-pay

## 2020-02-11 DIAGNOSIS — R197 Diarrhea, unspecified: Secondary | ICD-10-CM

## 2020-02-11 DIAGNOSIS — R300 Dysuria: Secondary | ICD-10-CM

## 2020-02-11 DIAGNOSIS — R110 Nausea: Secondary | ICD-10-CM | POA: Diagnosis not present

## 2020-02-11 DIAGNOSIS — R112 Nausea with vomiting, unspecified: Secondary | ICD-10-CM

## 2020-02-11 DIAGNOSIS — J069 Acute upper respiratory infection, unspecified: Secondary | ICD-10-CM

## 2020-02-11 LAB — POCT CBC W AUTO DIFF (K'VILLE URGENT CARE)

## 2020-02-11 LAB — POCT URINALYSIS DIP (MANUAL ENTRY)
Bilirubin, UA: NEGATIVE
Blood, UA: NEGATIVE
Glucose, UA: 500 mg/dL — AB
Nitrite, UA: POSITIVE — AB
Spec Grav, UA: 1.025 (ref 1.010–1.025)
Urobilinogen, UA: 1 E.U./dL
pH, UA: 5 (ref 5.0–8.0)

## 2020-02-11 MED ORDER — PROMETHAZINE HCL 25 MG/ML IJ SOLN
25.0000 mg | Freq: Once | INTRAMUSCULAR | Status: AC
  Administered 2020-02-11: 25 mg via INTRAMUSCULAR

## 2020-02-11 MED ORDER — GUAIFENESIN-CODEINE 100-10 MG/5ML PO SOLN: ORAL | 0 refills | Status: AC

## 2020-02-11 MED ORDER — PROMETHAZINE HCL 12.5 MG PO TABS: ORAL_TABLET | ORAL | 0 refills | Status: AC

## 2020-02-11 MED ORDER — KETOROLAC TROMETHAMINE 30 MG/ML IJ SOLN
30.0000 mg | Freq: Once | INTRAMUSCULAR | Status: AC
  Administered 2020-02-11: 13:00:00 30 mg via INTRAMUSCULAR

## 2020-02-11 NOTE — ED Triage Notes (Signed)
Patient here for nausea, vomiting and diarrhea; out of promethazine and desires refill; last dose was 3 days ago. Was diagnosed with c.diff on 11/25/19 and given 14 days course of vancomycin. She is scheduled to see her GI specialist in 2 days; planning on GB evaluation; has had appendectomy. No known exposure to covid positive person.

## 2020-02-11 NOTE — Discharge Instructions (Addendum)
To decrease diarrhea, mix one teaspoon Citrucel (methylcellulose) in 2 oz water and drink one to three times daily.  Do not drink extra fluids with this dose and do not drink fluids for one hour afterwards.     Take plain guaifenesin (600 or 1200mg  extended release tabs such as Mucinex) twice daily, with plenty of water, for cough and congestion.     Follow-up with family doctor if not improving about10 days.

## 2020-02-11 NOTE — ED Provider Notes (Signed)
[name]    CSN: [id number] Arrival date & time: 02/11/20  1128      History   Chief Complaint Chief Complaint  Patient presents with  . Emesis  . Diarrhea    HPI Brittney Gonzalez is a 59 y.o. female.   Patient complains of several day history of nasal congestion, cough, fever, headache, and nausea/vomiting/diarrhea.   She reports mild dysuria and requests that a urinalysis be checked.  She requests promethazine for her nausea, and injection of Toradol for her headache. She had C.diff colitis in January, and reports that she will see her gastroenterologist in two days.   The history is provided by the patient.    Past Medical History:  Diagnosis Date  . Anxiety   . Bipolar disorder ([place])   . Diabetes mellitus type II   . GERD (gastroesophageal reflux disease)   . Hyperlipemia   . Hypertension   . TIA (transient ischemic attack)     Patient Active Problem List   Diagnosis Date Noted  . Diabetes type 2, uncontrolled ([place]) 09/01/2016  . Marijuana use 07/04/2016  . Cystocele 07/01/2016  . Vaginal irritation 07/01/2016  . Decreased libido 05/14/2016  . PTSD (post-traumatic stress disorder) 05/14/2016  . Fatty liver disease, nonalcoholic 02/58/5277  . Vitamin D deficiency 03/12/2016  . Elevated liver enzymes 03/12/2016  . Elevated TSH 03/12/2016  . Loose stools 03/12/2016  . Controlled type 2 diabetes mellitus without complication, without long-term current use of insulin ([place]) 03/11/2016  . Bipolar 1 disorder, mixed, moderate ([place]) 03/11/2016  . COPD exacerbation ([place]) 03/11/2016  . Insomnia 03/11/2016  . Hyperlipidemia associated with type 2 diabetes mellitus ([place]) 03/11/2016  . No energy 03/11/2016  . Right shoulder pain 03/11/2016  . Atypical chest pain 03/11/2016  . Bipolar 1 disorder, mixed ([place]) 10/03/2011    Class: Chronic  . ANKLE SPRAIN, RIGHT 09/23/2010  . COUGH 06/27/2010  . ALLERGIC RHINITIS 06/19/2010  . G E R D 06/19/2010  .  LUMBAR STRAIN, ACUTE 05/25/2010  . MYALGIA 03/28/2010  . DIABETES MELLITUS, TYPE II 03/11/2010  . Hyperlipidemia 03/11/2010  . Musculoskeletal disease 03/11/2010    Past Surgical History:  Procedure Laterality Date  . ABDOMINAL HYSTERECTOMY    . APPENDECTOMY    . RIGHT OOPHORECTOMY      OB History   No obstetric history on file.      Home Medications    Prior to Admission medications   Medication Sig Start Date End Date Taking? Authorizing Provider  alprazolam Duanne Moron) 2 MG tablet Take 1 mg by mouth 4 (four) times daily.     [provider]  AMBULATORY NON FORMULARY MEDICATION One touch test strips and lancets. 06/26/16   Breeback, Royetta Car, PA-C  AMBULATORY NON FORMULARY MEDICATION One touch Delica 82U Lancets [id number]:X54.0 Use to test blood sugar three times daily Fax:[phone] 11/25/16   Iran Planas L, PA-C  amoxicillin-clavulanate (AUGMENTIN XR) 1000-62.5 MG 12 hr tablet Take one tab BID for 7 days 09/25/19   Kandra Nicolas, MD  Blood Glucose Monitoring Suppl (ONE TOUCH ULTRA 2) w/Device KIT Check fasting blood sugar every morning and 2 hours after one meal of the day. 01/14/17   Donella Stade, PA-C  Cetirizine HCl (ZYRTEC ALLERGY) 10 MG CAPS Take one tab PO daily for allergy symptoms 09/25/19   Kandra Nicolas, MD  furosemide (LASIX) 40 MG tablet TAKE 1 TABLET BY MOUTH DAILY AS NEEDED 07/18/16   Breeback, Jade L, PA-C  glipiZIDE (  GLUCOTROL) 10 MG tablet Take 1 tablet (10 mg total) by mouth daily before breakfast. MUST make appointment for future refills. 03/24/17   Breeback, Jade L, PA-C  guaiFENesin-codeine 100-10 MG/5ML syrup Take 53m by mouth at bedtime as needed for cough. 02/11/20   BKandra Nicolas MD  Lancets ([facility]) lancets Check fasting blood sugar every morning and 2 hours after one meal of the day. 01/14/17   Breeback, JRoyetta Car PA-C  metFORMIN (GLUCOPHAGE) 500 MG tablet Take 2 tablets (1,000 mg total) by mouth 2 (two) times daily with a meal.  NEED FOLLOW UP APPOINTMENT FOR MORE REFILLS 12/02/16   Breeback, Jade L, PA-C  metFORMIN (GLUCOPHAGE) 500 MG tablet Take 2 tablets (1,000 mg total) by mouth 2 (two) times daily with a meal. NEED FOLLOW UP APPOINTMENT FOR MORE REFILLS 02/04/17   Breeback, Jade L, PA-C  montelukast (SINGULAIR) 10 MG tablet Take one tab PO HS for allergy symptoms 05/26/19   BKandra Nicolas MD  ONE TOUCH ULTRA TEST test strip USE TO TEST BLOOD SUGAR FOR A MAXIMUM OF 4 TIMES DAILY 01/08/17   BDonella Stade PA-C  promethazine (PHENERGAN) 12.5 MG tablet May take one tab PO BID prn nausea 02/11/20   BKandra Nicolas MD  traZODone (DESYREL) 150 MG tablet Take 150 mg by mouth at bedtime.    [provider]  Vitamin D, Ergocalciferol, (DRISDOL) 50000 units CAPS capsule Take 1 capsule (50,000 Units total) by mouth every 7 (seven) days. 03/12/16   BDonella Stade PA-C    Family History Family History  Problem Relation Age of Onset  . Alcohol abuse Father   . Hypertension Father   . Diabetes Father   . Aneurysm Father   . COPD Father   . COPD Mother   . Colon cancer Mother     Social History Social History   Tobacco Use  . Smoking status: Never Smoker  . Smokeless tobacco: Never Used  Substance Use Topics  . Alcohol use: Yes    Comment: socially  . Drug use: No     Allergies   Celebrex [celecoxib], Ciprofloxacin, Darvon, Hydrocodone-acetaminophen, Morphine sulfate, Oxycodone-acetaminophen, Phenergan [promethazine hcl], Propoxyphene hcl, Propoxyphene n-acetaminophen, Sulfonamide derivatives, and Tramadol hcl   Review of Systems Review of Systems + sore throat + cough No pleuritic pain No wheezing + nasal congestion + post-nasal drainage + sinus pain/pressure No itchy/red eyes No earache No hemoptysis No SOB + fever, + chills + nausea + vomiting + abdominal pain + diarrhea ? urinary symptoms No skin rash + fatigue No myalgias + headache   Physical Exam Triage Vital Signs ED  Triage Vitals  Enc Vitals Group     BP 02/11/20 1207 134/83     Pulse Rate 02/11/20 1207 89     Resp 02/11/20 1207 16     Temp 02/11/20 1207 97.6 F (36.4 C)     Temp Source 02/11/20 1207 Oral     SpO2 02/11/20 1207 98 %     Weight 02/11/20 1208 121 lb (54.9 kg)     Height 02/11/20 1208 '5\' 8"'$  (1.727 m)     Head Circumference --      Peak Flow --      Pain Score 02/11/20 1208 0     Pain Loc --      Pain Edu? --      [place]    No data found.  Updated Vital Signs BP 134/83 (BP Location: Right Arm)  Pulse 89   Temp 97.6 F (36.4 C) (Oral)   Resp 16   Ht '5\' 8"'$  (1.727 m)   Wt 54.9 kg   SpO2 98%   BMI 18.40 kg/m   Visual Acuity Right Eye Distance:   Left Eye Distance:   Bilateral Distance:    Right Eye Near:   Left Eye Near:    Bilateral Near:     Physical Exam Nursing notes and Vital Signs reviewed. Appearance:  Patient is in no acute distress.  She is alert and oriented.  Eyes:  Pupils are equal, round, and reactive to light and accomodation.  Extraocular movement is intact.  Conjunctivae are not inflamed  Ears:  Canals normal.  Tympanic membranes normal.  Nose:  Mildly congested turbinates.  No sinus tenderness.  Pharynx:  Normal Neck:  Supple.  Shotty lateral nodes are present, tender to palpation on the left.   Lungs:  Clear to auscultation.  Breath sounds are equal.  Moving air well. Heart:  Regular rate and rhythm without murmurs, rubs, or gallops.  Abdomen:  Nontender without masses or hepatosplenomegaly.  Bowel sounds are present.  No CVA or flank tenderness.  Extremities:  No edema.  Skin:  No rash present.   UC Treatments / Results  Labs (all labs ordered are listed, but only abnormal results are displayed) Labs Reviewed  POCT URINALYSIS DIP (MANUAL ENTRY) - Abnormal; Notable for the following components:      Result Value   Color, UA orange (*)    Glucose, UA =500 (*)    Ketones, POC UA trace (5) (*)    Protein Ur, POC trace (*)     Nitrite, UA Positive (*)    Leukocytes, UA Trace (*)    All other components within normal limits  URINE CULTURE  POCT CBC W AUTO DIFF ([facility]):  WBC 5.0; LY 36.0; MO 5.5; GR 58.5; Hgb 14.0; Platelets 180   Note that urine appears unnaturally orange; patient denies use of AZO.  Will send urine culture  EKG   Radiology No results found.  Procedures Procedures (including critical care time)  Medications Ordered in UC Medications  promethazine (PHENERGAN) injection 25 mg (25 mg Intramuscular Given 02/11/20 1214)  ketorolac (TORADOL) 30 MG/ML injection 30 mg (30 mg Intramuscular Given 02/11/20 1253)    Initial Impression / Assessment and Plan / UC Course  I have reviewed the triage vital signs and the nursing notes.  Pertinent labs & imaging results that were available during my care of the patient were reviewed by me and considered in my medical decision making (see chart for details).    There is no evidence of bacterial infection today.  Treat symptomatically for now.  Urine culture pending. Rx for small quantity Phenergan 12.'5mg'$  (#10, no refill) Rx for Robitussin AC for night time cough (#50cc, no refill). Controlled Substance Prescriptions I have consulted the [place] Controlled Substances Registry for this patient, and feel the risk/benefit ratio today is favorable for proceeding with this prescription for a controlled substance.   Followup with GI as scheduled.   Final Clinical Impressions(s) / UC Diagnoses   Final diagnoses:  Dysuria  Viral URI with cough  Nausea vomiting and diarrhea     Discharge Instructions     To decrease diarrhea, mix one teaspoon Citrucel (methylcellulose) in 2 oz water and drink one to three times daily.  Do not drink extra fluids with this dose and do not drink fluids for one hour afterwards.  Take plain guaifenesin (600 or '1200mg'$  extended release tabs such as Mucinex) twice daily, with plenty of water, for cough and congestion.      Follow-up with family doctor if not improving about10 days.        ED Prescriptions    Medication Sig Dispense Auth. Provider   guaiFENesin-codeine 100-10 MG/5ML syrup Take 37m by mouth at bedtime as needed for cough. 50 mL BKandra Nicolas MD   promethazine (PHENERGAN) 12.5 MG tablet May take one tab PO BID prn nausea 10 tablet BKandra Nicolas MD        BKandra Nicolas MD 02/11/20 [phone]

## 2020-02-12 LAB — URINE CULTURE
MICRO NUMBER:: 10301005
Result:: NO GROWTH
SPECIMEN QUALITY:: ADEQUATE

## 2020-02-15 ENCOUNTER — Telehealth: Payer: Self-pay | Admitting: Emergency Medicine

## 2020-08-29 IMAGING — DX PARANASAL SINUSES - COMPLETE 3 + VIEW
3 series · 3 of 3 positions shown · non-contrast
Comparison: MR brain dated November 27, 2003.

CLINICAL DATA: Facial pains and increased sinus congestion for the
past few weeks.

EXAM:
PARANASAL SINUSES - COMPLETE 3 + VIEW

[pns waters]
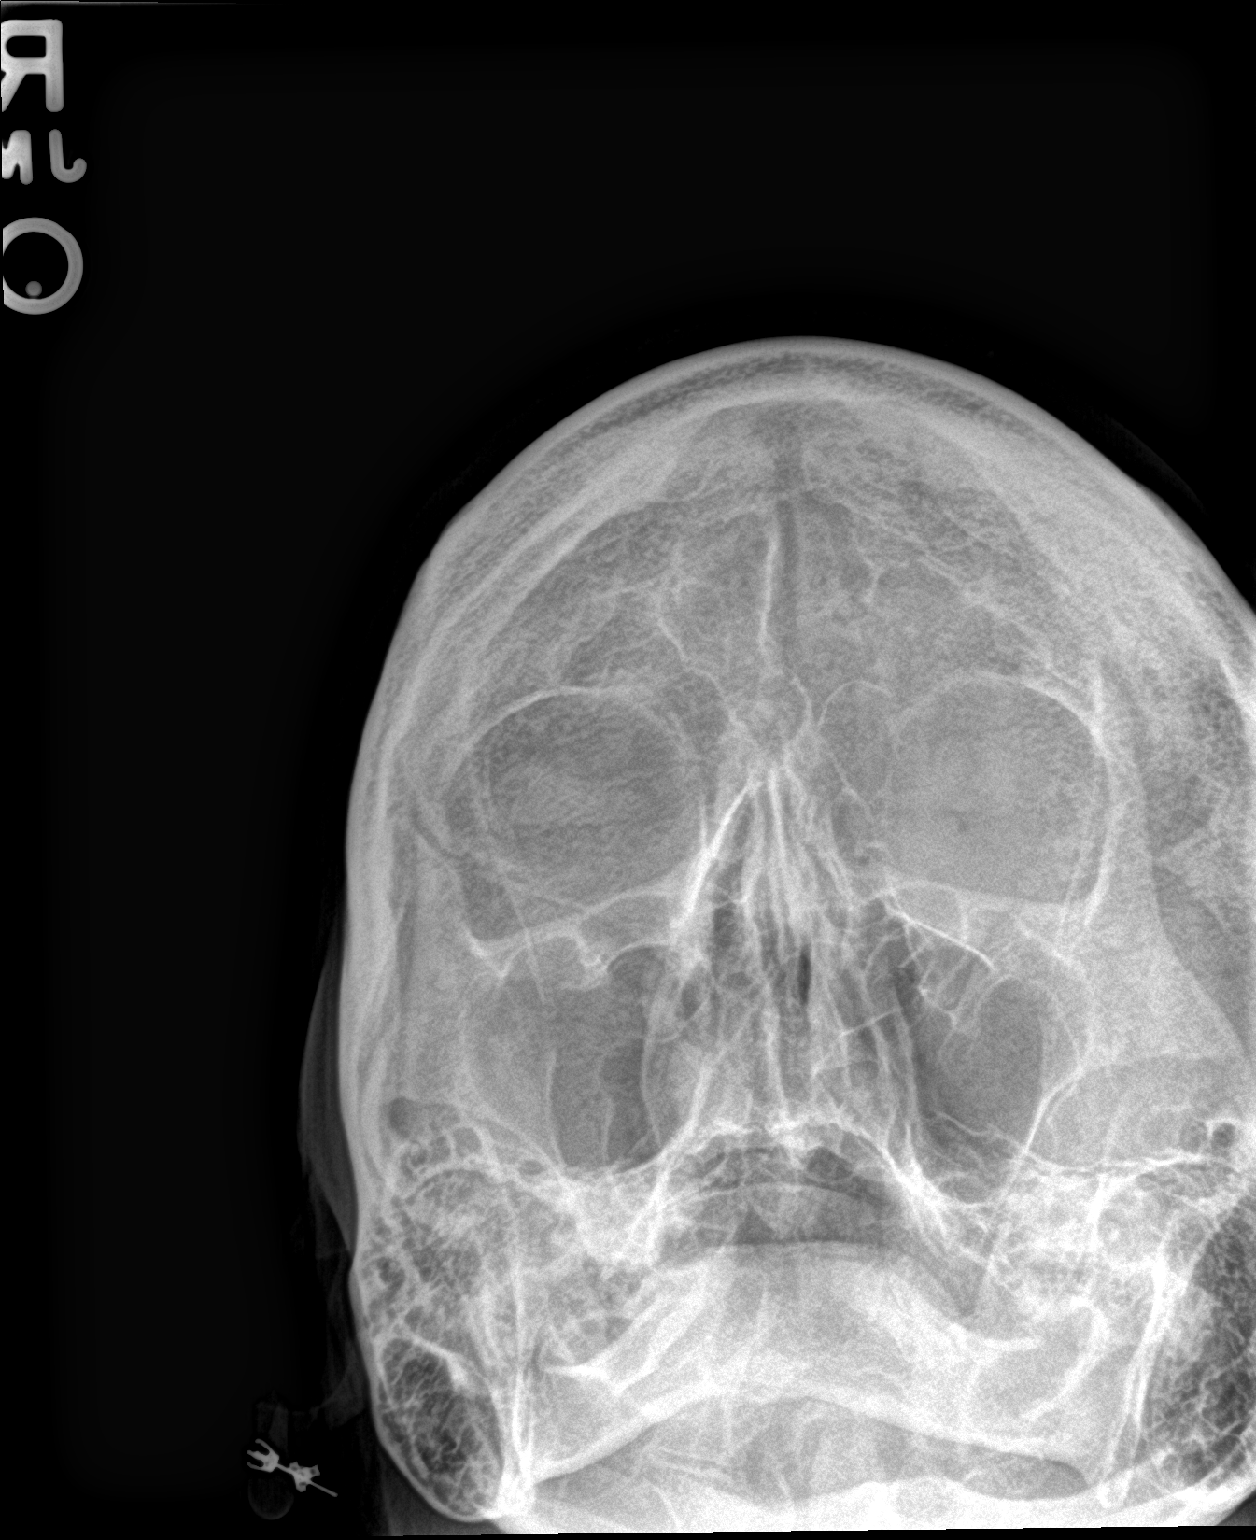

[[person_name]]
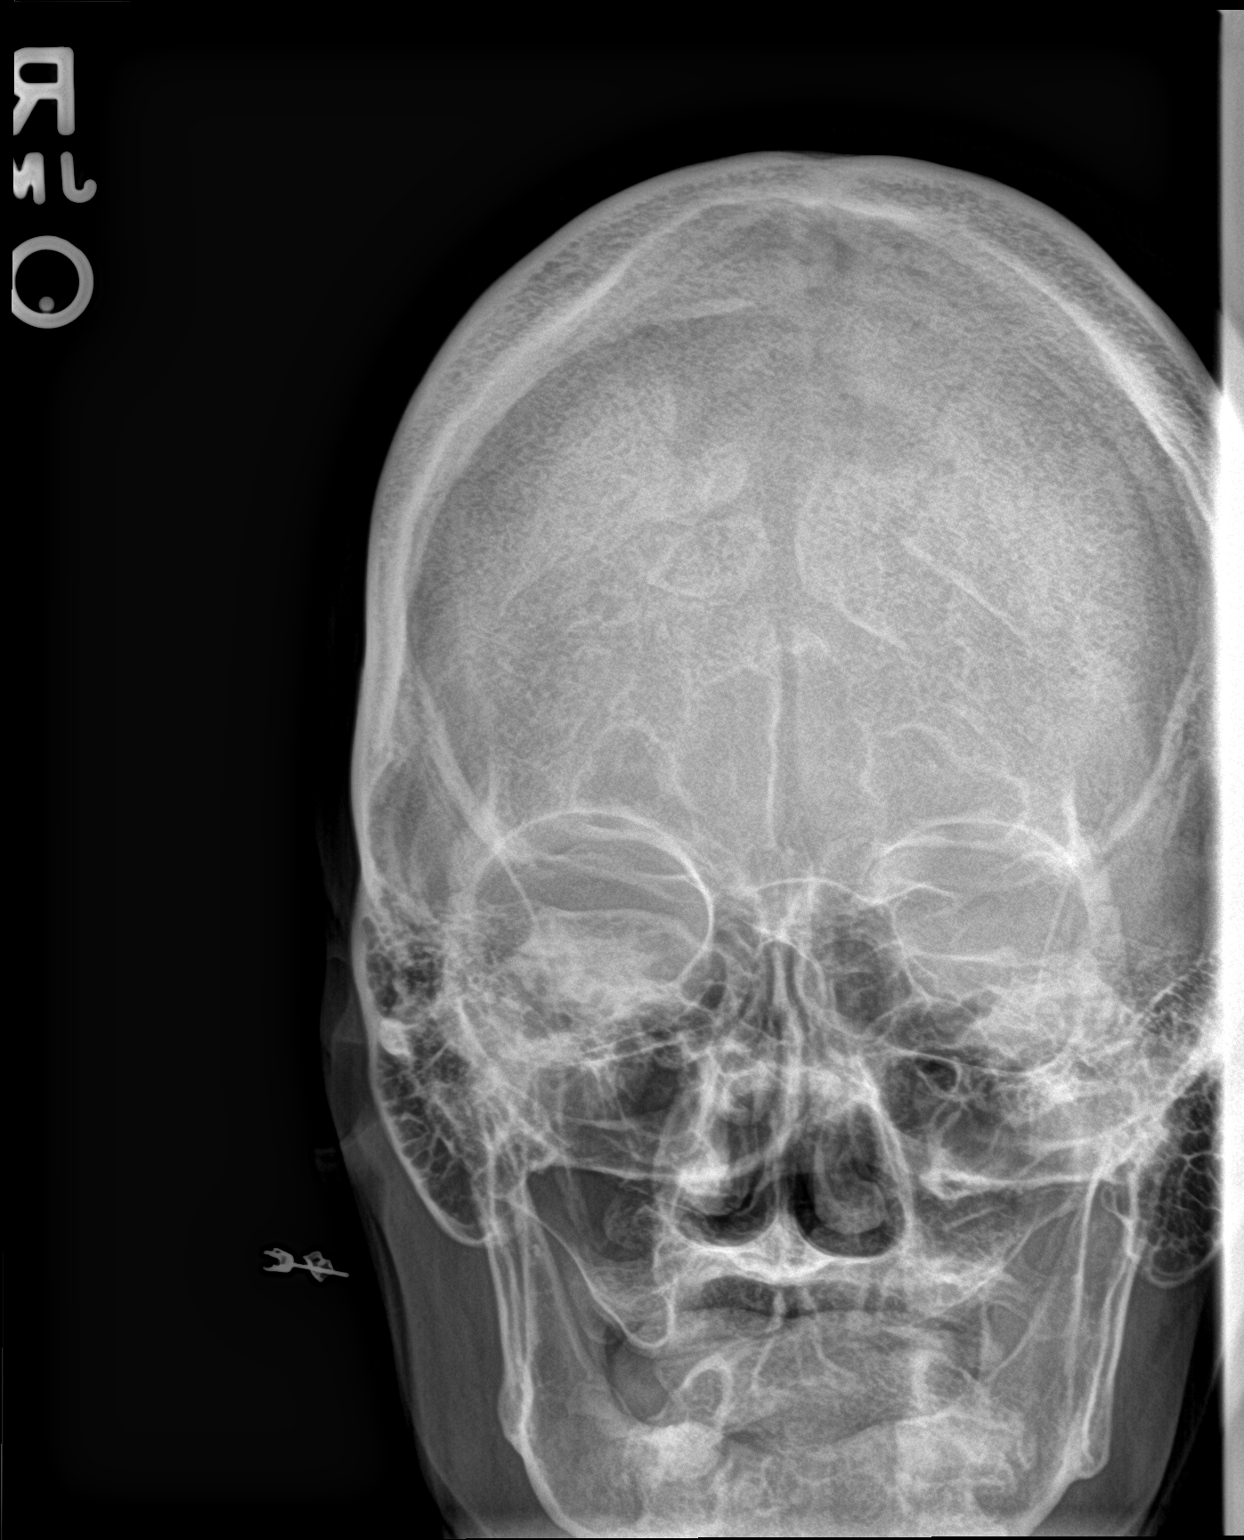

[pns lat]
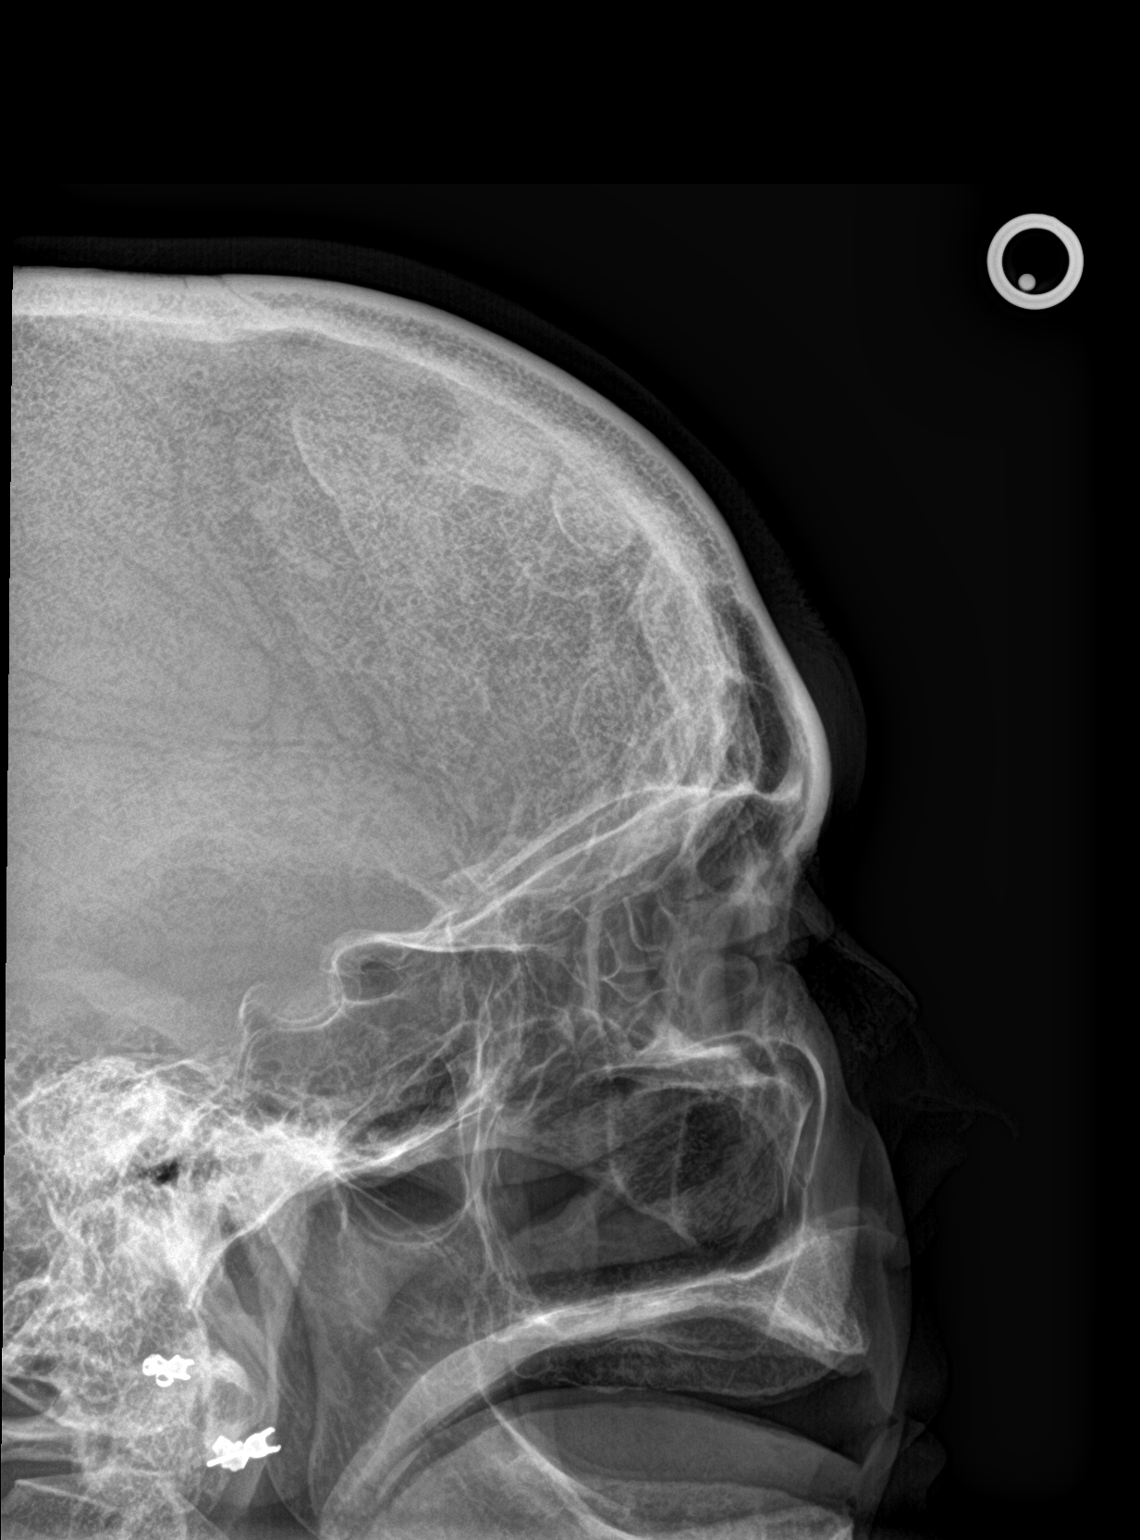

[3 of 3 positions shown; findings below may reference images not displayed]

FINDINGS: The paranasal sinus are aerated. There is no evidence of sinus
opacification air-fluid levels or mucosal thickening. No significant
bone abnormalities are seen.
IMPRESSION: Negative.

## 2021-04-19 ENCOUNTER — Emergency Department (INDEPENDENT_AMBULATORY_CARE_PROVIDER_SITE_OTHER)
Admission: EM | Admit: 2021-04-19 | Discharge: 2021-04-19 | Disposition: A | Discharge status: Home / Self Care | Payer: Medicare Other | Source: Home / Self Care | Attending: Family Medicine | Admitting: Family Medicine

## 2021-04-19 ENCOUNTER — Other Ambulatory Visit: Payer: Self-pay

## 2021-04-19 DIAGNOSIS — R112 Nausea with vomiting, unspecified: Secondary | ICD-10-CM | POA: Diagnosis not present

## 2021-04-19 DIAGNOSIS — M26623 Arthralgia of bilateral temporomandibular joint: Secondary | ICD-10-CM | POA: Diagnosis not present

## 2021-04-19 MED ORDER — ONDANSETRON HCL 4 MG/2ML IJ SOLN
4.0000 mg | Freq: Once | INTRAMUSCULAR | Status: AC
  Administered 2021-04-19: 4 mg via INTRAMUSCULAR

## 2021-04-19 MED ORDER — PREDNISONE 20 MG PO TABS: ORAL_TABLET | ORAL | 0 refills | Status: AC

## 2021-04-19 MED ORDER — METHYLPREDNISOLONE SODIUM SUCC 125 MG IJ SOLR
40.0000 mg | Freq: Once | INTRAMUSCULAR | Status: AC
  Administered 2021-04-19: 40 mg via INTRAMUSCULAR

## 2021-04-19 MED ORDER — ONDANSETRON 4 MG PO TBDP: 4.0000 mg | ORAL_TABLET | Freq: Three times a day (TID) | ORAL | 0 refills | Status: AC | PRN

## 2021-04-19 MED ORDER — KETOROLAC TROMETHAMINE 30 MG/ML IJ SOLN
30.0000 mg | Freq: Once | INTRAMUSCULAR | Status: AC
  Administered 2021-04-19: 30 mg via INTRAMUSCULAR

## 2021-04-19 NOTE — Discharge Instructions (Signed)
Begin prednisone Saturday 04/20/21. Apply ice pack for to TMJ's 10 to 15 minutes, 3 to 4 times daily  Continue until pain decreases.

## 2021-04-19 NOTE — ED Notes (Signed)
Pt declined Zofran. St only phenergan helps

## 2021-04-19 NOTE — ED Provider Notes (Signed)
[name]    CSN: [id number] Arrival date & time: 04/19/21  0805      History   Chief Complaint Chief Complaint  Patient presents with  . Otalgia    HPI Brittney Gonzalez is a 60 y.o. female.   Patient complains of bilateral ear pain, radiating to her lateral neck, for about a week.  Her symptoms are worse when chewing.  She denies URI symptoms and fevers, chills, and sweats.  She states that her pain is causing nausea.  She denies bruxism and pain in her teeth.  She states that she has scheduled an appointment with ENT.  The history is provided by the patient.    Past Medical History:  Diagnosis Date  . Anxiety   . Bipolar disorder ([place])   . Diabetes mellitus type II   . GERD (gastroesophageal reflux disease)   . Hyperlipemia   . Hypertension   . TIA (transient ischemic attack)     Patient Active Problem List   Diagnosis Date Noted  . Diabetes type 2, uncontrolled ([facility]) 09/01/2016  . Marijuana use 07/04/2016  . Cystocele 07/01/2016  . Vaginal irritation 07/01/2016  . Decreased libido 05/14/2016  . PTSD (post-traumatic stress disorder) 05/14/2016  . Fatty liver disease, nonalcoholic 33/35/4562  . Vitamin D deficiency 03/12/2016  . Elevated liver enzymes 03/12/2016  . Elevated TSH 03/12/2016  . Loose stools 03/12/2016  . Controlled type 2 diabetes mellitus without complication, without long-term current use of insulin ([place]) 03/11/2016  . Bipolar 1 disorder, mixed, moderate ([place]) 03/11/2016  . COPD exacerbation ([place]) 03/11/2016  . Insomnia 03/11/2016  . Hyperlipidemia associated with type 2 diabetes mellitus ([place]) 03/11/2016  . No energy 03/11/2016  . Right shoulder pain 03/11/2016  . Atypical chest pain 03/11/2016  . Bipolar 1 disorder, mixed ([place]) 10/03/2011    Class: Chronic  . ANKLE SPRAIN, RIGHT 09/23/2010  . COUGH 06/27/2010  . ALLERGIC RHINITIS 06/19/2010  . G E R D 06/19/2010  . LUMBAR STRAIN, ACUTE 05/25/2010  . MYALGIA 03/28/2010   . DIABETES MELLITUS, TYPE II 03/11/2010  . Hyperlipidemia 03/11/2010  . Musculoskeletal disease 03/11/2010    Past Surgical History:  Procedure Laterality Date  . ABDOMINAL HYSTERECTOMY    . APPENDECTOMY    . HERNIA REPAIR    . RIGHT OOPHORECTOMY      OB History   No obstetric history on file.      Home Medications    Prior to Admission medications   Medication Sig Start Date End Date Taking? Authorizing Provider  albuterol (VENTOLIN HFA) 108 (90 Base) MCG/ACT inhaler Inhale 2 puffs into the lungs every 6 (six) hours as needed. 11/29/15  Yes [provider]  cromolyn (NASALCROM) 5.2 MG/ACT nasal spray one spray by Both Nostrils route 3 (three) times a day as needed for Allergies. 04/10/21  Yes [provider]  predniSONE (DELTASONE) 20 MG tablet Take one tab by mouth twice daily for 4 days, then one daily for 3 days. Take with food. 04/19/21  Yes Kandra Nicolas, MD  alprazolam Duanne Moron) 2 MG tablet Take 1 mg by mouth 4 (four) times daily.     [provider]  AMBULATORY NON FORMULARY MEDICATION One touch test strips and lancets. 06/26/16   Breeback, Royetta Car, PA-C  AMBULATORY NON FORMULARY MEDICATION One touch Delica 56L Lancets [id number]:G81.1 Use to test blood sugar three times daily Fax:[phone] 11/25/16   Breeback, Jade L, PA-C  amoxicillin-clavulanate (AUGMENTIN XR) 1000-62.5 MG 12 hr tablet Take  one tab BID for 7 days 09/25/19   Kandra Nicolas, MD  atorvastatin (LIPITOR) 20 MG tablet Take 1 tablet by mouth daily. 04/01/21   [provider]  Blood Glucose Monitoring Suppl (ONE TOUCH ULTRA 2) w/Device KIT Check fasting blood sugar every morning and 2 hours after one meal of the day. 01/14/17   Donella Stade, PA-C  Cetirizine HCl (ZYRTEC ALLERGY) 10 MG CAPS Take one tab PO daily for allergy symptoms 09/25/19   Kandra Nicolas, MD  furosemide (LASIX) 40 MG tablet TAKE 1 TABLET BY MOUTH DAILY AS NEEDED 07/18/16   Breeback, Jade L, PA-C  glipiZIDE  (GLUCOTROL) 10 MG tablet Take 1 tablet (10 mg total) by mouth daily before breakfast. MUST make appointment for future refills. 03/24/17   Breeback, Jade L, PA-C  guaiFENesin-codeine 100-10 MG/5ML syrup Take 37m by mouth at bedtime as needed for cough. 02/11/20   BKandra Nicolas MD  Lancets ([facility]) lancets Check fasting blood sugar every morning and 2 hours after one meal of the day. 01/14/17   Breeback, JRoyetta Car PA-C  metFORMIN (GLUCOPHAGE) 500 MG tablet Take 2 tablets (1,000 mg total) by mouth 2 (two) times daily with a meal. NEED FOLLOW UP APPOINTMENT FOR MORE REFILLS 12/02/16   Breeback, Jade L, PA-C  metFORMIN (GLUCOPHAGE) 500 MG tablet Take 2 tablets (1,000 mg total) by mouth 2 (two) times daily with a meal. NEED FOLLOW UP APPOINTMENT FOR MORE REFILLS 02/04/17   Breeback, Jade L, PA-C  montelukast (SINGULAIR) 10 MG tablet Take one tab PO HS for allergy symptoms 05/26/19   BKandra Nicolas MD  ONE TOUCH ULTRA TEST test strip USE TO TEST BLOOD SUGAR FOR A MAXIMUM OF 4 TIMES DAILY 01/08/17   BDonella Stade PA-C  promethazine (PHENERGAN) 12.5 MG tablet May take one tab PO BID prn nausea 02/11/20   BKandra Nicolas MD  traZODone (DESYREL) 150 MG tablet Take 150 mg by mouth at bedtime.    [provider]  Vitamin D, Ergocalciferol, (DRISDOL) 50000 units CAPS capsule Take 1 capsule (50,000 Units total) by mouth every 7 (seven) days. 03/12/16   BDonella Stade PA-C    Family History Family History  Problem Relation Age of Onset  . Alcohol abuse Father   . Hypertension Father   . Diabetes Father   . Aneurysm Father   . COPD Father   . COPD Mother   . Colon cancer Mother     Social History Social History   Tobacco Use  . Smoking status: Never Smoker  . Smokeless tobacco: Never Used  Vaping Use  . Vaping Use: Never used  Substance Use Topics  . Alcohol use: Yes    Comment: socially  . Drug use: No     Allergies   Celebrex [celecoxib], Ciprofloxacin, Darvon,  Hydrocodone-acetaminophen, Morphine sulfate, Oxycodone-acetaminophen, Phenergan [promethazine hcl], Propoxyphene hcl, Propoxyphene n-acetaminophen, Sulfonamide derivatives, and Tramadol hcl   Review of Systems Review of Systems ? sore throat No cough No pleuritic pain No wheezing + nasal congestion ? post-nasal drainage ? sinus pain/pressure No itchy/red eyes + earache No hemoptysis No SOB No fever/chills + nausea + vomiting No abdominal pain No diarrhea No urinary symptoms No skin rash No fatigue No myalgias No headache   Physical Exam Triage Vital Signs ED Triage Vitals  Enc Vitals Group     BP 04/19/21 0820 129/79     Pulse Rate 04/19/21 0820 70     Resp 04/19/21 0820 15  Temp 04/19/21 0820 98.7 F (37.1 C)     Temp Source 04/19/21 0820 Oral     SpO2 04/19/21 0820 98 %     Weight 04/19/21 0814 125 lb 9.6 oz (57 kg)     Height --      Head Circumference --      Peak Flow --      Pain Score 04/19/21 0814 10     Pain Loc --      Pain Edu? --      Excl. in [place]? --    No data found.  Updated Vital Signs BP 129/79 (BP Location: Right Arm)   Pulse 70   Temp 98.7 F (37.1 C) (Oral)   Resp 15   Wt 57 kg   SpO2 98%   BMI 19.10 kg/m   Visual Acuity Right Eye Distance:   Left Eye Distance:   Bilateral Distance:    Right Eye Near:   Left Eye Near:    Bilateral Near:     Physical Exam Vitals and nursing note reviewed.  Constitutional:      General: She is not in acute distress.    Appearance: She is not ill-appearing.  HENT:     Head: Normocephalic.     Right Ear: Tympanic membrane, ear canal and external ear normal. There is no impacted cerumen.     Left Ear: Tympanic membrane, ear canal and external ear normal. There is no impacted cerumen.     Ears:     Comments: There is distinct tenderness over both temporomandibular joints.  Palpation there recreates her pain.  There is mild tenderness over the post-auricular nodes.     Nose: Nose normal.  No congestion.     Comments: No sinus tenderness.    Mouth/Throat:     Mouth: Mucous membranes are moist.     Pharynx: Oropharynx is clear.  Eyes:     Conjunctiva/sclera: Conjunctivae normal.     Pupils: Pupils are equal, round, and reactive to light.  Cardiovascular:     Rate and Rhythm: Normal rate.  Pulmonary:     Effort: Pulmonary effort is normal.  Musculoskeletal:     Cervical back: Neck supple.  Lymphadenopathy:     Cervical: No cervical adenopathy.  Skin:    General: Skin is warm and dry.  Neurological:     General: No focal deficit present.     Mental Status: She is alert.      UC Treatments / Results  Labs (all labs ordered are listed, but only abnormal results are displayed) Labs Reviewed -   Tympanometry:  Right ear tympanogram normal; Left ear tympanogram normal  EKG   Radiology No results found.  Procedures Procedures (including critical care time)  Medications Ordered in UC Medications  ketorolac (TORADOL) 30 MG/ML injection 30 mg (has no administration in time range)  methylPREDNISolone sodium succinate (SOLU-MEDROL) 125 mg/2 mL injection 40 mg (has no administration in time range)  ondansetron (ZOFRAN) injection 4 mg (has no administration in time range)    Initial Impression / Assessment and Plan / UC Course  I have reviewed the triage vital signs and the nursing notes.  Pertinent labs & imaging results that were available during my care of the patient were reviewed by me and considered in my medical decision making (see chart for details).    Note normal tympanogram. There is no evidence of bacterial infection today.   Administered Solumedrol 1m IM, then begin prednisone burst/taper. Administered Zofran 472m  IM at patient's request. Administered Toradol 35m IM at patient's request. Followup with ENT as scheduled.   Final Clinical Impressions(s) / UC Diagnoses   Final diagnoses:  Bilateral temporomandibular joint pain  Non-intractable  vomiting with nausea, unspecified vomiting type     Discharge Instructions     Begin prednisone Saturday 04/20/21. Apply ice pack for to TMJ's 10 to 15 minutes, 3 to 4 times daily  Continue until pain decreases.     ED Prescriptions    Medication Sig Dispense Auth. Provider   predniSONE (DELTASONE) 20 MG tablet Take one tab by mouth twice daily for 4 days, then one daily for 3 days. Take with food. 11 tablet BKandra Nicolas MD        BKandra Nicolas MD 04/20/21 [phone]

## 2021-04-19 NOTE — ED Triage Notes (Addendum)
Pt presents with bilateral ear pain starting about 1 week ago. Pt co of eye and facial pain and drainage in throat causing throat pain as well. Pt st she has otc ear drops to help with pain. Pt st it helped ease the pain for 1 hr but thinks it could have made it worse. Pt did take ibuprofen this morning. Pt st this pain is making her nauseated.

## 2022-06-16 ENCOUNTER — Other Ambulatory Visit: Payer: Self-pay | Admitting: Physician Assistant

## 2022-06-16 DIAGNOSIS — Z1231 Encounter for screening mammogram for malignant neoplasm of breast: Secondary | ICD-10-CM

## 2022-06-26 ENCOUNTER — Ambulatory Visit: Payer: Medicare Other
